# Patient Record
Sex: Female | Born: 1950 | Race: White | Hispanic: No | Marital: Married | Smoking: Never smoker
Health system: Southern US, Community
[De-identification: ages and names within clinical notes are randomized; demographics above are authoritative.]

## PROBLEM LIST (undated history)

## (undated) DIAGNOSIS — E785 Hyperlipidemia, unspecified: Secondary | ICD-10-CM

## (undated) DIAGNOSIS — K5790 Diverticulosis of intestine, part unspecified, without perforation or abscess without bleeding: Secondary | ICD-10-CM

## (undated) DIAGNOSIS — F419 Anxiety disorder, unspecified: Secondary | ICD-10-CM

## (undated) DIAGNOSIS — F329 Major depressive disorder, single episode, unspecified: Secondary | ICD-10-CM

## (undated) DIAGNOSIS — T7840XA Allergy, unspecified, initial encounter: Secondary | ICD-10-CM

## (undated) DIAGNOSIS — K219 Gastro-esophageal reflux disease without esophagitis: Secondary | ICD-10-CM

## (undated) DIAGNOSIS — I10 Essential (primary) hypertension: Secondary | ICD-10-CM

## (undated) DIAGNOSIS — Z01419 Encounter for gynecological examination (general) (routine) without abnormal findings: Secondary | ICD-10-CM

## (undated) DIAGNOSIS — K589 Irritable bowel syndrome without diarrhea: Secondary | ICD-10-CM

## (undated) DIAGNOSIS — K649 Unspecified hemorrhoids: Secondary | ICD-10-CM

## (undated) DIAGNOSIS — K635 Polyp of colon: Secondary | ICD-10-CM

## (undated) DIAGNOSIS — F32A Depression, unspecified: Secondary | ICD-10-CM

## (undated) DIAGNOSIS — H9190 Unspecified hearing loss, unspecified ear: Secondary | ICD-10-CM

## (undated) HISTORY — DX: Gastro-esophageal reflux disease without esophagitis: K21.9

## (undated) HISTORY — DX: Allergy, unspecified, initial encounter: T78.40XA

## (undated) HISTORY — DX: Major depressive disorder, single episode, unspecified: F32.9

## (undated) HISTORY — DX: Hyperlipidemia, unspecified: E78.5

## (undated) HISTORY — DX: Encounter for gynecological examination (general) (routine) without abnormal findings: Z01.419

## (undated) HISTORY — DX: Irritable bowel syndrome, unspecified: K58.9

## (undated) HISTORY — DX: Polyp of colon: K63.5

## (undated) HISTORY — DX: Essential (primary) hypertension: I10

## (undated) HISTORY — DX: Unspecified hemorrhoids: K64.9

## (undated) HISTORY — DX: Diverticulosis of intestine, part unspecified, without perforation or abscess without bleeding: K57.90

## (undated) HISTORY — DX: Anxiety disorder, unspecified: F41.9

## (undated) HISTORY — DX: Unspecified hearing loss, unspecified ear: H91.90

## (undated) HISTORY — DX: Depression, unspecified: F32.A

## (undated) HISTORY — PX: KNEE SURGERY: SHX244

---

## 1978-11-09 HISTORY — PX: CARPAL TUNNEL RELEASE: SHX101

## 1998-09-30 ENCOUNTER — Other Ambulatory Visit: Admission: RE | Admit: 1998-09-30 | Discharge: 1998-09-30 | Payer: Self-pay | Admitting: Obstetrics & Gynecology

## 1999-06-11 ENCOUNTER — Other Ambulatory Visit: Admission: RE | Admit: 1999-06-11 | Discharge: 1999-06-11 | Payer: Self-pay | Admitting: Obstetrics and Gynecology

## 1999-06-12 ENCOUNTER — Other Ambulatory Visit: Admission: RE | Admit: 1999-06-12 | Discharge: 1999-06-12 | Payer: Self-pay | Admitting: Obstetrics and Gynecology

## 1999-12-02 ENCOUNTER — Ambulatory Visit (HOSPITAL_COMMUNITY): Admission: RE | Admit: 1999-12-02 | Discharge: 1999-12-02 | Payer: Self-pay | Admitting: Obstetrics and Gynecology

## 1999-12-02 ENCOUNTER — Encounter: Payer: Self-pay | Admitting: Obstetrics and Gynecology

## 1999-12-11 ENCOUNTER — Ambulatory Visit (HOSPITAL_COMMUNITY): Admission: RE | Admit: 1999-12-11 | Discharge: 1999-12-11 | Payer: Self-pay | Admitting: Obstetrics and Gynecology

## 2001-01-14 ENCOUNTER — Other Ambulatory Visit: Admission: RE | Admit: 2001-01-14 | Discharge: 2001-01-14 | Payer: Self-pay | Admitting: Obstetrics and Gynecology

## 2002-02-08 ENCOUNTER — Other Ambulatory Visit: Admission: RE | Admit: 2002-02-08 | Discharge: 2002-02-08 | Payer: Self-pay | Admitting: Obstetrics and Gynecology

## 2003-04-04 ENCOUNTER — Other Ambulatory Visit: Admission: RE | Admit: 2003-04-04 | Discharge: 2003-04-04 | Payer: Self-pay | Admitting: Obstetrics and Gynecology

## 2003-11-10 HISTORY — PX: DILATION AND CURETTAGE OF UTERUS: SHX78

## 2004-01-22 ENCOUNTER — Encounter: Admission: RE | Admit: 2004-01-22 | Discharge: 2004-01-22 | Payer: Self-pay | Admitting: Specialist

## 2004-04-04 ENCOUNTER — Other Ambulatory Visit: Admission: RE | Admit: 2004-04-04 | Discharge: 2004-04-04 | Payer: Self-pay | Admitting: Obstetrics and Gynecology

## 2004-11-12 ENCOUNTER — Ambulatory Visit: Payer: Self-pay | Admitting: Internal Medicine

## 2004-11-18 ENCOUNTER — Ambulatory Visit: Payer: Self-pay | Admitting: Internal Medicine

## 2004-11-24 ENCOUNTER — Ambulatory Visit: Payer: Self-pay | Admitting: Internal Medicine

## 2004-12-02 ENCOUNTER — Ambulatory Visit: Payer: Self-pay | Admitting: Internal Medicine

## 2004-12-09 ENCOUNTER — Ambulatory Visit: Payer: Self-pay | Admitting: Internal Medicine

## 2004-12-15 ENCOUNTER — Ambulatory Visit: Payer: Self-pay | Admitting: Internal Medicine

## 2004-12-22 ENCOUNTER — Ambulatory Visit: Payer: Self-pay | Admitting: Internal Medicine

## 2004-12-30 ENCOUNTER — Ambulatory Visit: Payer: Self-pay | Admitting: Internal Medicine

## 2005-01-06 ENCOUNTER — Ambulatory Visit: Payer: Self-pay | Admitting: Internal Medicine

## 2005-02-10 ENCOUNTER — Ambulatory Visit: Payer: Self-pay | Admitting: Internal Medicine

## 2005-02-16 ENCOUNTER — Ambulatory Visit: Payer: Self-pay | Admitting: Internal Medicine

## 2005-02-24 ENCOUNTER — Ambulatory Visit: Payer: Self-pay | Admitting: Internal Medicine

## 2005-03-02 ENCOUNTER — Ambulatory Visit: Payer: Self-pay | Admitting: Internal Medicine

## 2005-03-09 ENCOUNTER — Ambulatory Visit: Payer: Self-pay | Admitting: Internal Medicine

## 2005-03-16 ENCOUNTER — Ambulatory Visit: Payer: Self-pay | Admitting: Internal Medicine

## 2005-03-24 ENCOUNTER — Ambulatory Visit: Payer: Self-pay | Admitting: Internal Medicine

## 2005-03-30 ENCOUNTER — Ambulatory Visit: Payer: Self-pay | Admitting: Internal Medicine

## 2005-04-07 ENCOUNTER — Ambulatory Visit: Payer: Self-pay | Admitting: Internal Medicine

## 2005-04-13 ENCOUNTER — Ambulatory Visit: Payer: Self-pay | Admitting: Internal Medicine

## 2005-04-20 ENCOUNTER — Ambulatory Visit: Payer: Self-pay | Admitting: Internal Medicine

## 2005-04-27 ENCOUNTER — Ambulatory Visit: Payer: Self-pay | Admitting: Internal Medicine

## 2005-05-05 ENCOUNTER — Ambulatory Visit: Payer: Self-pay | Admitting: Internal Medicine

## 2005-05-13 ENCOUNTER — Ambulatory Visit: Payer: Self-pay | Admitting: Internal Medicine

## 2005-05-18 ENCOUNTER — Ambulatory Visit: Payer: Self-pay | Admitting: Internal Medicine

## 2005-05-25 ENCOUNTER — Ambulatory Visit: Payer: Self-pay | Admitting: Internal Medicine

## 2005-05-26 ENCOUNTER — Ambulatory Visit: Payer: Self-pay | Admitting: Internal Medicine

## 2005-06-01 ENCOUNTER — Ambulatory Visit: Payer: Self-pay | Admitting: Internal Medicine

## 2005-06-02 ENCOUNTER — Emergency Department (HOSPITAL_COMMUNITY): Admission: EM | Admit: 2005-06-02 | Discharge: 2005-06-02 | Payer: Self-pay | Admitting: Emergency Medicine

## 2005-06-08 ENCOUNTER — Ambulatory Visit: Payer: Self-pay | Admitting: Internal Medicine

## 2005-06-15 ENCOUNTER — Ambulatory Visit: Payer: Self-pay | Admitting: Pulmonary Disease

## 2005-06-15 ENCOUNTER — Ambulatory Visit: Payer: Self-pay | Admitting: Internal Medicine

## 2005-06-16 ENCOUNTER — Other Ambulatory Visit: Admission: RE | Admit: 2005-06-16 | Discharge: 2005-06-16 | Payer: Self-pay | Admitting: Obstetrics and Gynecology

## 2005-06-23 ENCOUNTER — Ambulatory Visit: Payer: Self-pay | Admitting: Internal Medicine

## 2005-07-02 ENCOUNTER — Ambulatory Visit: Payer: Self-pay | Admitting: Internal Medicine

## 2005-07-06 ENCOUNTER — Ambulatory Visit: Payer: Self-pay | Admitting: Internal Medicine

## 2005-07-14 ENCOUNTER — Ambulatory Visit: Payer: Self-pay | Admitting: Internal Medicine

## 2005-07-20 ENCOUNTER — Ambulatory Visit: Payer: Self-pay | Admitting: Internal Medicine

## 2005-07-27 ENCOUNTER — Ambulatory Visit: Payer: Self-pay | Admitting: Internal Medicine

## 2005-08-03 ENCOUNTER — Ambulatory Visit: Payer: Self-pay | Admitting: Internal Medicine

## 2005-08-17 ENCOUNTER — Ambulatory Visit: Payer: Self-pay | Admitting: Internal Medicine

## 2005-08-24 ENCOUNTER — Ambulatory Visit: Payer: Self-pay | Admitting: Internal Medicine

## 2005-08-31 ENCOUNTER — Ambulatory Visit: Payer: Self-pay | Admitting: Internal Medicine

## 2005-09-07 ENCOUNTER — Ambulatory Visit: Payer: Self-pay | Admitting: Internal Medicine

## 2005-09-14 ENCOUNTER — Ambulatory Visit: Payer: Self-pay | Admitting: Internal Medicine

## 2005-09-21 ENCOUNTER — Ambulatory Visit: Payer: Self-pay | Admitting: Internal Medicine

## 2005-09-28 ENCOUNTER — Ambulatory Visit: Payer: Self-pay | Admitting: Internal Medicine

## 2005-10-05 ENCOUNTER — Ambulatory Visit: Payer: Self-pay | Admitting: Internal Medicine

## 2005-10-12 ENCOUNTER — Ambulatory Visit: Payer: Self-pay | Admitting: Internal Medicine

## 2005-11-05 ENCOUNTER — Ambulatory Visit: Payer: Self-pay | Admitting: Internal Medicine

## 2005-11-09 DIAGNOSIS — K635 Polyp of colon: Secondary | ICD-10-CM

## 2005-11-09 HISTORY — DX: Polyp of colon: K63.5

## 2005-11-10 ENCOUNTER — Ambulatory Visit: Payer: Self-pay | Admitting: Internal Medicine

## 2005-11-16 ENCOUNTER — Ambulatory Visit: Payer: Self-pay | Admitting: Internal Medicine

## 2005-11-23 ENCOUNTER — Ambulatory Visit: Payer: Self-pay | Admitting: Internal Medicine

## 2005-11-30 ENCOUNTER — Ambulatory Visit: Payer: Self-pay | Admitting: Internal Medicine

## 2005-12-07 ENCOUNTER — Ambulatory Visit: Payer: Self-pay | Admitting: Internal Medicine

## 2005-12-14 ENCOUNTER — Ambulatory Visit: Payer: Self-pay | Admitting: Internal Medicine

## 2005-12-21 ENCOUNTER — Ambulatory Visit: Payer: Self-pay | Admitting: Internal Medicine

## 2005-12-28 ENCOUNTER — Ambulatory Visit: Payer: Self-pay | Admitting: Internal Medicine

## 2006-01-04 ENCOUNTER — Ambulatory Visit: Payer: Self-pay | Admitting: Internal Medicine

## 2006-01-11 ENCOUNTER — Ambulatory Visit: Payer: Self-pay | Admitting: Internal Medicine

## 2006-01-18 ENCOUNTER — Ambulatory Visit: Payer: Self-pay | Admitting: Internal Medicine

## 2006-01-25 ENCOUNTER — Ambulatory Visit: Payer: Self-pay | Admitting: Internal Medicine

## 2006-01-26 ENCOUNTER — Ambulatory Visit: Payer: Self-pay | Admitting: Internal Medicine

## 2006-02-01 ENCOUNTER — Ambulatory Visit: Payer: Self-pay | Admitting: Internal Medicine

## 2006-02-03 ENCOUNTER — Ambulatory Visit: Payer: Self-pay | Admitting: Internal Medicine

## 2006-02-08 ENCOUNTER — Ambulatory Visit: Payer: Self-pay | Admitting: Internal Medicine

## 2006-02-15 ENCOUNTER — Ambulatory Visit: Payer: Self-pay | Admitting: Internal Medicine

## 2006-02-24 ENCOUNTER — Ambulatory Visit: Payer: Self-pay | Admitting: Family Medicine

## 2006-02-24 ENCOUNTER — Ambulatory Visit: Payer: Self-pay | Admitting: Internal Medicine

## 2006-03-01 ENCOUNTER — Ambulatory Visit: Payer: Self-pay | Admitting: Internal Medicine

## 2006-03-15 ENCOUNTER — Ambulatory Visit: Payer: Self-pay | Admitting: Internal Medicine

## 2006-03-22 ENCOUNTER — Ambulatory Visit: Payer: Self-pay | Admitting: Internal Medicine

## 2006-03-29 ENCOUNTER — Ambulatory Visit: Payer: Self-pay | Admitting: Internal Medicine

## 2006-04-06 ENCOUNTER — Ambulatory Visit: Payer: Self-pay | Admitting: Internal Medicine

## 2006-04-12 ENCOUNTER — Ambulatory Visit: Payer: Self-pay | Admitting: Internal Medicine

## 2006-04-14 ENCOUNTER — Ambulatory Visit: Payer: Self-pay | Admitting: Family Medicine

## 2006-04-19 ENCOUNTER — Ambulatory Visit: Payer: Self-pay | Admitting: Internal Medicine

## 2006-04-26 ENCOUNTER — Ambulatory Visit: Payer: Self-pay | Admitting: Internal Medicine

## 2006-05-03 ENCOUNTER — Ambulatory Visit: Payer: Self-pay | Admitting: Internal Medicine

## 2006-05-10 ENCOUNTER — Ambulatory Visit: Payer: Self-pay | Admitting: Internal Medicine

## 2006-05-17 ENCOUNTER — Ambulatory Visit: Payer: Self-pay | Admitting: Internal Medicine

## 2006-05-24 ENCOUNTER — Ambulatory Visit: Payer: Self-pay | Admitting: Internal Medicine

## 2006-05-25 ENCOUNTER — Ambulatory Visit: Payer: Self-pay | Admitting: Internal Medicine

## 2006-05-31 ENCOUNTER — Ambulatory Visit: Payer: Self-pay | Admitting: Internal Medicine

## 2006-06-07 ENCOUNTER — Ambulatory Visit: Payer: Self-pay | Admitting: Internal Medicine

## 2006-06-14 ENCOUNTER — Ambulatory Visit: Payer: Self-pay | Admitting: Internal Medicine

## 2006-06-21 ENCOUNTER — Ambulatory Visit: Payer: Self-pay | Admitting: Internal Medicine

## 2006-06-28 ENCOUNTER — Ambulatory Visit: Payer: Self-pay | Admitting: Internal Medicine

## 2006-07-05 ENCOUNTER — Ambulatory Visit: Payer: Self-pay | Admitting: Internal Medicine

## 2006-07-13 ENCOUNTER — Ambulatory Visit: Payer: Self-pay | Admitting: Internal Medicine

## 2006-07-19 ENCOUNTER — Ambulatory Visit: Payer: Self-pay | Admitting: Internal Medicine

## 2006-07-20 ENCOUNTER — Emergency Department (HOSPITAL_COMMUNITY): Admission: EM | Admit: 2006-07-20 | Discharge: 2006-07-20 | Payer: Self-pay | Admitting: Emergency Medicine

## 2006-07-26 ENCOUNTER — Ambulatory Visit: Payer: Self-pay | Admitting: Internal Medicine

## 2006-08-02 ENCOUNTER — Ambulatory Visit: Payer: Self-pay | Admitting: Internal Medicine

## 2006-08-09 ENCOUNTER — Ambulatory Visit: Payer: Self-pay | Admitting: Internal Medicine

## 2006-08-16 ENCOUNTER — Ambulatory Visit: Payer: Self-pay | Admitting: Internal Medicine

## 2006-08-17 ENCOUNTER — Ambulatory Visit: Payer: Self-pay | Admitting: Family Medicine

## 2006-08-19 ENCOUNTER — Ambulatory Visit: Payer: Self-pay | Admitting: Family Medicine

## 2006-08-19 LAB — CONVERTED CEMR LAB
BUN: 15 mg/dL (ref 6–23)
Basophils Absolute: 0.1 10*3/uL (ref 0.0–0.1)
CO2: 29 meq/L (ref 19–32)
Calcium: 9.2 mg/dL (ref 8.4–10.5)
Chloride: 108 meq/L (ref 96–112)
Chol/HDL Ratio, serum: 5.1
Cholesterol: 193 mg/dL (ref 0–200)
Creatinine, Ser: 0.9 mg/dL (ref 0.4–1.2)
Hemoglobin: 12.7 g/dL (ref 12.0–15.0)
LDL Cholesterol: 120 mg/dL — ABNORMAL HIGH (ref 0–99)
Lymphocytes Relative: 25.3 % (ref 12.0–46.0)
MCHC: 33.9 g/dL (ref 30.0–36.0)
MCV: 85.7 fL (ref 78.0–100.0)
Monocytes Absolute: 0.4 10*3/uL (ref 0.2–0.7)
Monocytes Relative: 6.9 % (ref 3.0–11.0)
Neutrophils Relative %: 63.4 % (ref 43.0–77.0)
Potassium: 3.4 meq/L — ABNORMAL LOW (ref 3.5–5.1)
TSH: 2.03 microintl units/mL (ref 0.35–5.50)
Triglyceride fasting, serum: 173 mg/dL — ABNORMAL HIGH (ref 0–149)

## 2006-08-23 ENCOUNTER — Ambulatory Visit: Payer: Self-pay | Admitting: Internal Medicine

## 2006-08-26 ENCOUNTER — Ambulatory Visit: Payer: Self-pay | Admitting: Family Medicine

## 2006-08-30 ENCOUNTER — Ambulatory Visit: Payer: Self-pay | Admitting: Internal Medicine

## 2006-09-06 ENCOUNTER — Ambulatory Visit: Payer: Self-pay | Admitting: Internal Medicine

## 2006-09-13 ENCOUNTER — Ambulatory Visit: Payer: Self-pay | Admitting: Internal Medicine

## 2006-09-14 ENCOUNTER — Ambulatory Visit: Payer: Self-pay | Admitting: Internal Medicine

## 2006-09-20 ENCOUNTER — Ambulatory Visit: Payer: Self-pay | Admitting: Internal Medicine

## 2006-09-27 ENCOUNTER — Ambulatory Visit: Payer: Self-pay | Admitting: Internal Medicine

## 2006-10-07 ENCOUNTER — Ambulatory Visit: Payer: Self-pay | Admitting: Internal Medicine

## 2006-10-11 ENCOUNTER — Ambulatory Visit: Payer: Self-pay | Admitting: Internal Medicine

## 2006-10-18 ENCOUNTER — Ambulatory Visit: Payer: Self-pay | Admitting: Internal Medicine

## 2006-10-25 ENCOUNTER — Ambulatory Visit: Payer: Self-pay | Admitting: Internal Medicine

## 2006-11-03 ENCOUNTER — Ambulatory Visit: Payer: Self-pay | Admitting: Internal Medicine

## 2006-11-08 ENCOUNTER — Ambulatory Visit: Payer: Self-pay | Admitting: Internal Medicine

## 2006-11-15 ENCOUNTER — Ambulatory Visit: Payer: Self-pay | Admitting: Internal Medicine

## 2006-11-22 ENCOUNTER — Ambulatory Visit: Payer: Self-pay | Admitting: Internal Medicine

## 2006-11-29 ENCOUNTER — Ambulatory Visit: Payer: Self-pay | Admitting: Internal Medicine

## 2006-12-06 ENCOUNTER — Ambulatory Visit: Payer: Self-pay | Admitting: Internal Medicine

## 2006-12-07 ENCOUNTER — Ambulatory Visit: Payer: Self-pay | Admitting: Family Medicine

## 2006-12-10 ENCOUNTER — Ambulatory Visit: Payer: Self-pay | Admitting: Gastroenterology

## 2006-12-13 ENCOUNTER — Ambulatory Visit: Payer: Self-pay | Admitting: Internal Medicine

## 2006-12-20 ENCOUNTER — Ambulatory Visit: Payer: Self-pay | Admitting: Internal Medicine

## 2006-12-24 ENCOUNTER — Ambulatory Visit: Payer: Self-pay | Admitting: Family Medicine

## 2006-12-27 ENCOUNTER — Ambulatory Visit: Payer: Self-pay | Admitting: Internal Medicine

## 2006-12-28 ENCOUNTER — Ambulatory Visit: Payer: Self-pay | Admitting: Internal Medicine

## 2007-01-03 ENCOUNTER — Ambulatory Visit: Payer: Self-pay | Admitting: Internal Medicine

## 2007-01-10 ENCOUNTER — Ambulatory Visit: Payer: Self-pay | Admitting: Internal Medicine

## 2007-01-17 ENCOUNTER — Ambulatory Visit: Payer: Self-pay | Admitting: Internal Medicine

## 2007-01-24 ENCOUNTER — Ambulatory Visit: Payer: Self-pay | Admitting: Internal Medicine

## 2007-02-14 ENCOUNTER — Ambulatory Visit: Payer: Self-pay | Admitting: Internal Medicine

## 2007-02-21 ENCOUNTER — Ambulatory Visit: Payer: Self-pay | Admitting: Internal Medicine

## 2007-02-28 ENCOUNTER — Ambulatory Visit: Payer: Self-pay | Admitting: Internal Medicine

## 2007-03-07 ENCOUNTER — Ambulatory Visit: Payer: Self-pay | Admitting: Internal Medicine

## 2007-03-14 ENCOUNTER — Ambulatory Visit: Payer: Self-pay | Admitting: Internal Medicine

## 2007-03-21 ENCOUNTER — Ambulatory Visit: Payer: Self-pay | Admitting: Internal Medicine

## 2007-03-30 ENCOUNTER — Ambulatory Visit: Payer: Self-pay | Admitting: Internal Medicine

## 2007-04-05 ENCOUNTER — Ambulatory Visit: Payer: Self-pay | Admitting: Internal Medicine

## 2007-04-11 ENCOUNTER — Ambulatory Visit: Payer: Self-pay | Admitting: Internal Medicine

## 2007-04-25 ENCOUNTER — Ambulatory Visit: Payer: Self-pay | Admitting: Internal Medicine

## 2007-05-02 ENCOUNTER — Ambulatory Visit: Payer: Self-pay | Admitting: Internal Medicine

## 2007-05-03 ENCOUNTER — Ambulatory Visit: Payer: Self-pay | Admitting: Internal Medicine

## 2007-05-06 ENCOUNTER — Ambulatory Visit: Payer: Self-pay | Admitting: Family Medicine

## 2007-05-09 ENCOUNTER — Ambulatory Visit: Payer: Self-pay | Admitting: Internal Medicine

## 2007-05-16 ENCOUNTER — Ambulatory Visit: Payer: Self-pay | Admitting: Internal Medicine

## 2007-05-23 ENCOUNTER — Ambulatory Visit: Payer: Self-pay | Admitting: Internal Medicine

## 2007-05-23 DIAGNOSIS — K219 Gastro-esophageal reflux disease without esophagitis: Secondary | ICD-10-CM | POA: Insufficient documentation

## 2007-05-23 DIAGNOSIS — J309 Allergic rhinitis, unspecified: Secondary | ICD-10-CM | POA: Insufficient documentation

## 2007-05-23 DIAGNOSIS — J4520 Mild intermittent asthma, uncomplicated: Secondary | ICD-10-CM | POA: Insufficient documentation

## 2007-05-23 DIAGNOSIS — I10 Essential (primary) hypertension: Secondary | ICD-10-CM | POA: Insufficient documentation

## 2007-06-06 ENCOUNTER — Ambulatory Visit: Payer: Self-pay | Admitting: Internal Medicine

## 2007-06-14 ENCOUNTER — Telehealth: Payer: Self-pay | Admitting: Family Medicine

## 2007-06-15 ENCOUNTER — Ambulatory Visit: Payer: Self-pay | Admitting: Family Medicine

## 2007-06-20 ENCOUNTER — Ambulatory Visit: Payer: Self-pay | Admitting: Internal Medicine

## 2007-07-04 ENCOUNTER — Ambulatory Visit: Payer: Self-pay | Admitting: Internal Medicine

## 2007-07-04 ENCOUNTER — Ambulatory Visit: Payer: Self-pay | Admitting: Family Medicine

## 2007-07-12 ENCOUNTER — Ambulatory Visit: Payer: Self-pay | Admitting: Internal Medicine

## 2007-07-18 ENCOUNTER — Ambulatory Visit: Payer: Self-pay | Admitting: Internal Medicine

## 2007-07-25 ENCOUNTER — Ambulatory Visit: Payer: Self-pay | Admitting: Internal Medicine

## 2007-08-01 ENCOUNTER — Ambulatory Visit: Payer: Self-pay | Admitting: Internal Medicine

## 2007-08-08 ENCOUNTER — Ambulatory Visit: Payer: Self-pay | Admitting: Cardiovascular Disease

## 2007-08-08 ENCOUNTER — Ambulatory Visit: Payer: Self-pay | Admitting: Family Medicine

## 2007-08-11 ENCOUNTER — Telehealth: Payer: Self-pay | Admitting: Family Medicine

## 2007-08-15 ENCOUNTER — Ambulatory Visit: Payer: Self-pay | Admitting: Internal Medicine

## 2007-08-22 ENCOUNTER — Ambulatory Visit: Payer: Self-pay | Admitting: Internal Medicine

## 2007-09-05 ENCOUNTER — Ambulatory Visit: Payer: Self-pay | Admitting: Internal Medicine

## 2007-09-12 ENCOUNTER — Ambulatory Visit: Payer: Self-pay | Admitting: Internal Medicine

## 2007-09-19 ENCOUNTER — Ambulatory Visit: Payer: Self-pay | Admitting: Internal Medicine

## 2007-09-20 ENCOUNTER — Ambulatory Visit: Payer: Self-pay | Admitting: Internal Medicine

## 2007-09-26 ENCOUNTER — Telehealth: Payer: Self-pay | Admitting: Family Medicine

## 2007-10-03 ENCOUNTER — Ambulatory Visit: Payer: Self-pay | Admitting: Internal Medicine

## 2007-10-12 ENCOUNTER — Ambulatory Visit: Payer: Self-pay | Admitting: Internal Medicine

## 2007-10-12 ENCOUNTER — Encounter: Payer: Self-pay | Admitting: Internal Medicine

## 2007-10-25 ENCOUNTER — Ambulatory Visit: Payer: Self-pay | Admitting: Family Medicine

## 2007-11-14 ENCOUNTER — Ambulatory Visit: Payer: Self-pay | Admitting: Internal Medicine

## 2007-11-28 ENCOUNTER — Ambulatory Visit: Payer: Self-pay | Admitting: Internal Medicine

## 2007-12-13 ENCOUNTER — Ambulatory Visit: Payer: Self-pay | Admitting: Internal Medicine

## 2007-12-17 ENCOUNTER — Emergency Department (HOSPITAL_COMMUNITY): Admission: EM | Admit: 2007-12-17 | Discharge: 2007-12-17 | Payer: Self-pay | Admitting: Family Medicine

## 2007-12-19 ENCOUNTER — Telehealth: Payer: Self-pay | Admitting: Family Medicine

## 2007-12-22 ENCOUNTER — Telehealth: Payer: Self-pay | Admitting: Family Medicine

## 2007-12-27 ENCOUNTER — Ambulatory Visit: Payer: Self-pay | Admitting: Internal Medicine

## 2007-12-30 ENCOUNTER — Ambulatory Visit: Payer: Self-pay | Admitting: Family Medicine

## 2007-12-30 ENCOUNTER — Telehealth (INDEPENDENT_AMBULATORY_CARE_PROVIDER_SITE_OTHER): Payer: Self-pay | Admitting: *Deleted

## 2008-01-19 ENCOUNTER — Ambulatory Visit: Payer: Self-pay | Admitting: Internal Medicine

## 2008-01-26 ENCOUNTER — Telehealth (INDEPENDENT_AMBULATORY_CARE_PROVIDER_SITE_OTHER): Payer: Self-pay | Admitting: *Deleted

## 2008-01-27 ENCOUNTER — Ambulatory Visit: Payer: Self-pay | Admitting: Internal Medicine

## 2008-02-08 ENCOUNTER — Ambulatory Visit: Payer: Self-pay | Admitting: Family Medicine

## 2008-02-08 DIAGNOSIS — R51 Headache: Secondary | ICD-10-CM | POA: Insufficient documentation

## 2008-02-08 DIAGNOSIS — R209 Unspecified disturbances of skin sensation: Secondary | ICD-10-CM | POA: Insufficient documentation

## 2008-02-08 DIAGNOSIS — R42 Dizziness and giddiness: Secondary | ICD-10-CM | POA: Insufficient documentation

## 2008-02-08 DIAGNOSIS — R519 Headache, unspecified: Secondary | ICD-10-CM | POA: Insufficient documentation

## 2008-02-10 ENCOUNTER — Ambulatory Visit: Payer: Self-pay | Admitting: Family Medicine

## 2008-02-14 ENCOUNTER — Encounter: Admission: RE | Admit: 2008-02-14 | Discharge: 2008-02-14 | Payer: Self-pay | Admitting: Family Medicine

## 2008-02-14 LAB — CONVERTED CEMR LAB
BUN: 18 mg/dL (ref 6–23)
GFR calc Af Amer: 84 mL/min
Glucose, Bld: 100 mg/dL — ABNORMAL HIGH (ref 70–99)
Potassium: 3.9 meq/L (ref 3.5–5.1)

## 2008-02-15 ENCOUNTER — Ambulatory Visit: Payer: Self-pay | Admitting: Internal Medicine

## 2008-02-24 ENCOUNTER — Ambulatory Visit: Payer: Self-pay | Admitting: Internal Medicine

## 2008-03-08 ENCOUNTER — Ambulatory Visit: Payer: Self-pay | Admitting: Internal Medicine

## 2008-03-13 ENCOUNTER — Emergency Department (HOSPITAL_COMMUNITY): Admission: EM | Admit: 2008-03-13 | Discharge: 2008-03-13 | Payer: Self-pay | Admitting: Emergency Medicine

## 2008-03-15 ENCOUNTER — Ambulatory Visit: Payer: Self-pay | Admitting: Family Medicine

## 2008-03-16 ENCOUNTER — Ambulatory Visit: Payer: Self-pay | Admitting: Internal Medicine

## 2008-03-23 ENCOUNTER — Ambulatory Visit: Payer: Self-pay | Admitting: Internal Medicine

## 2008-03-30 ENCOUNTER — Ambulatory Visit: Payer: Self-pay | Admitting: Internal Medicine

## 2008-04-06 ENCOUNTER — Ambulatory Visit: Payer: Self-pay | Admitting: Internal Medicine

## 2008-04-11 ENCOUNTER — Ambulatory Visit: Payer: Self-pay | Admitting: Internal Medicine

## 2008-04-24 ENCOUNTER — Ambulatory Visit: Payer: Self-pay | Admitting: Internal Medicine

## 2008-04-25 ENCOUNTER — Ambulatory Visit: Payer: Self-pay | Admitting: Internal Medicine

## 2008-05-04 ENCOUNTER — Ambulatory Visit: Payer: Self-pay | Admitting: Internal Medicine

## 2008-05-10 ENCOUNTER — Ambulatory Visit: Payer: Self-pay | Admitting: Internal Medicine

## 2008-05-28 ENCOUNTER — Ambulatory Visit: Payer: Self-pay | Admitting: Internal Medicine

## 2008-06-08 ENCOUNTER — Ambulatory Visit: Payer: Self-pay | Admitting: Internal Medicine

## 2008-06-15 ENCOUNTER — Ambulatory Visit: Payer: Self-pay | Admitting: Internal Medicine

## 2008-06-22 ENCOUNTER — Ambulatory Visit: Payer: Self-pay | Admitting: Internal Medicine

## 2008-07-13 ENCOUNTER — Ambulatory Visit: Payer: Self-pay | Admitting: Internal Medicine

## 2008-07-20 ENCOUNTER — Ambulatory Visit: Payer: Self-pay | Admitting: Internal Medicine

## 2008-07-27 ENCOUNTER — Ambulatory Visit: Payer: Self-pay | Admitting: Internal Medicine

## 2008-08-03 ENCOUNTER — Ambulatory Visit: Payer: Self-pay | Admitting: Internal Medicine

## 2008-08-10 ENCOUNTER — Ambulatory Visit: Payer: Self-pay | Admitting: Internal Medicine

## 2008-08-17 ENCOUNTER — Ambulatory Visit: Payer: Self-pay | Admitting: Internal Medicine

## 2008-08-24 ENCOUNTER — Ambulatory Visit: Payer: Self-pay | Admitting: Internal Medicine

## 2008-09-07 ENCOUNTER — Ambulatory Visit: Payer: Self-pay | Admitting: Internal Medicine

## 2008-09-10 ENCOUNTER — Ambulatory Visit: Payer: Self-pay | Admitting: Internal Medicine

## 2008-09-11 ENCOUNTER — Ambulatory Visit: Payer: Self-pay | Admitting: Internal Medicine

## 2008-09-18 ENCOUNTER — Ambulatory Visit: Payer: Self-pay | Admitting: Internal Medicine

## 2008-09-28 ENCOUNTER — Ambulatory Visit: Payer: Self-pay | Admitting: Internal Medicine

## 2008-10-18 ENCOUNTER — Ambulatory Visit: Payer: Self-pay | Admitting: Internal Medicine

## 2008-11-05 ENCOUNTER — Ambulatory Visit: Payer: Self-pay | Admitting: Internal Medicine

## 2008-11-05 DIAGNOSIS — J040 Acute laryngitis: Secondary | ICD-10-CM | POA: Insufficient documentation

## 2008-11-16 ENCOUNTER — Telehealth: Payer: Self-pay | Admitting: *Deleted

## 2008-11-20 ENCOUNTER — Ambulatory Visit: Payer: Self-pay | Admitting: Internal Medicine

## 2008-11-20 ENCOUNTER — Telehealth: Payer: Self-pay | Admitting: Family Medicine

## 2008-11-20 ENCOUNTER — Ambulatory Visit: Payer: Self-pay | Admitting: Family Medicine

## 2008-11-20 DIAGNOSIS — F3289 Other specified depressive episodes: Secondary | ICD-10-CM | POA: Insufficient documentation

## 2008-11-20 DIAGNOSIS — F329 Major depressive disorder, single episode, unspecified: Secondary | ICD-10-CM

## 2008-11-21 LAB — CONVERTED CEMR LAB
AST: 15 units/L (ref 0–37)
Albumin: 3.7 g/dL (ref 3.5–5.2)
Alkaline Phosphatase: 63 units/L (ref 39–117)
BUN: 15 mg/dL (ref 6–23)
CO2: 27 meq/L (ref 19–32)
Chloride: 108 meq/L (ref 96–112)
Eosinophils Relative: 2.3 % (ref 0.0–5.0)
Glucose, Bld: 96 mg/dL (ref 70–99)
HCT: 37 % (ref 36.0–46.0)
HDL: 43.2 mg/dL (ref >39.0)
Monocytes Relative: 6.1 % (ref 3.0–12.0)
Neutrophils Relative %: 65.8 % (ref 43.0–77.0)
Platelets: 201 10*3/uL (ref 150–400)
Potassium: 3.9 meq/L (ref 3.5–5.1)
Total CHOL/HDL Ratio: 4.5
Total Protein: 6.6 g/dL (ref 6.0–8.3)
VLDL: 29 mg/dL (ref 0–40)
WBC: 6.8 10*3/uL (ref 4.5–10.5)

## 2008-11-30 ENCOUNTER — Telehealth: Payer: Self-pay | Admitting: Internal Medicine

## 2008-11-30 ENCOUNTER — Encounter: Payer: Self-pay | Admitting: Adult Health

## 2008-11-30 ENCOUNTER — Ambulatory Visit: Payer: Self-pay | Admitting: Internal Medicine

## 2008-12-03 ENCOUNTER — Telehealth (INDEPENDENT_AMBULATORY_CARE_PROVIDER_SITE_OTHER): Payer: Self-pay | Admitting: *Deleted

## 2008-12-04 DIAGNOSIS — J069 Acute upper respiratory infection, unspecified: Secondary | ICD-10-CM | POA: Insufficient documentation

## 2008-12-05 ENCOUNTER — Ambulatory Visit: Payer: Self-pay | Admitting: Family Medicine

## 2008-12-07 ENCOUNTER — Ambulatory Visit: Payer: Self-pay | Admitting: Internal Medicine

## 2008-12-13 ENCOUNTER — Ambulatory Visit: Payer: Self-pay | Admitting: Internal Medicine

## 2008-12-21 ENCOUNTER — Ambulatory Visit: Payer: Self-pay | Admitting: Internal Medicine

## 2008-12-28 ENCOUNTER — Ambulatory Visit: Payer: Self-pay | Admitting: Internal Medicine

## 2009-01-11 ENCOUNTER — Ambulatory Visit: Payer: Self-pay | Admitting: Internal Medicine

## 2009-01-18 ENCOUNTER — Ambulatory Visit: Payer: Self-pay | Admitting: Internal Medicine

## 2009-01-25 ENCOUNTER — Ambulatory Visit: Payer: Self-pay | Admitting: Internal Medicine

## 2009-02-01 ENCOUNTER — Ambulatory Visit: Payer: Self-pay | Admitting: Internal Medicine

## 2009-02-15 ENCOUNTER — Ambulatory Visit: Payer: Self-pay | Admitting: Internal Medicine

## 2009-02-22 ENCOUNTER — Ambulatory Visit: Payer: Self-pay | Admitting: Internal Medicine

## 2009-02-25 ENCOUNTER — Ambulatory Visit: Payer: Self-pay | Admitting: Internal Medicine

## 2009-03-08 ENCOUNTER — Ambulatory Visit: Payer: Self-pay | Admitting: Internal Medicine

## 2009-03-11 ENCOUNTER — Ambulatory Visit: Payer: Self-pay | Admitting: Internal Medicine

## 2009-03-29 ENCOUNTER — Ambulatory Visit: Payer: Self-pay | Admitting: Internal Medicine

## 2009-04-05 ENCOUNTER — Ambulatory Visit: Payer: Self-pay | Admitting: Internal Medicine

## 2009-04-12 ENCOUNTER — Ambulatory Visit: Payer: Self-pay | Admitting: Internal Medicine

## 2009-04-26 ENCOUNTER — Ambulatory Visit: Payer: Self-pay | Admitting: Internal Medicine

## 2009-05-03 ENCOUNTER — Ambulatory Visit: Payer: Self-pay | Admitting: Internal Medicine

## 2009-05-10 ENCOUNTER — Ambulatory Visit: Payer: Self-pay | Admitting: Internal Medicine

## 2009-05-15 ENCOUNTER — Telehealth: Payer: Self-pay | Admitting: Family Medicine

## 2009-05-17 ENCOUNTER — Ambulatory Visit: Payer: Self-pay | Admitting: Internal Medicine

## 2009-05-23 ENCOUNTER — Ambulatory Visit: Payer: Self-pay | Admitting: Internal Medicine

## 2009-05-27 ENCOUNTER — Telehealth (INDEPENDENT_AMBULATORY_CARE_PROVIDER_SITE_OTHER): Payer: Self-pay | Admitting: *Deleted

## 2009-05-29 ENCOUNTER — Telehealth (INDEPENDENT_AMBULATORY_CARE_PROVIDER_SITE_OTHER): Payer: Self-pay | Admitting: *Deleted

## 2009-05-31 ENCOUNTER — Ambulatory Visit: Payer: Self-pay | Admitting: Internal Medicine

## 2009-06-07 ENCOUNTER — Ambulatory Visit: Payer: Self-pay | Admitting: Internal Medicine

## 2009-06-12 ENCOUNTER — Ambulatory Visit: Payer: Self-pay | Admitting: Internal Medicine

## 2009-06-27 ENCOUNTER — Ambulatory Visit: Payer: Self-pay | Admitting: Internal Medicine

## 2009-06-27 LAB — HM COLONOSCOPY

## 2009-07-05 ENCOUNTER — Ambulatory Visit: Payer: Self-pay | Admitting: Internal Medicine

## 2009-07-12 ENCOUNTER — Ambulatory Visit: Payer: Self-pay | Admitting: Internal Medicine

## 2009-07-19 ENCOUNTER — Ambulatory Visit: Payer: Self-pay | Admitting: Internal Medicine

## 2009-07-26 ENCOUNTER — Ambulatory Visit: Payer: Self-pay | Admitting: Internal Medicine

## 2009-07-30 ENCOUNTER — Ambulatory Visit: Payer: Self-pay | Admitting: Internal Medicine

## 2009-07-30 ENCOUNTER — Telehealth: Payer: Self-pay | Admitting: Internal Medicine

## 2009-08-02 ENCOUNTER — Ambulatory Visit: Payer: Self-pay | Admitting: Internal Medicine

## 2009-08-09 ENCOUNTER — Ambulatory Visit: Payer: Self-pay | Admitting: Internal Medicine

## 2009-08-16 ENCOUNTER — Ambulatory Visit: Payer: Self-pay | Admitting: Internal Medicine

## 2009-08-23 ENCOUNTER — Ambulatory Visit: Payer: Self-pay | Admitting: Internal Medicine

## 2009-09-06 ENCOUNTER — Ambulatory Visit: Payer: Self-pay | Admitting: Internal Medicine

## 2009-09-09 ENCOUNTER — Ambulatory Visit: Payer: Self-pay | Admitting: Internal Medicine

## 2009-09-20 ENCOUNTER — Ambulatory Visit: Payer: Self-pay | Admitting: Internal Medicine

## 2009-10-11 ENCOUNTER — Ambulatory Visit: Payer: Self-pay | Admitting: Internal Medicine

## 2009-10-25 ENCOUNTER — Ambulatory Visit: Payer: Self-pay | Admitting: Internal Medicine

## 2009-11-05 ENCOUNTER — Ambulatory Visit: Payer: Self-pay | Admitting: Internal Medicine

## 2009-11-15 ENCOUNTER — Ambulatory Visit: Payer: Self-pay | Admitting: Internal Medicine

## 2009-11-26 ENCOUNTER — Ambulatory Visit: Payer: Self-pay | Admitting: Family Medicine

## 2009-11-26 LAB — CONVERTED CEMR LAB
Nitrite: NEGATIVE
Urobilinogen, UA: 0.2

## 2009-11-28 LAB — CONVERTED CEMR LAB
ALT: 18 units/L (ref 0–35)
AST: 19 units/L (ref 0–37)
Alkaline Phosphatase: 58 units/L (ref 39–117)
Basophils Relative: 0.6 % (ref 0.0–3.0)
Bilirubin, Direct: 0.1 mg/dL (ref 0.0–0.3)
Chloride: 111 meq/L (ref 96–112)
Creatinine, Ser: 0.9 mg/dL (ref 0.4–1.2)
Eosinophils Relative: 2.7 % (ref 0.0–5.0)
GFR calc non Af Amer: 68.65 mL/min (ref >60)
LDL Cholesterol: 118 mg/dL — ABNORMAL HIGH (ref 0–99)
Lymphocytes Relative: 29.4 % (ref 12.0–46.0)
MCV: 90.3 fL (ref 78.0–100.0)
Monocytes Absolute: 0.4 10*3/uL (ref 0.1–1.0)
Monocytes Relative: 5.8 % (ref 3.0–12.0)
Neutrophils Relative %: 61.5 % (ref 43.0–77.0)
RBC: 4.08 M/uL (ref 3.87–5.11)
Total Bilirubin: 0.7 mg/dL (ref 0.3–1.2)
Total CHOL/HDL Ratio: 5
Total Protein: 6.4 g/dL (ref 6.0–8.3)
Triglycerides: 199 mg/dL — ABNORMAL HIGH (ref 0.0–149.0)
VLDL: 39.8 mg/dL (ref 0.0–40.0)
WBC: 6.1 10*3/uL (ref 4.5–10.5)

## 2009-12-03 ENCOUNTER — Ambulatory Visit: Payer: Self-pay | Admitting: Family Medicine

## 2009-12-11 ENCOUNTER — Ambulatory Visit: Payer: Self-pay | Admitting: Family Medicine

## 2009-12-16 ENCOUNTER — Ambulatory Visit: Payer: Self-pay | Admitting: Internal Medicine

## 2009-12-27 ENCOUNTER — Ambulatory Visit: Payer: Self-pay | Admitting: Internal Medicine

## 2010-01-03 ENCOUNTER — Ambulatory Visit: Payer: Self-pay | Admitting: Internal Medicine

## 2010-01-10 ENCOUNTER — Ambulatory Visit: Payer: Self-pay | Admitting: Internal Medicine

## 2010-01-10 ENCOUNTER — Telehealth (INDEPENDENT_AMBULATORY_CARE_PROVIDER_SITE_OTHER): Payer: Self-pay | Admitting: *Deleted

## 2010-01-17 ENCOUNTER — Ambulatory Visit: Payer: Self-pay | Admitting: Internal Medicine

## 2010-01-24 ENCOUNTER — Ambulatory Visit: Payer: Self-pay | Admitting: Internal Medicine

## 2010-01-27 ENCOUNTER — Ambulatory Visit: Payer: Self-pay | Admitting: Internal Medicine

## 2010-01-31 ENCOUNTER — Ambulatory Visit: Payer: Self-pay | Admitting: Internal Medicine

## 2010-02-07 ENCOUNTER — Ambulatory Visit: Payer: Self-pay | Admitting: Internal Medicine

## 2010-02-21 ENCOUNTER — Ambulatory Visit: Payer: Self-pay | Admitting: Internal Medicine

## 2010-03-07 ENCOUNTER — Ambulatory Visit: Payer: Self-pay | Admitting: Internal Medicine

## 2010-03-10 ENCOUNTER — Ambulatory Visit: Payer: Self-pay | Admitting: Internal Medicine

## 2010-03-11 ENCOUNTER — Encounter: Payer: Self-pay | Admitting: Family Medicine

## 2010-03-21 ENCOUNTER — Ambulatory Visit: Payer: Self-pay | Admitting: Internal Medicine

## 2010-03-28 ENCOUNTER — Ambulatory Visit: Payer: Self-pay | Admitting: Internal Medicine

## 2010-04-03 ENCOUNTER — Ambulatory Visit: Payer: Self-pay | Admitting: Family Medicine

## 2010-04-07 ENCOUNTER — Encounter: Payer: Self-pay | Admitting: Family Medicine

## 2010-04-11 ENCOUNTER — Ambulatory Visit: Payer: Self-pay | Admitting: Internal Medicine

## 2010-05-02 ENCOUNTER — Ambulatory Visit: Payer: Self-pay | Admitting: Internal Medicine

## 2010-05-23 ENCOUNTER — Ambulatory Visit: Payer: Self-pay | Admitting: Internal Medicine

## 2010-05-30 ENCOUNTER — Ambulatory Visit: Payer: Self-pay | Admitting: Internal Medicine

## 2010-06-06 ENCOUNTER — Ambulatory Visit: Payer: Self-pay | Admitting: Internal Medicine

## 2010-06-20 ENCOUNTER — Ambulatory Visit: Payer: Self-pay | Admitting: Internal Medicine

## 2010-07-11 ENCOUNTER — Ambulatory Visit: Payer: Self-pay | Admitting: Internal Medicine

## 2010-07-18 ENCOUNTER — Ambulatory Visit: Payer: Self-pay | Admitting: Internal Medicine

## 2010-07-25 ENCOUNTER — Ambulatory Visit: Payer: Self-pay | Admitting: Internal Medicine

## 2010-08-07 ENCOUNTER — Telehealth: Payer: Self-pay

## 2010-08-22 ENCOUNTER — Ambulatory Visit: Payer: Self-pay | Admitting: Internal Medicine

## 2010-08-25 ENCOUNTER — Ambulatory Visit: Payer: Self-pay | Admitting: Family Medicine

## 2010-08-25 ENCOUNTER — Ambulatory Visit: Payer: Self-pay | Admitting: Internal Medicine

## 2010-09-05 ENCOUNTER — Ambulatory Visit: Payer: Self-pay | Admitting: Internal Medicine

## 2010-09-08 ENCOUNTER — Ambulatory Visit: Payer: Self-pay | Admitting: Internal Medicine

## 2010-09-19 ENCOUNTER — Ambulatory Visit: Payer: Self-pay | Admitting: Internal Medicine

## 2010-09-26 ENCOUNTER — Ambulatory Visit: Payer: Self-pay | Admitting: Internal Medicine

## 2010-11-04 ENCOUNTER — Ambulatory Visit: Payer: Self-pay | Admitting: Internal Medicine

## 2010-11-22 ENCOUNTER — Ambulatory Visit: Payer: Self-pay | Admitting: Internal Medicine

## 2010-12-11 NOTE — Assessment & Plan Note (Signed)
Summary: COUGH, CONGESTION, WHEEZING // RS   Vital Signs:  Patient profile:   60 year old female Temp:     98.0 degrees F oral Pulse rate:   81 / minute BP sitting:   106 / 62  (left arm) Cuff size:   large  Vitals Entered By: Alfred Levins, CMA (December 11, 2009 11:30 AM) CC: cough, runny nose x2 days   History of Present Illness: Here for 3 days of stuffy head, PND, ST, chest congestion, and a dry cough. No fever.   Current Medications (verified): 1)  Omeprazole 20 Mg Cpdr (Omeprazole) .... Once Daily 2)  Klor-Con M20 20 Meq Tbcr (Potassium Chloride Crys Cr) .Marland Kitchen.. 1 By Mouth Once Daily 3)  Lisinopril-Hydrochlorothiazide 20-12.5 Mg Tabs (Lisinopril-Hydrochlorothiazide) .Marland Kitchen.. 1 By Mouth Once Daily 4)  Zoloft 50 Mg  Tabs (Sertraline Hcl) .... Once Daily 5)  Bayer Aspirin Ec Low Dose 81 Mg  Tbec (Aspirin) .... Once Daily 6)  Symbicort 160-4.5 Mcg/act  Aero (Budesonide-Formoterol Fumarate) .... Inhale 2 Puffs Two Times A Day 7)  Proair Hfa 108 (90 Base) Mcg/act  Aers (Albuterol Sulfate) .... 2 Puffs Four Times A Day As Needed 8)  Allergy Vaccine Gh 1:10 .... Once A Week 9)  Macrobid 100 Mg Caps (Nitrofurantoin Monohyd Macro) .Marland Kitchen.. 1 By Mouth Two Times A Day  Allergies (verified): 1)  Doxycycline  Past History:  Past Medical History: Reviewed history from 12/03/2009 and no changes required. Allergic rhinitis Asthma, sees Dr. Jetty Duhamel GERD Hypertension Depression sees Dr. Huel Cote for GYN exams  Past Surgical History: Reviewed history from 12/03/2009 and no changes required. Carpal tunnel release-Right D&C x 2 colonoscopy 06-27-09 per Dr. [name], repeat in 5 yrs  Review of Systems  The patient denies anorexia, fever, weight loss, weight gain, vision loss, decreased hearing, hoarseness, chest pain, syncope, dyspnea on exertion, peripheral edema, hemoptysis, abdominal pain, melena, hematochezia, severe indigestion/heartburn, hematuria, incontinence, genital  sores, muscle weakness, suspicious skin lesions, transient blindness, difficulty walking, depression, unusual weight change, abnormal bleeding, enlarged lymph nodes, angioedema, breast masses, and testicular masses.    Physical Exam  General:  Well-developed,well-nourished,in no acute distress; alert,appropriate and cooperative throughout examination Head:  Normocephalic and atraumatic without obvious abnormalities. No apparent alopecia or balding. Eyes:  No corneal or conjunctival inflammation noted. EOMI. Perrla. Funduscopic exam benign, without hemorrhages, exudates or papilledema. Vision grossly normal. Ears:  External ear exam shows no significant lesions or deformities.  Otoscopic examination reveals clear canals, tympanic membranes are intact bilaterally without bulging, retraction, inflammation or discharge. Hearing is grossly normal bilaterally. Nose:  External nasal examination shows no deformity or inflammation. Nasal mucosa are pink and moist without lesions or exudates. Mouth:  Oral mucosa and oropharynx without lesions or exudates.  Teeth in good repair. Neck:  No deformities, masses, or tenderness noted. Lungs:  scattered rhonchi Heart:  Normal rate and regular rhythm. S1 and S2 normal without gallop, murmur, click, rub or other extra sounds.   Impression & Recommendations:  Problem # 1:  ACUTE BRONCHITIS (ICD-466.0)  Her updated medication list for this problem includes:    Symbicort 160-4.5 Mcg/act Aero (Budesonide-formoterol fumarate) ..... Inhale 2 puffs two times a day    Proair Hfa 108 (90 Base) Mcg/act Aers (Albuterol sulfate) .Marland Kitchen... 2 puffs four times a day as needed    Augmentin 875-125 Mg Tabs (Amoxicillin-pot clavulanate) .Marland Kitchen..Marland Kitchen Two times a day    Hydromet 5-1.5 Mg/35ml Syrp (Hydrocodone-homatropine) .Marland Kitchen... 1 tsp q 4 hours as needed cough  Complete Medication List: 1)  Omeprazole 20 Mg Cpdr (Omeprazole) .... Once daily 2)  Klor-con M20 20 Meq Tbcr (Potassium chloride  crys cr) .Marland Kitchen.. 1 by mouth once daily 3)  Lisinopril-hydrochlorothiazide 20-12.5 Mg Tabs (Lisinopril-hydrochlorothiazide) .Marland Kitchen.. 1 by mouth once daily 4)  Zoloft 50 Mg Tabs (Sertraline hcl) .... Once daily 5)  Bayer Aspirin Ec Low Dose 81 Mg Tbec (Aspirin) .... Once daily 6)  Symbicort 160-4.5 Mcg/act Aero (Budesonide-formoterol fumarate) .... Inhale 2 puffs two times a day 7)  Proair Hfa 108 (90 Base) Mcg/act Aers (Albuterol sulfate) .... 2 puffs four times a day as needed 8)  Allergy Vaccine Gh 1:10  .... Once a week 9)  Macrobid 100 Mg Caps (Nitrofurantoin monohyd macro) .Marland Kitchen.. 1 by mouth two times a day 10)  Augmentin 875-125 Mg Tabs (Amoxicillin-pot clavulanate) .... Two times a day 11)  Hydromet 5-1.5 Mg/38ml Syrp (Hydrocodone-homatropine) .Marland Kitchen.. 1 tsp q 4 hours as needed cough  Patient Instructions: 1)  Out of work today.  2)  Please schedule a follow-up appointment as needed .  Prescriptions: HYDROMET 5-1.5 MG/5ML SYRP (HYDROCODONE-HOMATROPINE) 1 tsp q 4 hours as needed cough  #240 x 0   Entered and Authorized by:   Nelwyn Salisbury MD   Signed by:   Nelwyn Salisbury MD on 12/11/2009   Method used:   Print then Give to Patient   RxID:   [id number] AUGMENTIN 875-125 MG TABS (AMOXICILLIN-POT CLAVULANATE) two times a day  #20 x 0   Entered and Authorized by:   Nelwyn Salisbury MD   Signed by:   Nelwyn Salisbury MD on 12/11/2009   Method used:   Print then Give to Patient   RxID:   [id number]

## 2010-12-11 NOTE — Assessment & Plan Note (Signed)
Summary: 6 months/apc   Primary Provider/Referring Provider:  S. Clent Ridges  CC:  6 month follow up visit-allergies..  History of Present Illness: September 09, 2009- allergic rhintis, athma Breathing well, but still notes dyspnea with brisker walk. We reviewed her PFT from last year and discussed asthma vs deconditioning. On 10/18, felt "sick" and went to urgent care- got septra for sinus infection,  just ending today. She thinks it caused dry mouth. Has minor cough she accepts, rather than changing her lisinopril.  Mar 10, 2010- Allergic rhinitis, Asthma Got Augmentin in February for URI. So far this hasn't been a bad pollen Spring. doing well here with her allergy shots. Had dental extractions- wearing upper plate- eating less. Coughing less since teeth pulled, although she continues Lisinopril- discussed. Uses Symbicort once daily. She just got 3 month refills from [facility], but we speculated about reducing Symbicort ot 80/4.5 in the future.   September 08, 2010 Allergic rhinitis, Asthma Nurse-CC: 6 month follow up visit-allergies. Had flu shot. Had a bronchitis successfully cleared by Dr Clent Ridges in [place]. Had her single pneumovax in 2006. Continues symbicort and allergy vaccine. Uses rescue inhaler every few months.       Asthma History    Initial Asthma Severity Rating:    Age range: 12+ years    Symptoms: 0-2 days/week    Nighttime Awakenings: 0-2/month    Interferes w/ normal activity: no limitations    SABA use (not for EIB): 0-2 days/week    Asthma Severity Assessment: Intermittent   Preventive Screening-Counseling & Management  Alcohol-Tobacco     Smoking Status: never  Current Medications (verified): 1)  Omeprazole 20 Mg Cpdr (Omeprazole) .... Once Daily 2)  Klor-Con M20 20 Meq Tbcr (Potassium Chloride Crys Cr) .Marland Kitchen.. 1 By Mouth Once Daily 3)  Lisinopril-Hydrochlorothiazide 20-12.5 Mg Tabs (Lisinopril-Hydrochlorothiazide) .Marland Kitchen.. 1 By Mouth Once Daily 4)  Zoloft 50 Mg  Tabs  (Sertraline Hcl) .... Once Daily 5)  Bayer Aspirin Ec Low Dose 81 Mg  Tbec (Aspirin) .... Once Daily 6)  Symbicort 160-4.5 Mcg/act  Aero (Budesonide-Formoterol Fumarate) .... Inhale 2 Puffs Two Times A Day 7)  Proair Hfa 108 (90 Base) Mcg/act  Aers (Albuterol Sulfate) .... 2 Puffs Four Times A Day As Needed 8)  Allergy Vaccine Gh 1:10 .... Once A Week 9)  Meclizine Hcl 25 Mg Tabs (Meclizine Hcl) .Marland Kitchen.. 1 Q 4 Hours As Needed Dizziness  Allergies (verified): 1)  Doxycycline  Past History:  Past Medical History: Last updated: 12/03/2009 Allergic rhinitis Asthma, sees Dr. Jetty Duhamel GERD Hypertension Depression sees Dr. Huel Cote for GYN exams  Past Surgical History: Last updated: 03/10/2010 Carpal tunnel release-Right D&C x 2 colonoscopy 06-27-09 per Dr. [name], repeat in 5 yrs Dental extraction  Family History: Last updated: 04/11/2008 Family History of Alcoholism/Addiction Family History Hypertension Family History Other cancer-Colon Fam hx Stroke mother died from cancer---not sure of age father died from cancer---not sure of age 70 siblings 1 brother died of cancer 1 sister alive age 43--hx of diabetes  Social History: Last updated: 09/10/2008 Married- Husband ETOH Never Smoked Alcohol use-no Drug use-no Regular exercise-no  Risk Factors: Exercise: no (05/23/2007)  Risk Factors: Smoking Status: never (09/08/2010)  Review of Systems      See HPI  The patient denies shortness of breath with activity, shortness of breath at rest, productive cough, non-productive cough, coughing up blood, chest pain, irregular heartbeats, acid heartburn, indigestion, loss of appetite, weight change, abdominal pain, difficulty swallowing, sore throat, tooth/dental  problems, headaches, nasal congestion/difficulty breathing through nose, sneezing, itching, ear ache, rash, and change in color of mucus.    Vital Signs:  Patient profile:   60 year old female Height:       60.75 inches Weight:      184.25 pounds BMI:     35.23 O2 Sat:      98 % on Room air Pulse rate:   63 / minute BP sitting:   108 / 80  (left arm) Cuff size:   regular  Vitals Entered By: Reynaldo Minium CMA (September 08, 2010 9:28 AM)  O2 Flow:  Room air CC: 6 month follow up visit-allergies.   Physical Exam  Additional Exam:  General: A/Ox3; pleasant and cooperative, NAD, obese, jovial, laughing SKIN: no rash, lesions NODES: no lymphadenopathy HEENT: [place]/AT, EOM- WNL, Conjuctivae- clear, PERRLA, TM-WNL, Nose- clear, Throat- palate red, Mallampati  II  NECK: Supple w/ fair ROM, JVD- none, normal carotid impulses w/o bruits Thyroid- , CHEST:Clear today, unlabored without cough. HEART: RRR, no m/g/r heard ABDOMEN:overweight WGN:FAOZ, nl pulses, no edema ,  NEURO: Grossly intact to observation      Impression & Recommendations:  Problem # 1:  ASTHMA (ICD-493.90) Mild intermittent asthma with good control. social stress related to husband is a trigger.  She is stable enough that we can reduce Symbicort to 80/4.5 for cost savings.   Problem # 2:  ALLERGIC RHINITIS (ICD-477.9)  Good control now. Allergy vaccine continues- she says it has helped her.  The following medications were removed from the medication list:    Promethazine Hcl 25 Mg Tabs (Promethazine hcl) .Marland Kitchen... 1 q 4 hours as needed nausea  Medications Added to Medication List This Visit: 1)  Symbicort 160-4.5 Mcg/act Aero (Budesonide-formoterol fumarate) .... Inhale 2 puffs and rinse mouth  two times a day 2)  Symbicort 80-4.5 Mcg/act Aero (Budesonide-formoterol fumarate) .... 2 puffs two times a day and rinse mouth well after use  Other Orders: Est. Patient Level IV (30865) Pneumococcal Vaccine (78469) Admin 1st Vaccine (62952)  Patient Instructions: 1)  Please schedule a follow-up appointment in 6 months. 2)  Call if we can help 3)  We are changing Symbicort to the 80/4.5 strength, used the same way  . Prescriptions: SYMBICORT 80-4.5 MCG/ACT AERO (BUDESONIDE-FORMOTEROL FUMARATE) 2 puffs two times a day and RINSE MOUTH WELL AFTER USE  #3 x 3   Entered by:   Reynaldo Minium CMA   Authorized by:   Waymon Budge MD   Signed by:   Reynaldo Minium CMA on 09/08/2010   Method used:   Electronically to        [facility]* (retail)             ,          Ph: [phone]       Fax: [phone]   RxID:   [id number] SYMBICORT 160-4.5 MCG/ACT  AERO (BUDESONIDE-FORMOTEROL FUMARATE) Inhale 2 puffs and rinse mouth  two times a day  #3 x 3   Entered and Authorized by:   Waymon Budge MD   Signed by:   Waymon Budge MD on 09/08/2010   Method used:   Print then Give to Patient   RxID:   [id number]    Immunizations Administered:  Pneumonia Vaccine:    Vaccine Type: Pneumovax    Site: left deltoid    Mfr: Merck    Dose: 0.5 ml    Route: IM    Given by: Lynnae Sandhoff  Ford,CMA (AAMA)    Exp. Date: 01/26/2012    Lot #: [id number]   Immunizations Administered:  Pneumonia Vaccine:    Vaccine Type: Pneumovax    Site: left deltoid    Mfr: Merck    Dose: 0.5 ml    Route: IM    Given by: Lynnae Sandhoff Ford,CMA (AAMA)    Exp. Date: 01/26/2012    Lot #: [id number]

## 2010-12-11 NOTE — Miscellaneous (Signed)
Summary: Injection Record/[facility]  Injection Record/[facility]   Imported By: Sherian Rein 04/01/2010 11:16:33  _____________________________________________________________________  External Attachment:    Type:   Image     Comment:   External Document

## 2010-12-11 NOTE — Assessment & Plan Note (Signed)
Summary: ?bronchitus/cjr   Vital Signs:  Patient profile:   60 year old female Weight:      184.5 pounds O2 Sat:      98 % Temp:     98.4 degrees F Pulse rate:   96 / minute BP sitting:   112 / 78  (left arm) Cuff size:   regular  Vitals Entered By: Pura Spice, RN (August 25, 2010 2:16 PM) CC: cough congestion onset 2 days    History of Present Illness: Here for 2 days of chest congesiton and coughing up green sputum. No fever. On Mucinex.   Allergies: 1)  Doxycycline  Past History:  Past Medical History: Reviewed history from 12/03/2009 and no changes required. Allergic rhinitis Asthma, sees Dr. Jetty Duhamel GERD Hypertension Depression sees Dr. Huel Cote for GYN exams  Review of Systems  The patient denies anorexia, fever, weight loss, weight gain, vision loss, decreased hearing, hoarseness, chest pain, syncope, dyspnea on exertion, peripheral edema, headaches, hemoptysis, abdominal pain, melena, hematochezia, severe indigestion/heartburn, hematuria, incontinence, genital sores, muscle weakness, suspicious skin lesions, transient blindness, difficulty walking, depression, unusual weight change, abnormal bleeding, enlarged lymph nodes, angioedema, breast masses, and testicular masses.    Physical Exam  General:  Well-developed,well-nourished,in no acute distress; alert,appropriate and cooperative throughout examination Head:  Normocephalic and atraumatic without obvious abnormalities. No apparent alopecia or balding. Eyes:  No corneal or conjunctival inflammation noted. EOMI. Perrla. Funduscopic exam benign, without hemorrhages, exudates or papilledema. Vision grossly normal. Ears:  External ear exam shows no significant lesions or deformities.  Otoscopic examination reveals clear canals, tympanic membranes are intact bilaterally without bulging, retraction, inflammation or discharge. Hearing is grossly normal bilaterally. Nose:  External nasal examination  shows no deformity or inflammation. Nasal mucosa are pink and moist without lesions or exudates. Mouth:  Oral mucosa and oropharynx without lesions or exudates.  Teeth in good repair. Neck:  No deformities, masses, or tenderness noted. Lungs:  scattered rhonchi    Impression & Recommendations:  Problem # 1:  ACUTE BRONCHITIS (ICD-466.0)  Her updated medication list for this problem includes:    Symbicort 160-4.5 Mcg/act Aero (Budesonide-formoterol fumarate) ..... Inhale 2 puffs two times a day    Proair Hfa 108 (90 Base) Mcg/act Aers (Albuterol sulfate) .Marland Kitchen... 2 puffs four times a day as needed    Zithromax Z-pak 250 Mg Tabs (Azithromycin) .Marland Kitchen... As directed    Hydromet 5-1.5 Mg/81ml Syrp (Hydrocodone-homatropine) .Marland Kitchen... 1 tsp q 4 hours as needed cough  Complete Medication List: 1)  Omeprazole 20 Mg Cpdr (Omeprazole) .... Once daily 2)  Klor-con M20 20 Meq Tbcr (Potassium chloride crys cr) .Marland Kitchen.. 1 by mouth once daily 3)  Lisinopril-hydrochlorothiazide 20-12.5 Mg Tabs (Lisinopril-hydrochlorothiazide) .Marland Kitchen.. 1 by mouth once daily 4)  Zoloft 50 Mg Tabs (Sertraline hcl) .... Once daily 5)  Bayer Aspirin Ec Low Dose 81 Mg Tbec (Aspirin) .... Once daily 6)  Symbicort 160-4.5 Mcg/act Aero (Budesonide-formoterol fumarate) .... Inhale 2 puffs two times a day 7)  Proair Hfa 108 (90 Base) Mcg/act Aers (Albuterol sulfate) .... 2 puffs four times a day as needed 8)  Allergy Vaccine Gh 1:10  .... Once a week 9)  Promethazine Hcl 25 Mg Tabs (Promethazine hcl) .Marland Kitchen.. 1 q 4 hours as needed nausea 10)  Meclizine Hcl 25 Mg Tabs (Meclizine hcl) .Marland Kitchen.. 1 q 4 hours as needed dizziness 11)  Zithromax Z-pak 250 Mg Tabs (Azithromycin) .... As directed 12)  Hydromet 5-1.5 Mg/52ml Syrp (Hydrocodone-homatropine) .Marland Kitchen.. 1 tsp q 4  hours as needed cough  Patient Instructions: 1)  Please schedule a follow-up appointment as needed .  Prescriptions: HYDROMET 5-1.5 MG/5ML SYRP (HYDROCODONE-HOMATROPINE) 1 tsp q 4 hours as needed  cough  #240 x 0   Entered and Authorized by:   Nelwyn Salisbury MD   Signed by:   Nelwyn Salisbury MD on 08/25/2010   Method used:   Print then Give to Patient   RxID:   [id number] Z-PAK 250 MG TABS (AZITHROMYCIN) as directed  #1 x 0   Entered and Authorized by:   Nelwyn Salisbury MD   Signed by:   Nelwyn Salisbury MD on 08/25/2010   Method used:   Print then Give to Patient   RxID:   [id number]    Orders Added: 1)  Est. Patient Level IV [87564]
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
## 2010-12-11 NOTE — Progress Notes (Signed)
Summary: refill  Phone Note Refill Request Call back at Work Phone [phone] Message from:  Patient---live call  Refills Requested: Medication #1:  ZOLOFT 50 MG  TABS once daily   Brand Name Necessary? No send to [facility]  Initial call taken by: Warnell Forester,  August 07, 2010 8:53 AM    Prescriptions: ZOLOFT 50 MG  TABS (SERTRALINE HCL) once daily  #90 x 0   Entered by:   Josph Macho RMA   Authorized by:   Nelwyn Salisbury MD   Signed by:   Josph Macho RMA on 08/07/2010   Method used:   Electronically to        [facility]* (retail)             ,          Ph: [phone]       Fax: [phone]   RxID:   [id number]

## 2010-12-11 NOTE — Assessment & Plan Note (Signed)
Summary: ?inner ear problem/njr   Vital Signs:  Patient profile:   60 year old female Weight:      189 pounds BMI:     36.14 Temp:     98.7 degrees F oral BP sitting:   102 / 80  (left arm) Cuff size:   regular  Vitals Entered By: Raechel Ache, RN (Apr 03, 2010 9:54 AM) CC: C/o dizziness, room spinning, nausea, diarrhea- started last night.   History of Present Illness: Here for 2 days of vertigo with a sensation of the room spinning when she moves quickly. She gets nauseated with this but does not vomit. No HA or othe neurologic deficits. Vision is normal.  Allergies: 1)  Doxycycline  Past History:  Past Medical History: Reviewed history from 12/03/2009 and no changes required. Allergic rhinitis Asthma, sees Dr. Jetty Duhamel GERD Hypertension Depression sees Dr. Huel Cote for GYN exams  Review of Systems  The patient denies anorexia, fever, weight loss, weight gain, vision loss, decreased hearing, hoarseness, chest pain, syncope, dyspnea on exertion, peripheral edema, prolonged cough, headaches, hemoptysis, abdominal pain, melena, hematochezia, severe indigestion/heartburn, hematuria, incontinence, genital sores, muscle weakness, suspicious skin lesions, transient blindness, difficulty walking, depression, unusual weight change, abnormal bleeding, enlarged lymph nodes, angioedema, breast masses, and testicular masses.    Physical Exam  General:  Well-developed,well-nourished,in no acute distress; alert,appropriate and cooperative throughout examination Head:  Normocephalic and atraumatic without obvious abnormalities. No apparent alopecia or balding. Eyes:  No corneal or conjunctival inflammation noted. EOMI. Perrla. Funduscopic exam benign, without hemorrhages, exudates or papilledema. Vision grossly normal. Ears:  External ear exam shows no significant lesions or deformities.  Otoscopic examination reveals clear canals, tympanic membranes are intact bilaterally  without bulging, retraction, inflammation or discharge. Hearing is grossly normal bilaterally. Nose:  External nasal examination shows no deformity or inflammation. Nasal mucosa are pink and moist without lesions or exudates. Mouth:  Oral mucosa and oropharynx without lesions or exudates.  Teeth in good repair. Neck:  No deformities, masses, or tenderness noted. Lungs:  Normal respiratory effort, chest expands symmetrically. Lungs are clear to auscultation, no crackles or wheezes. Heart:  Normal rate and regular rhythm. S1 and S2 normal without gallop, murmur, click, rub or other extra sounds. Neurologic:  No cranial nerve deficits noted. Station and gait are normal. Plantar reflexes are down-going bilaterally. DTRs are symmetrical throughout. Sensory, motor and coordinative functions appear intact.   Impression & Recommendations:  Problem # 1:  DIZZINESS (ICD-780.4)  Her updated medication list for this problem includes:    Claritin 10 Mg Tabs (Loratadine) ..... Once daily    Promethazine Hcl 25 Mg Tabs (Promethazine hcl) .Marland Kitchen... 1 q 4 hours as needed nausea    Meclizine Hcl 25 Mg Tabs (Meclizine hcl) .Marland Kitchen... 1 q 4 hours as needed dizziness  Complete Medication List: 1)  Omeprazole 20 Mg Cpdr (Omeprazole) .... Once daily 2)  Klor-con M20 20 Meq Tbcr (Potassium chloride crys cr) .Marland Kitchen.. 1 by mouth once daily 3)  Lisinopril-hydrochlorothiazide 20-12.5 Mg Tabs (Lisinopril-hydrochlorothiazide) .Marland Kitchen.. 1 by mouth once daily 4)  Zoloft 50 Mg Tabs (Sertraline hcl) .... Once daily 5)  Bayer Aspirin Ec Low Dose 81 Mg Tbec (Aspirin) .... Once daily 6)  Symbicort 160-4.5 Mcg/act Aero (Budesonide-formoterol fumarate) .... Inhale 2 puffs two times a day 7)  Proair Hfa 108 (90 Base) Mcg/act Aers (Albuterol sulfate) .... 2 puffs four times a day as needed 8)  Allergy Vaccine Gh 1:10  .... Once a week 9)  Claritin 10 Mg Tabs (Loratadine) .... Once daily 10)  Promethazine Hcl 25 Mg Tabs (Promethazine hcl) .Marland Kitchen.. 1 q  4 hours as needed nausea 11)  Meclizine Hcl 25 Mg Tabs (Meclizine hcl) .Marland Kitchen.. 1 q 4 hours as needed dizziness  Patient Instructions: 1)  Rest, drink fluids,  2)  Please schedule a follow-up appointment as needed .  Prescriptions: MECLIZINE HCL 25 MG TABS (MECLIZINE HCL) 1 q 4 hours as needed dizziness  #60 x 5   Entered and Authorized by:   Nelwyn Salisbury MD   Signed by:   Nelwyn Salisbury MD on 04/03/2010   Method used:   Electronically to        [facility] [phone]* (retail)       [address]       [place], [place]  [address]       Ph: [phone]       Fax: [phone]   RxID:   [id number] PROMETHAZINE HCL 25 MG TABS (PROMETHAZINE HCL) 1 q 4 hours as needed nausea  #30 x 5   Entered and Authorized by:   Nelwyn Salisbury MD   Signed by:   Nelwyn Salisbury MD on 04/03/2010   Method used:   Electronically to        [facility] [phone]* (retail)       [address]       [place], [place]  [address]       Ph: [phone]       Fax: [phone]   RxID:   [id number]

## 2010-12-11 NOTE — Progress Notes (Signed)
Summary: prescript  Phone Note Call from Patient Call back at [phone]   Caller: Patient Call For: young Summary of Call: need symbicort prescript sent to [facility] for refill Initial call taken by: Rickard Patience,  January 10, 2010 8:37 AM  Follow-up for Phone Call        rx sent to [facility] Follow-up by: Philipp Deputy CMA,  January 10, 2010 9:53 AM    Prescriptions: SYMBICORT 160-4.5 MCG/ACT  AERO (BUDESONIDE-FORMOTEROL FUMARATE) Inhale 2 puffs two times a day  #3 x 3   Entered by:   Philipp Deputy CMA   Authorized by:   Waymon Budge MD   Signed by:   Philipp Deputy CMA on 01/10/2010   Method used:   Electronically to        [facility]* (mail-order)             ,          Ph: [phone]       Fax: [phone]   RxID:   [id number]

## 2010-12-11 NOTE — Letter (Signed)
Summary: [facility]   Imported By: Maryln Gottron 04/16/2010 12:24:34  _____________________________________________________________________  External Attachment:    Type:   Image     Comment:   External Document

## 2010-12-11 NOTE — Assessment & Plan Note (Signed)
Summary: 6 months/apc   Primary Provider/Referring Provider:  S. Clent Ridges  CC:  6 month follow up visit.  History of Present Illness:  03/11/09- Allergic rhinitis,asthma Ran out of money for Symbicort, has missed having it over past 2 weeks. No acute illness, but seasonal pollens are bad with rinitis.   05/23/09- allergic rhinitis, asthma had done well since last here, until last night when she felt stuffy. about 3AM woke aware of smothery dyspnea. Can't get comfortable deep breath. Aware of some wheeze. Her rescue inhaler did help. Denies chest pain, palpitation, fever, chills, leg tightness or cramping. Busy at work- sitting more. Sick exposure this week to family with bronchitis.  September 09, 2009- allergic rhintis, athma Breathing well, but still notes dyspnea with brisker walk. We reviewed her PFT from last year and discussed asthma vs deconditioning. On 10/18, felt "sick" and went to urgent care- got septra for sinus infection,  just ending today. She thinks it caused dry mouth. Has minor cough she accepts, rather than changing her lisinopril.  Mar 10, 2010- Allergic rhinitis, Asthma Got Augmentin in February for URI. So far this hasn't been a bad pollen Spring. doing well here with her allergy shots. Had dental extractions- wearing upper plate- eating less. Coughing less since teeth pulled, although she continues Lisinopril- discussed. Uses Symbicort once daily. She just got 3 month refills from [facility], but we speculated about reducing Symbicort ot 80/4.5 in the future.      Current Medications (verified): 1)  Omeprazole 20 Mg Cpdr (Omeprazole) .... Once Daily 2)  Klor-Con M20 20 Meq Tbcr (Potassium Chloride Crys Cr) .Marland Kitchen.. 1 By Mouth Once Daily 3)  Lisinopril-Hydrochlorothiazide 20-12.5 Mg Tabs (Lisinopril-Hydrochlorothiazide) .Marland Kitchen.. 1 By Mouth Once Daily 4)  Zoloft 50 Mg  Tabs (Sertraline Hcl) .... Once Daily 5)  Bayer Aspirin Ec Low Dose 81 Mg  Tbec (Aspirin) .... Once Daily 6)   Symbicort 160-4.5 Mcg/act  Aero (Budesonide-Formoterol Fumarate) .... Inhale 2 Puffs Two Times A Day 7)  Proair Hfa 108 (90 Base) Mcg/act  Aers (Albuterol Sulfate) .... 2 Puffs Four Times A Day As Needed 8)  Allergy Vaccine Gh 1:10 .... Once A Week  Allergies (verified): 1)  Doxycycline  Past History:  Past Medical History: Last updated: 12/03/2009 Allergic rhinitis Asthma, sees Dr. Jetty Duhamel GERD Hypertension Depression sees Dr. Huel Cote for GYN exams  Family History: Last updated: 04/11/2008 Family History of Alcoholism/Addiction Family History Hypertension Family History Other cancer-Colon Fam hx Stroke mother died from cancer---not sure of age father died from cancer---not sure of age 32 siblings 1 brother died of cancer 1 sister alive age 51--hx of diabetes  Social History: Last updated: 09/10/2008 Married- Husband ETOH Never Smoked Alcohol use-no Drug use-no Regular exercise-no  Risk Factors: Exercise: no (05/23/2007)  Risk Factors: Smoking Status: never (05/23/2007)  Past Surgical History: Carpal tunnel release-Right D&C x 2 colonoscopy 06-27-09 per Dr. [name], repeat in 5 yrs Dental extraction  Review of Systems      See HPI  The patient denies anorexia, fever, weight loss, weight gain, vision loss, decreased hearing, hoarseness, chest pain, syncope, dyspnea on exertion, peripheral edema, prolonged cough, headaches, hemoptysis, and severe indigestion/heartburn.    Vital Signs:  Patient profile:   60 year old female Height:      60.75 inches Weight:      194 pounds BMI:     37.09 O2 Sat:      97 % on Room air Pulse rate:   83 /  minute BP sitting:   110 / 60  (left arm) Cuff size:   regular  Vitals Entered By: Reynaldo Minium CMA (Mar 10, 2010 9:28 AM)  O2 Flow:  Room air  Physical Exam  Additional Exam:  General: A/Ox3; pleasant and cooperative, NAD, obese, jovial, laughing SKIN: no rash, lesions NODES: no  lymphadenopathy HEENT: [place]/AT, EOM- WNL, Conjuctivae- clear, PERRLA, TM-WNL, Nose- clear, Throat- palate red, Mallampati  II  NECK: Supple w/ fair ROM, JVD- none, normal carotid impulses w/o bruits Thyroid- , CHEST:Clear today, unlabored without cough. HEART: RRR, no m/g/r heard ABDOMEN: ZHY:QMVH, nl pulses, no edema ,  NEURO: Grossly intact to observation      Impression & Recommendations:  Problem # 1:  ALLERGIC RHINITIS (ICD-477.9)  Good control. She is satisfied with her treatment.  Problem # 2:  ASTHMA (ICD-493.90) Good control. She is using Symbicort 160/4.5 once daily. If she hadn't just gotten her new shipment in, we would change her to the 80/4.5 ans discussed this as a future option.  Other Orders: Est. Patient Level III (84696)  Patient Instructions: 1)  Please schedule a follow-up appointment in 6 months. 2)  Continue present treatment. Call as needed.  3)  We might want to consider reducing the strength/ cost of your Symbicort next time you need a prescription for it.

## 2010-12-11 NOTE — Miscellaneous (Signed)
Summary: Injection Record / [facility]    Injection Record / [facility]    Imported By: Lennie Odor 07/30/2010 11:42:49  _____________________________________________________________________  External Attachment:    Type:   Image     Comment:   External Document

## 2010-12-12 DIAGNOSIS — J301 Allergic rhinitis due to pollen: Secondary | ICD-10-CM

## 2010-12-15 ENCOUNTER — Other Ambulatory Visit: Payer: Self-pay | Admitting: Family Medicine

## 2010-12-15 DIAGNOSIS — K219 Gastro-esophageal reflux disease without esophagitis: Secondary | ICD-10-CM

## 2010-12-17 ENCOUNTER — Encounter: Payer: Self-pay | Admitting: Family Medicine

## 2010-12-25 ENCOUNTER — Encounter: Payer: Self-pay | Admitting: Internal Medicine

## 2010-12-25 ENCOUNTER — Ambulatory Visit (INDEPENDENT_AMBULATORY_CARE_PROVIDER_SITE_OTHER): Payer: 59

## 2010-12-25 DIAGNOSIS — J301 Allergic rhinitis due to pollen: Secondary | ICD-10-CM

## 2011-01-02 ENCOUNTER — Other Ambulatory Visit (INDEPENDENT_AMBULATORY_CARE_PROVIDER_SITE_OTHER): Payer: 59 | Admitting: Family Medicine

## 2011-01-02 DIAGNOSIS — E785 Hyperlipidemia, unspecified: Secondary | ICD-10-CM

## 2011-01-02 DIAGNOSIS — Z0000 Encounter for general adult medical examination without abnormal findings: Secondary | ICD-10-CM

## 2011-01-02 LAB — POCT URINALYSIS DIPSTICK
Bilirubin, UA: NEGATIVE
Glucose, UA: NEGATIVE
Nitrite, UA: NEGATIVE
Spec Grav, UA: 1.025

## 2011-01-02 LAB — BASIC METABOLIC PANEL
CO2: 26 mEq/L (ref 19–32)
Calcium: 9.3 mg/dL (ref 8.4–10.5)
Chloride: 108 mEq/L (ref 96–112)
Sodium: 143 mEq/L (ref 135–145)

## 2011-01-02 LAB — CBC WITH DIFFERENTIAL/PLATELET
Basophils Relative: 0.8 % (ref 0.0–3.0)
Eosinophils Absolute: 0.1 10*3/uL (ref 0.0–0.7)
Eosinophils Relative: 1.7 % (ref 0.0–5.0)
Hemoglobin: 13.3 g/dL (ref 12.0–15.0)
Lymphocytes Relative: 29.5 % (ref 12.0–46.0)
MCHC: 34.6 g/dL (ref 30.0–36.0)
MCV: 89.7 fl (ref 78.0–100.0)
Neutro Abs: 3.6 10*3/uL (ref 1.4–7.7)
RBC: 4.28 Mil/uL (ref 3.87–5.11)
WBC: 5.8 10*3/uL (ref 4.5–10.5)

## 2011-01-02 LAB — HEPATIC FUNCTION PANEL
ALT: 15 U/L (ref 0–35)
AST: 15 U/L (ref 0–37)
Albumin: 4.1 g/dL (ref 3.5–5.2)
Alkaline Phosphatase: 59 U/L (ref 39–117)
Bilirubin, Direct: 0.1 mg/dL (ref 0.0–0.3)
Total Protein: 6.6 g/dL (ref 6.0–8.3)

## 2011-01-02 LAB — TSH: TSH: 1.36 u[IU]/mL (ref 0.35–5.50)

## 2011-01-02 LAB — LDL CHOLESTEROL, DIRECT: Direct LDL: 146.7 mg/dL

## 2011-01-02 LAB — LIPID PANEL: HDL: 51.1 mg/dL (ref >39.00)

## 2011-01-08 ENCOUNTER — Telehealth: Payer: Self-pay | Admitting: *Deleted

## 2011-01-08 NOTE — Telephone Encounter (Signed)
Message copied by Tor Netters on Thu Jan 08, 2011  3:50 PM ------      Message from: Dwaine Deter      Created: Mon Jan 05, 2011  8:48 AM       Normal except high chol. Watch the diet

## 2011-01-08 NOTE — Telephone Encounter (Signed)
Message copied by Tor Netters on Thu Jan 08, 2011  3:51 PM ------      Message from: Dwaine Deter      Created: Mon Jan 05, 2011  8:48 AM       Normal except high chol. Watch the diet

## 2011-01-08 NOTE — Telephone Encounter (Signed)
Left message to call back  

## 2011-01-09 ENCOUNTER — Encounter: Payer: Self-pay | Admitting: Family Medicine

## 2011-01-09 ENCOUNTER — Ambulatory Visit (INDEPENDENT_AMBULATORY_CARE_PROVIDER_SITE_OTHER): Payer: 59 | Admitting: Family Medicine

## 2011-01-09 ENCOUNTER — Ambulatory Visit (INDEPENDENT_AMBULATORY_CARE_PROVIDER_SITE_OTHER): Payer: 59

## 2011-01-09 VITALS — BP 146/66 | HR 97 | Temp 98.0°F | Resp 12 | Wt 184.0 lb

## 2011-01-09 DIAGNOSIS — Z0000 Encounter for general adult medical examination without abnormal findings: Secondary | ICD-10-CM

## 2011-01-09 DIAGNOSIS — K219 Gastro-esophageal reflux disease without esophagitis: Secondary | ICD-10-CM

## 2011-01-09 DIAGNOSIS — I10 Essential (primary) hypertension: Secondary | ICD-10-CM

## 2011-01-09 MED ORDER — SERTRALINE HCL 100 MG PO TABS: 100.0000 mg | ORAL_TABLET | Freq: Every day | ORAL | Status: DC

## 2011-01-09 MED ORDER — LISINOPRIL-HYDROCHLOROTHIAZIDE 20-12.5 MG PO TABS: 1.0000 | ORAL_TABLET | Freq: Every day | ORAL | Status: DC

## 2011-01-09 MED ORDER — POTASSIUM CHLORIDE CRYS ER 20 MEQ PO TBCR: 20.0000 meq | EXTENDED_RELEASE_TABLET | Freq: Every day | ORAL | Status: DC

## 2011-01-09 MED ORDER — OMEPRAZOLE 20 MG PO CPDR: 20.0000 mg | DELAYED_RELEASE_CAPSULE | Freq: Every day | ORAL | Status: DC
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
## 2011-01-12 ENCOUNTER — Encounter: Payer: Self-pay | Admitting: Internal Medicine

## 2011-01-12 DIAGNOSIS — J301 Allergic rhinitis due to pollen: Secondary | ICD-10-CM | POA: Insufficient documentation

## 2011-01-12 NOTE — Telephone Encounter (Signed)
patient  Is aware 

## 2011-01-16 ENCOUNTER — Telehealth (INDEPENDENT_AMBULATORY_CARE_PROVIDER_SITE_OTHER): Payer: Self-pay | Admitting: *Deleted

## 2011-01-20 NOTE — Assessment & Plan Note (Signed)
Summary: ALLERGY/CB  Nurse Visit   Allergies: 1)  ! Doxycycline  Orders Added: 1)  Allergy Injection (1) [95115] 

## 2011-01-23 ENCOUNTER — Encounter: Payer: Self-pay | Admitting: Internal Medicine

## 2011-01-23 ENCOUNTER — Ambulatory Visit (INDEPENDENT_AMBULATORY_CARE_PROVIDER_SITE_OTHER): Payer: 59

## 2011-01-23 DIAGNOSIS — J301 Allergic rhinitis due to pollen: Secondary | ICD-10-CM

## 2011-01-27 NOTE — Assessment & Plan Note (Signed)
Summary: allergy/cb  Nurse Visit   Allergies: 1)  ! Doxycycline  Orders Added: 1)  Allergy Injection (1) [95115] 

## 2011-01-27 NOTE — Progress Notes (Signed)
Summary: needs new prescription for symbicort sent to [facility]-  Phone Note Call from Patient   Caller: Patient Call For: YOUNG Summary of Call: patient phoned stated that when she was seen last Dr Maple Hudson sent a prescription to [facility] for a new inhailer lower dose, but [facility] wouldnt fill it stated that she wasnt due for a new inhailer yet and now that prescription has expired. She needs a new prescription for her Symbicort to [facility]. She can be reached at [phone] Initial call taken by: Vedia Coffer,  January 16, 2011 11:24 AM  Follow-up for Phone Call        [facility] Vernie Murders  January 16, 2011 4:22 PM  Spoke with pt. She is requesting rx for symbcort 80 be sent to [facility] for 90 day supply.  This was done and pt aware. Follow-up by: Vernie Murders,  January 19, 2011 11:43 AM    Prescriptions: SYMBICORT 80-4.5 MCG/ACT AERO (BUDESONIDE-FORMOTEROL FUMARATE) 2 puffs two times a day and RINSE MOUTH WELL AFTER USE  #3 x 3   Entered by:   Vernie Murders   Authorized by:   Waymon Budge MD   Signed by:   Vernie Murders on 01/19/2011   Method used:   Electronically to        [facility]* (retail)             ,          Ph: [phone]       Fax: [phone]   RxID:   [id number]

## 2011-01-27 NOTE — Miscellaneous (Signed)
Summary: Injection Financial risk analyst   Imported By: Sherian Rein 01/19/2011 14:32:24  _____________________________________________________________________  External Attachment:    Type:   Image     Comment:   External Document

## 2011-01-30 ENCOUNTER — Ambulatory Visit (INDEPENDENT_AMBULATORY_CARE_PROVIDER_SITE_OTHER): Payer: 59

## 2011-01-30 DIAGNOSIS — J301 Allergic rhinitis due to pollen: Secondary | ICD-10-CM

## 2011-02-04 ENCOUNTER — Ambulatory Visit (INDEPENDENT_AMBULATORY_CARE_PROVIDER_SITE_OTHER): Payer: 59

## 2011-02-04 DIAGNOSIS — J301 Allergic rhinitis due to pollen: Secondary | ICD-10-CM

## 2011-02-20 ENCOUNTER — Ambulatory Visit (INDEPENDENT_AMBULATORY_CARE_PROVIDER_SITE_OTHER): Payer: 59

## 2011-02-20 DIAGNOSIS — J309 Allergic rhinitis, unspecified: Secondary | ICD-10-CM

## 2011-02-27 ENCOUNTER — Ambulatory Visit (INDEPENDENT_AMBULATORY_CARE_PROVIDER_SITE_OTHER): Payer: 59

## 2011-02-27 DIAGNOSIS — J309 Allergic rhinitis, unspecified: Secondary | ICD-10-CM

## 2011-03-03 ENCOUNTER — Encounter: Payer: Self-pay | Admitting: Internal Medicine

## 2011-03-05 ENCOUNTER — Encounter: Payer: Self-pay | Admitting: Internal Medicine

## 2011-03-05 ENCOUNTER — Ambulatory Visit (INDEPENDENT_AMBULATORY_CARE_PROVIDER_SITE_OTHER): Payer: 59 | Admitting: Internal Medicine

## 2011-03-05 ENCOUNTER — Ambulatory Visit (INDEPENDENT_AMBULATORY_CARE_PROVIDER_SITE_OTHER): Payer: 59

## 2011-03-05 VITALS — BP 108/78 | HR 83 | Ht 60.0 in | Wt 187.0 lb

## 2011-03-05 DIAGNOSIS — J301 Allergic rhinitis due to pollen: Secondary | ICD-10-CM

## 2011-03-05 DIAGNOSIS — J309 Allergic rhinitis, unspecified: Secondary | ICD-10-CM

## 2011-03-05 DIAGNOSIS — J45909 Unspecified asthma, uncomplicated: Secondary | ICD-10-CM

## 2011-03-05 NOTE — Assessment & Plan Note (Signed)
She feels definitely better off with allergy vaccine and has no problems with it so we will continue. She feels her saline spray and claritin will be enough to get her through the rest of the Spring pollen season and she doesn't want to try a nasal steroid or other med again.

## 2011-03-05 NOTE — Patient Instructions (Addendum)
Consider trying your claritin at bedtime to see if that helps your morning stuffiness.   Please call as needed

## 2011-03-05 NOTE — Progress Notes (Signed)
  Subjective:    Patient ID: [name], female    DOB: May 22, 1951, 60 y.o.   MRN: [id number]  HPI 55 yoF never smoker, followed for allergy and asthma. Last here September 08, 2010 having had recent bronchitis. Mild winter with no major problems. In the last 3 weeks has blamed pollen for waking with head congestion. Blowing nose, ears stopped up.Chest ok and denies infection. Once upright in the morning and after claritin she is ok the rest of the day. Saline nasal spray helps. Decongestant sprays cause nose bleed.  Chestr has done very well with Symbicort. At least 3 weeks since last used Proair rescue inhaler. Stress at work sometimes makes her tight.   Review of Systems Constitutional:   No weight loss, night sweats,  Fevers, chills, fatigue, lassitude. HEENT:   No headaches,  Difficulty swallowing,  Tooth/dental problems,  Sore throat,                CV:  No chest pain,  Orthopnea, PND, swelling in lower extremities, anasarca, dizziness, palpitations  GI  No heartburn, indigestion, abdominal pain, nausea, vomiting, diarrhea, change in bowel habits, loss of appetite  Resp: No shortness of breath with exertion or at rest.  No excess mucus, no productive cough,  No non-productive cough,  No coughing up of blood.  No change in color of mucus.  No wheezing.  No chest wall deformity  Skin: no rash or lesions.  GU: no dysuria, change in color of urine, no urgency or frequency.  No flank pain.  MS:  No joint pain or swelling.  No decreased range of motion.  No back pain.  Psych:  No change in mood or affect. No depression or anxiety.  No memory loss.     Objective:   Physical Exam General- Alert, Oriented, Affect-appropriate, Distress- none acute  Overweight, cheerful  Skin- rash-none, lesions- none, excoriation- none  Lymphadenopathy- none  Head- atraumatic  Eyes- Gross vision intact, PERRLA, conjunctivae clear,secretions  Ears- Normal-  Hearing, canals, Tm L ,   R ,  Nose-  Clear,  No-Septal dev, mucus, polyps, erosion, perforation   Throat- Mallampati II , mucosa clear , drainage- none, tonsils- atrophic.   Dentures.  Neck- flexible , trachea midline, no stridor , thyroid nl, carotid no bruit  Chest - symmetrical excursion , unlabored     Heart/CV- RRR , no murmur , no gallop  , no rub, nl s1 s2                     - JVD- none , edema- none, stasis changes- none, varices- none     Lung- clear to P&A, wheeze- none, cough- none , dullness-none, rub- none     Chest wall-   Abd- tender-no, distended-no, bowel sounds-present, HSM- no  Br/ Gen/ Rectal- Not done, not indicated  Extrem- cyanosis- none, clubbing, none, atrophy- none, strength- nl  Neuro- grossly intact to observation         Assessment & Plan:

## 2011-03-05 NOTE — Assessment & Plan Note (Signed)
Good control with Symbicort even during pollen season. Meds discussed.

## 2011-03-09 ENCOUNTER — Telehealth: Payer: Self-pay | Admitting: Family Medicine

## 2011-03-09 NOTE — Telephone Encounter (Signed)
Pt is going thru a divorce she would like to increase sertraline 100mg  to higher dose. [facility] [phone]

## 2011-03-10 NOTE — Telephone Encounter (Signed)
Make an OV to discuss  

## 2011-03-11 NOTE — Telephone Encounter (Signed)
LMOM for Pt to make OV appt to discuss matters that she called our office concerning.

## 2011-03-13 ENCOUNTER — Ambulatory Visit (INDEPENDENT_AMBULATORY_CARE_PROVIDER_SITE_OTHER): Payer: 59

## 2011-03-13 DIAGNOSIS — J309 Allergic rhinitis, unspecified: Secondary | ICD-10-CM

## 2011-03-17 ENCOUNTER — Ambulatory Visit (INDEPENDENT_AMBULATORY_CARE_PROVIDER_SITE_OTHER): Payer: 59

## 2011-03-17 DIAGNOSIS — J309 Allergic rhinitis, unspecified: Secondary | ICD-10-CM

## 2011-03-20 ENCOUNTER — Ambulatory Visit (INDEPENDENT_AMBULATORY_CARE_PROVIDER_SITE_OTHER): Payer: 59

## 2011-03-20 DIAGNOSIS — J309 Allergic rhinitis, unspecified: Secondary | ICD-10-CM

## 2011-03-24 NOTE — Assessment & Plan Note (Signed)
[facility]                             PULMONARY OFFICE NOTE   NAVIAH, BELFIELD                      MRN:          [id number]  DATE:03/30/2007                            DOB:          Mar 28, 1951    PROBLEMS:  1. Allergic rhinitis.  2. Asthma.  3. Esophageal reflux.   HISTORY:  She feels some pressure in her right ear, like water.  Has  been taking Sudafed-PE.  She continues allergy vaccine at 1:10 without  problems.  She likes Symbicort but has run out.   MEDICATIONS:  1. Allergy vaccine.  2. Diovan/HCT 80/12.5.  3. Nexium 40 mg.  4. Potassium.  5. Symbicort 160/4.5.  6. Albuterol p.r.n.  7. Claritin.   Drug intolerant to DOXYCYCLINE.   OBJECTIVE:  VITAL SIGNS:  Weight 211 pounds.  BP 130/78, pulse 74, room  air saturation 97%.  HEENT:  External canals and tympanic membranes looked good.  Nasopharynx  and chest are clear.  HEART:  Heart sounds regular.   IMPRESSION:  Eustachian dysfunction with background of seasonal allergic  rhinitis.  Asthma has been well controlled with Symbicort.   PLAN:  1. A-B Otic drops 1 or 2 in affected ear with cotton b.i.d. p.r.n.  2. Refill Symbicort 160/4.5 two puffs b.i.d.  3. Continue Sudafed-PE as needed.  4. Schedule return in six months, earlier p.r.n.    Clinton D. Maple Hudson, MD, Tonny Bollman, FACP  Electronically Signed   CDY/MedQ  DD: 03/30/2007  DT: 03/31/2007  Job #: [id number]   cc:   Tera Mater. Clent Ridges, MD

## 2011-03-27 ENCOUNTER — Ambulatory Visit (INDEPENDENT_AMBULATORY_CARE_PROVIDER_SITE_OTHER): Payer: 59

## 2011-03-27 DIAGNOSIS — J309 Allergic rhinitis, unspecified: Secondary | ICD-10-CM

## 2011-03-27 NOTE — Consult Note (Signed)
[name], [name]               ACCOUNT NO.:  [id number]   MEDICAL RECORD NO.:  [id number]          PATIENT TYPE:  EMS   LOCATION:  ED                           FACILITY:  [facility]   PHYSICIAN:  Lorre Munroe., M.D.DATE OF BIRTH:  November 01, 1951   DATE OF CONSULTATION:  06/02/2005  DATE OF DISCHARGE:                                   CONSULTATION   CHIEF COMPLAINT:  Abdominal pain.   HISTORY OF PRESENT ILLNESS:  This is a 60 year old white female generally in  good health with no chronic GI problems, who had about a 12 hour history of  rather severe, crampy upper abdominal pain, and it moved a little bit  towards the right side.  Dr. Julian Reil saw her in the office and found a  normal white count but left-shifted and some right lower quadrant  tenderness, and he sent her to [facility] for a CT scan and consultation.  The patient has not had fever or chills.  She has vomited once, and she has  had a little bit of diarrhea.  White count was 99,600.  The urinalysis was  unremarkable.   CT scan has been performed already and shows no acute abnormalities with  nothing to suggest diverticulitis or Crohn's disease or appendicitis.  The  patient is feeling somewhat better after being here for a while and  receiving some IV fluids.   PAST MEDICAL HISTORY:  She has high blood pressure.  She has some asthmatic  bronchitis.  She is on lisinopril 20 mg daily, Advair and albuterol MDI as  needed.   No known allergies.   No operations.   Denies heart disease.   FAMILY HISTORY:  Generally unremarkable.   CHILDHOOD ILLNESSES:  Generally unremarkable.   REVIEW OF SYSTEMS:  Unremarkable.   PHYSICAL EXAMINATION:  VITAL SIGNS:  Temperature and vital signs are normal.  GENERAL:  Patient is in no acute distress.  MENTAL STATUS:  Normal.  HEENT/NECK:  Unremarkable with no thyromegaly.  No thyroid mass.  No neck  mass.  No supraclavicular adenopathy.  CHEST:  Clear to auscultation.  HEART:   Rate and rhythm normal.  No murmur or gallop.  ABDOMEN:  Diffuse, slight tenderness without rebound tenderness.  Bowel  sounds are normal.  No hernia or masses detected.  EXTREMITIES:  Normal.   IMPRESSION:  Self-limited gastrointestinal illness, probable viral  gastroenteritis.   PLAN:  Patient is advised to take it very easy tonight.  Limit oral intake  to clear liquids and to call me tomorrow if she is not feeling significantly  better.  I will be happy to re-evaluate her as needed.       WB/MEDQ  D:  06/02/2005  T:  06/02/2005  Job:  [id number]   cc:   Schuyler Amor, M.D.  [address]  [address]  [place], [place] [address]  Fax: [phone]

## 2011-03-27 NOTE — Assessment & Plan Note (Signed)
[facility]                             PULMONARY OFFICE NOTE   DOSIA, YODICE                      MRN:          [id number]  DATE:10/07/2006                            DOB:          [birth year]    PROBLEM:  1. Allergic rhinitis.  2. Asthma.  3. Esophageal reflux.   HISTORY:  She continues allergy vaccine here at 1:10 with no problems  and did get her flu shot.  She has noticed increased tickling cough when  she talks on the telephone all day at work, but no wheeze, no heartburn  on Nexium.  She tried switching from Advair to QVAR, then switched back  and has finally settled on QVAR 40, 2 puffs b.i.d. with only occasional  use of her albuterol inhaler.  With this, she has stopped Advair.   MEDICATIONS:  1. Allergy vaccine.  2. Diovan/HCT 80/12.5.  3. Nexium 40 mg.  4. QVAR 40, 2 puffs b.i.d.  5. Potassium.  6. Rescue albuterol inhaler.  7. Occasional Claritin.   DRUG INTOLERANCE:  DOXYCYCLINE she thought caused palpitations and  diarrhea last winter.   OBJECTIVE:  Weight 211 pounds, blood pressure 144/80, pulse regular 80,  room air saturation 97%.  Quiet, clear chest.  Her throat is somewhat  reddened, suspicious for reflux-type irritation but with no thrush.  Voice quality normal, no stridor, no visible post-nasal drainage.  Heart  sounds regular without murmur.   Pulmonary function testing on October 15 was normal, with minimal  response to bronchodilator.  Diffusion was mildly reduced, probably  reflecting her abdominal obesity (78%).  Her FEV1 was 2.46 (113%).  FVC  was 2.97 (103%).  Ratio 0.3.  Borderline response to bronchodilator only  in the small airway flows.   Her chest x-ray September 24 showed minor scarring in the left base,  otherwise normal, with no active disease.   IMPRESSION:  1. Asthma, adequately controlled.  2. Allergic rhinitis.  3. Esophageal reflux, probably with some room for better control.  We  discussed reflux precautions and management options and pointed out      her obesity and deconditioning, with encouragement to walk.   PLAN:  1. Add p.r.n. TUMS.  2. Otherwise continue present treatments.  Walk for weight loss and      endurance.  3. Schedule return 6 months, earlier p.r.n.     Clinton D. Maple Hudson, MD, Tonny Bollman, FACP  Electronically Signed    CDY/MedQ  DD: 10/07/2006  DT: 10/08/2006  Job #: [id number]   cc:   Tera Mater. Clent Ridges, MD

## 2011-03-27 NOTE — Assessment & Plan Note (Signed)
[facility]                               PULMONARY OFFICE NOTE   [name], [name]                      MRN:          [id number]  DATE:08/02/2006                            DOB:          Jan 12, 1951    PROBLEMS:  1. Asthma.  2. Allergic rhinitis.   HISTORY:  She was last here in May when she saw the nurse practitioner for  acute rhinitis and asthma and was treated with a nebulizer treatment and  Nasacort. She had a question about the safety of Advair, based on television  commercials and we had a long talk about this. She notices some increased  difficulty blowing out birthday candles but with no dramatic change or  sudden event. She still coughs, continuing Nexium. She has not been walking,  because she gets a burning in the pre-tibial areas when she tries to walk  much. I asked that she discuss with her primary physician.   MEDICATIONS:  1. Advair 250/50.  2. Allergy vaccine, continues at 1:10 which she gets here with no      problems.  3. Diovan/HCT 80/12.5.  4. Nexium.  5. Rescue albuterol is used rarely.  6. Zantac.   ALLERGIES:  DOXYCYCLINE which she thought caused palpitations and diarrhea.   OBJECTIVE:  VITAL SIGNS:  Weight 210 pounds, blood pressure 122/94, pulse  regular 68, room air saturation 98%.  GENERAL:  Obese, cheerful.  HEENT:  She does have a dry cough with no wheeze. Her pharynx is somewhat  reddened with no strider.  NECK:  There is no neck vein distension.  HEART: Sounds are regular without murmur or gallop. I find no adenopathy.   IMPRESSION:  1. Red throat and cough favor a reflux pattern, despite her Nexium.  2. Asthma.  3. Allergic rhinitis.   PLAN:  1. To test alternatives we are switching from Advair to use her rescue      albuterol as instructed and to use QVAR 40/2 puffs b.i.d., as her      maintenance steroid.  2. Reflux precautions were emphasized.  3. Chest x-ray.  4. PFT  5. Schedule return 3  months, earlier p.r.n.                                   Clinton D. Maple Hudson, MD, FCCP, FACP   CDY/MedQ  DD:  08/03/2006  DT:  08/04/2006  Job #:  [id number]   cc:   Jeannett Senior A. Clent Ridges, MD

## 2011-03-27 NOTE — Assessment & Plan Note (Signed)
[facility]                             PULMONARY OFFICE NOTE   ROREY, HODGES                      MRN:          [id number]  DATE:02/14/2007                            DOB:          [birth year]    PROBLEM LIST:  1. Allergic rhinitis.  2. Asthma.  3. Esophageal reflux.   HISTORY:  She was worked in today when she came for her allergy shot and  complained of shallow breathing, increasing head and chest congestion  and asked to be seen. She had come off of Advair and was trying Qvar.  She had tried Zyrtec D which she said made her hands numb. The vaccine  continues at 1:10 with no problems. She had not done well with Foradil  or Advair.   MEDICATIONS:  1. Allergy vaccine.  2. Diovan/HCTZ 80/12.5.  3. Nexium 40 mg.  4. Qvar 40.  5. Potassium.  6. Albuterol inhaler p.r.n.  7. Occasional Claritin.   PFT last October had been normal except for mildly reduced diffusion  which may reflect her obesity. Chest x-ray last September showed no  evidence for active chest disease.   OBJECTIVE:  VITAL SIGNS:  Weight 209 pounds, blood pressure 98/60, pulse  regular 76, room air saturation 97%.  LUNGS:  Breath sounds somewhat diminished with dry cough, nasal  congestion.  HEART:  Regular pulse, no murmur.  EXTREMITIES:  No edema.   IMPRESSION:  Mild exacerbation of rhinitis and asthma.   PLAN:  I suggested that she could use Claritin b.i.d. for short periods  if still bothered by rhinorrhea and sneezing. She is going to try  Symbicort 160/4.5, two puffs b.i.d. Keep scheduled appointment, earlier  p.r.n.     Clinton D. Maple Hudson, MD, Tonny Bollman, FACP  Electronically Signed   CDY/MedQ  DD: 02/14/2007  DT: 02/15/2007  Job #: [id number]   cc:   Jeannett Senior A. Clent Ridges, MD

## 2011-03-27 NOTE — Assessment & Plan Note (Signed)
[facility] OFFICE NOTE   [name], [name]                      MRN:          [id number]  DATE:08/26/2006                            DOB:          1951-09-11    This is a 60 year old woman here for a nongynecological physical  examination.  In general, she is doing well and has no complaints.  She  continues to see Dr. Maple Hudson for treatment of asthma and allergies.  She saw  him about 3 weeks ago.  She had a set of pulmonary function tests drawn but  has not heard results from them yet.  He recently switched her from Advair  to Qvar inhalers and she feels like this has been helping her.  She  continues to have regular GYN examinations with Dr. Senaida Ores.  Otherwise,  for details of her past medical history, family history, social history,  habits, etc., I refer you to our introductory note with her dated February 24, 2006.   ALLERGIES:  None.   CURRENT MEDICATIONS:  1. Diovan HCT 80/12.5 once a day.  2. Nexium 40 mg once a day.  3. Qvar once a day.   OBJECTIVE:  VITAL SIGNS:  Height 5 feet 2 inches, weight 211, BP 132/88,  pulse 72 and regular.  GENERAL:  She remains obese.  SKIN:  Clear.  HEENT:  Eyes are clear, ears are clear, pharynx clear.  NECK:  Supple without lymphadenopathy or masses.  LUNGS:  Clear with good air flow.  CARDIAC:  Rate and rhythm regular without gallops, murmurs, rubs.  Distal  pulses full.  ABDOMEN:  Soft, normal bowel sounds, nontender, no masses.  EXTREMITIES:  No clubbing, cyanosis, or edema.  NEUROLOGIC:  Grossly intact.   EKG today is within normal limits.  She had fasting laboratories obtained on  October 11.  This was remarkable for a mildly abnormal lipid panel including  triglycerides high at 173, HDL low at 38, and LDL high at 120.  Her  potassium was also slightly low at 3.4, no doubt due to her diuretic  medication.   ASSESSMENT AND PLAN:  1. Complete  physical.  We discussed increasing exercise and losing weight.  2. Hypertension, stable.  3. Hypokalemia.  Will add K-Dur 10 mEq once a day to her current regimen.  4. Gastroesophageal reflux disease, stable.  5. Asthma, per Dr. Maple Hudson.            ______________________________  Tera Mater. Clent Ridges, MD     SAF/MedQ  DD:  08/27/2006  DT:  08/29/2006  Job #:  [id number]

## 2011-03-31 ENCOUNTER — Encounter: Payer: Self-pay | Admitting: Family Medicine

## 2011-04-03 ENCOUNTER — Ambulatory Visit (INDEPENDENT_AMBULATORY_CARE_PROVIDER_SITE_OTHER): Payer: 59

## 2011-04-03 DIAGNOSIS — J309 Allergic rhinitis, unspecified: Secondary | ICD-10-CM

## 2011-04-10 ENCOUNTER — Ambulatory Visit (INDEPENDENT_AMBULATORY_CARE_PROVIDER_SITE_OTHER): Payer: 59

## 2011-04-10 DIAGNOSIS — J309 Allergic rhinitis, unspecified: Secondary | ICD-10-CM

## 2011-04-17 ENCOUNTER — Ambulatory Visit (INDEPENDENT_AMBULATORY_CARE_PROVIDER_SITE_OTHER): Payer: 59

## 2011-04-17 DIAGNOSIS — J309 Allergic rhinitis, unspecified: Secondary | ICD-10-CM

## 2011-04-28 ENCOUNTER — Ambulatory Visit (INDEPENDENT_AMBULATORY_CARE_PROVIDER_SITE_OTHER): Payer: 59

## 2011-04-28 DIAGNOSIS — J309 Allergic rhinitis, unspecified: Secondary | ICD-10-CM

## 2011-05-08 ENCOUNTER — Ambulatory Visit (INDEPENDENT_AMBULATORY_CARE_PROVIDER_SITE_OTHER): Payer: 59

## 2011-05-08 DIAGNOSIS — J309 Allergic rhinitis, unspecified: Secondary | ICD-10-CM

## 2011-05-15 ENCOUNTER — Ambulatory Visit (INDEPENDENT_AMBULATORY_CARE_PROVIDER_SITE_OTHER): Payer: 59

## 2011-05-15 DIAGNOSIS — J309 Allergic rhinitis, unspecified: Secondary | ICD-10-CM

## 2011-05-22 ENCOUNTER — Ambulatory Visit (INDEPENDENT_AMBULATORY_CARE_PROVIDER_SITE_OTHER): Payer: 59

## 2011-05-22 DIAGNOSIS — J309 Allergic rhinitis, unspecified: Secondary | ICD-10-CM

## 2011-06-12 ENCOUNTER — Ambulatory Visit (INDEPENDENT_AMBULATORY_CARE_PROVIDER_SITE_OTHER): Payer: 59

## 2011-06-12 DIAGNOSIS — J309 Allergic rhinitis, unspecified: Secondary | ICD-10-CM

## 2011-06-24 ENCOUNTER — Encounter: Payer: Self-pay | Admitting: Internal Medicine

## 2011-07-10 ENCOUNTER — Ambulatory Visit (INDEPENDENT_AMBULATORY_CARE_PROVIDER_SITE_OTHER): Payer: 59

## 2011-07-10 DIAGNOSIS — J309 Allergic rhinitis, unspecified: Secondary | ICD-10-CM

## 2011-07-14 ENCOUNTER — Telehealth: Payer: Self-pay | Admitting: Internal Medicine

## 2011-07-14 NOTE — Telephone Encounter (Signed)
LMTCB

## 2011-07-15 NOTE — Telephone Encounter (Signed)
This was not needed. Pt wasn't needing anything.

## 2011-07-15 NOTE — Telephone Encounter (Signed)
[id number] 

## 2011-07-24 ENCOUNTER — Ambulatory Visit (INDEPENDENT_AMBULATORY_CARE_PROVIDER_SITE_OTHER): Payer: 59

## 2011-07-24 DIAGNOSIS — J309 Allergic rhinitis, unspecified: Secondary | ICD-10-CM

## 2011-08-07 ENCOUNTER — Ambulatory Visit (INDEPENDENT_AMBULATORY_CARE_PROVIDER_SITE_OTHER): Payer: 59

## 2011-08-07 DIAGNOSIS — J309 Allergic rhinitis, unspecified: Secondary | ICD-10-CM

## 2011-08-10 ENCOUNTER — Ambulatory Visit: Payer: 59

## 2011-08-14 ENCOUNTER — Ambulatory Visit (INDEPENDENT_AMBULATORY_CARE_PROVIDER_SITE_OTHER): Payer: 59

## 2011-08-14 DIAGNOSIS — J309 Allergic rhinitis, unspecified: Secondary | ICD-10-CM

## 2011-08-21 ENCOUNTER — Ambulatory Visit (INDEPENDENT_AMBULATORY_CARE_PROVIDER_SITE_OTHER): Payer: 59

## 2011-08-21 DIAGNOSIS — J309 Allergic rhinitis, unspecified: Secondary | ICD-10-CM

## 2011-08-28 ENCOUNTER — Ambulatory Visit (INDEPENDENT_AMBULATORY_CARE_PROVIDER_SITE_OTHER): Payer: 59

## 2011-08-28 DIAGNOSIS — J309 Allergic rhinitis, unspecified: Secondary | ICD-10-CM

## 2011-08-31 ENCOUNTER — Ambulatory Visit (INDEPENDENT_AMBULATORY_CARE_PROVIDER_SITE_OTHER): Payer: 59

## 2011-08-31 DIAGNOSIS — J309 Allergic rhinitis, unspecified: Secondary | ICD-10-CM

## 2011-09-03 ENCOUNTER — Ambulatory Visit (INDEPENDENT_AMBULATORY_CARE_PROVIDER_SITE_OTHER): Payer: 59

## 2011-09-03 ENCOUNTER — Encounter: Payer: Self-pay | Admitting: Internal Medicine

## 2011-09-03 ENCOUNTER — Ambulatory Visit (INDEPENDENT_AMBULATORY_CARE_PROVIDER_SITE_OTHER): Payer: 59 | Admitting: Internal Medicine

## 2011-09-03 VITALS — BP 112/68 | HR 76 | Ht 60.0 in | Wt 190.8 lb

## 2011-09-03 DIAGNOSIS — J301 Allergic rhinitis due to pollen: Secondary | ICD-10-CM

## 2011-09-03 DIAGNOSIS — J309 Allergic rhinitis, unspecified: Secondary | ICD-10-CM

## 2011-09-03 DIAGNOSIS — J45909 Unspecified asthma, uncomplicated: Secondary | ICD-10-CM

## 2011-09-03 MED ORDER — METHYLPREDNISOLONE ACETATE 80 MG/ML IJ SUSP
80.0000 mg | Freq: Once | INTRAMUSCULAR | Status: AC
  Administered 2011-09-03: 80 mg via INTRAMUSCULAR

## 2011-09-03 MED ORDER — PSEUDOEPHEDRINE HCL 30 MG PO TABS: 30.0000 mg | ORAL_TABLET | Freq: Four times a day (QID) | ORAL | Status: AC | PRN

## 2011-09-03 MED ORDER — PHENYLEPHRINE HCL 1 % NA SOLN
3.0000 [drp] | Freq: Once | NASAL | Status: AC
  Administered 2011-09-03: 3 [drp] via NASAL

## 2011-09-03 NOTE — Progress Notes (Signed)
Subjective:    Patient ID: [name], female    DOB: 1950/12/11, 60 y.o.   MRN: [id number]  HPI 55 yoF never smoker, followed for allergy and asthma. Last here September 08, 2010 having had recent bronchitis. Mild winter with no major problems. In the last 3 weeks has blamed pollen for waking with head congestion. Blowing nose, ears stopped up.Chest ok and denies infection. Once upright in the morning and after claritin she is ok the rest of the day. Saline nasal spray helps. Decongestant sprays cause nose bleed.  Chest has done very well with Symbicort. At least 3 weeks since last used Proair rescue inhaler. Stress at work sometimes makes her tight.  09/03/11-  60 yoF never smoker, followed for allergy and asthma. For the last 2 days has had maxillary pressure and upper chest congestion. People at work have been sneezing and coughing. She has not checked her temperature but feels a little flushed. Nothing purulent or painful. Continues Symbicort and uses rescue inhaler about twice a week. No problems with allergy vaccine. Has had flu vaccine. Continues as stressed as she works on her divorce.  Review of Systems- see HPI Constitutional:   No-   weight loss, night sweats, fevers, chills, fatigue, lassitude. HEENT:   No-  headaches, difficulty swallowing, tooth/dental problems, sore throat,       No-  sneezing, itching, ear ache, +nasal congestion, post nasal drip,  CV:  No-   chest pain, orthopnea, PND, swelling in lower extremities, anasarca, diizziness, palpitations Resp: No-   shortness of breath with exertion or at rest.              No-   productive cough,  No non-productive cough,  No- coughing up of blood.              No-   change in color of mucus.  Occcasional wheezing.   Skin: No-   rash or lesions. GI:  No-   heartburn, indigestion, abdominal pain, nausea, vomiting, diarrhea,                 change in bowel habits, loss of appetite GU: No-   dysuria, change in color of urine, no  urgency or frequency.  No- flank pain. MS:  No-   joint pain or swelling.  No- decreased range of motion.  No- back pain. Neuro-     nothing unusual Psych:  No- change in mood or affect. No depression or anxiety.  No memory loss.       Objective:   Physical Exam General- Alert, Oriented, Affect-appropriate, Distress- none acute. Obese Skin- rash-none, lesions- none, excoriation- none Lymphadenopathy- none Head- atraumatic            Eyes- Gross vision intact, PERRLA, conjunctivae clear secretions            Ears- Hearing, canals-normal            Nose- Clear, no-Septal dev, mucus, polyps, erosion, perforation             Throat- Mallampati II , mucosa clear , drainage- none, tonsils- atrophic Neck- flexible , trachea midline, no stridor , thyroid nl, carotid no bruit Chest - symmetrical excursion , unlabored           Heart/CV- RRR , no murmur , no gallop  , no rub, nl s1 s2                           -  JVD- none , edema- none, stasis changes- none, varices- none           Lung- clear to P&A, wheeze- none, cough- none , dullness-none, rub- none           Chest wall-  Abd- tender-no, distended-no, bowel sounds-present, HSM- no Br/ Gen/ Rectal- Not done, not indicated Extrem- cyanosis- none, clubbing, none, atrophy- none, strength- nl Neuro- grossly intact to observation

## 2011-09-03 NOTE — Patient Instructions (Addendum)
Neb neo nasal  Depo 80  When your nose is stuffy, consider trying some sudafed  - script sent

## 2011-09-04 ENCOUNTER — Telehealth: Payer: Self-pay | Admitting: Internal Medicine

## 2011-09-04 MED ORDER — AZITHROMYCIN 250 MG PO TABS: ORAL_TABLET | ORAL | Status: AC

## 2011-09-04 NOTE — Telephone Encounter (Signed)
Per CY:  Offer z pak and also suggest Mucinex.

## 2011-09-04 NOTE — Telephone Encounter (Signed)
Pt states she woke up this morning and feels like her chest is congested. She c/o a dry, hacky cough and says she cannot bring up any mucus. Nasal congestion is better since getting nasal neb yesterday at OV. She denies any fever, sore throat, sob or wheezing. She believes she needs an abx. Pls advise. Allergies  Allergen Reactions  . Doxycycline     REACTION: jittery

## 2011-09-04 NOTE — Telephone Encounter (Signed)
RX for Zpak sent to [facility] on [address].  Pt's notified of CDY recs.

## 2011-09-06 NOTE — Assessment & Plan Note (Signed)
Generally good control. 

## 2011-09-06 NOTE — Assessment & Plan Note (Signed)
The current complaint comes late in the season for allergy and is more likely a viral URI as discussed.

## 2011-09-18 ENCOUNTER — Ambulatory Visit (INDEPENDENT_AMBULATORY_CARE_PROVIDER_SITE_OTHER): Payer: 59

## 2011-09-18 DIAGNOSIS — J309 Allergic rhinitis, unspecified: Secondary | ICD-10-CM

## 2011-09-25 ENCOUNTER — Ambulatory Visit (INDEPENDENT_AMBULATORY_CARE_PROVIDER_SITE_OTHER): Payer: 59

## 2011-09-25 DIAGNOSIS — J309 Allergic rhinitis, unspecified: Secondary | ICD-10-CM

## 2011-10-05 ENCOUNTER — Ambulatory Visit (INDEPENDENT_AMBULATORY_CARE_PROVIDER_SITE_OTHER): Payer: 59 | Admitting: Family Medicine

## 2011-10-05 ENCOUNTER — Encounter: Payer: Self-pay | Admitting: Family Medicine

## 2011-10-05 VITALS — BP 118/70 | HR 92 | Temp 98.9°F | Wt 186.0 lb

## 2011-10-05 DIAGNOSIS — B356 Tinea cruris: Secondary | ICD-10-CM

## 2011-10-05 MED ORDER — KETOCONAZOLE 2 % EX CREA: TOPICAL_CREAM | Freq: Every day | CUTANEOUS | Status: AC

## 2011-10-05 NOTE — Progress Notes (Signed)
  Subjective:    Patient ID: [name], female    DOB: 1951/07/21, 60 y.o.   MRN: [id number]  HPI Here for an itchy rash under the belly and in both groins that has been present for over a year. Trying steroid creams with no improvement.    Review of Systems  Constitutional: Negative.   Respiratory: Negative.   Cardiovascular: Negative.   Skin: Positive for rash.       Objective:   Physical Exam  Constitutional: She appears well-developed and well-nourished.  Skin:       Widespread macular pink rashes in the groins           Assessment & Plan:  Keep areas as dry as possible.

## 2011-10-16 ENCOUNTER — Ambulatory Visit (INDEPENDENT_AMBULATORY_CARE_PROVIDER_SITE_OTHER): Payer: 59

## 2011-10-16 DIAGNOSIS — J309 Allergic rhinitis, unspecified: Secondary | ICD-10-CM

## 2011-11-06 ENCOUNTER — Ambulatory Visit (INDEPENDENT_AMBULATORY_CARE_PROVIDER_SITE_OTHER): Payer: 59

## 2011-11-06 DIAGNOSIS — J309 Allergic rhinitis, unspecified: Secondary | ICD-10-CM

## 2011-11-13 ENCOUNTER — Ambulatory Visit (INDEPENDENT_AMBULATORY_CARE_PROVIDER_SITE_OTHER): Payer: 59

## 2011-11-13 DIAGNOSIS — J309 Allergic rhinitis, unspecified: Secondary | ICD-10-CM

## 2011-11-24 ENCOUNTER — Encounter: Payer: Self-pay | Admitting: Internal Medicine

## 2011-12-07 ENCOUNTER — Encounter: Payer: Self-pay | Admitting: Family Medicine

## 2011-12-07 ENCOUNTER — Ambulatory Visit (INDEPENDENT_AMBULATORY_CARE_PROVIDER_SITE_OTHER): Payer: 59 | Admitting: Family Medicine

## 2011-12-07 VITALS — BP 104/60 | HR 74 | Temp 98.8°F | Wt 190.0 lb

## 2011-12-07 DIAGNOSIS — J329 Chronic sinusitis, unspecified: Secondary | ICD-10-CM

## 2011-12-07 MED ORDER — HYDROCODONE-HOMATROPINE 5-1.5 MG/5ML PO SYRP: 5.0000 mL | ORAL_SOLUTION | ORAL | Status: AC | PRN

## 2011-12-07 MED ORDER — AZITHROMYCIN 250 MG PO TABS: ORAL_TABLET | ORAL | Status: AC

## 2011-12-07 NOTE — Progress Notes (Signed)
  Subjective:    Patient ID: [name], female    DOB: 02-27-1951, 61 y.o.   MRN: [id number]  HPI Here for 3 days of sinus pressure, PND, ST, and a dry cough. No fever.    Review of Systems  Constitutional: Negative.   HENT: Positive for congestion, postnasal drip and sinus pressure.   Eyes: Negative.   Respiratory: Positive for cough.        Objective:   Physical Exam  Constitutional: She appears well-developed and well-nourished.  HENT:  Right Ear: External ear normal.  Left Ear: External ear normal.  Nose: Nose normal.  Mouth/Throat: Oropharynx is clear and moist. No oropharyngeal exudate.  Eyes: Conjunctivae are normal.  Pulmonary/Chest: Effort normal and breath sounds normal.  Lymphadenopathy:    She has no cervical adenopathy.          Assessment & Plan:  Recheck prn

## 2011-12-09 ENCOUNTER — Telehealth: Payer: Self-pay | Admitting: Family Medicine

## 2011-12-09 NOTE — Telephone Encounter (Signed)
Spoke with pt

## 2011-12-09 NOTE — Telephone Encounter (Signed)
There is nothing to call in for this. It is due to congestion. Have her try Mucinex D bid

## 2011-12-09 NOTE — Telephone Encounter (Signed)
Was seen on Monday. Since yesterday she has been experiencing vertigo, little nausea, no ear pain (she stated that she feels "drunk".) She refused ov. Patient would like something call to [facility]. Please call patient when done. Thanks.

## 2011-12-11 ENCOUNTER — Ambulatory Visit (INDEPENDENT_AMBULATORY_CARE_PROVIDER_SITE_OTHER): Payer: 59

## 2011-12-11 DIAGNOSIS — J309 Allergic rhinitis, unspecified: Secondary | ICD-10-CM

## 2011-12-18 ENCOUNTER — Ambulatory Visit (INDEPENDENT_AMBULATORY_CARE_PROVIDER_SITE_OTHER): Payer: 59

## 2011-12-18 DIAGNOSIS — J309 Allergic rhinitis, unspecified: Secondary | ICD-10-CM

## 2011-12-22 ENCOUNTER — Other Ambulatory Visit (INDEPENDENT_AMBULATORY_CARE_PROVIDER_SITE_OTHER): Payer: 59

## 2011-12-22 DIAGNOSIS — Z0000 Encounter for general adult medical examination without abnormal findings: Secondary | ICD-10-CM

## 2011-12-22 LAB — LIPID PANEL
Cholesterol: 227 mg/dL — ABNORMAL HIGH (ref 0–200)
Triglycerides: 191 mg/dL — ABNORMAL HIGH (ref 0.0–149.0)

## 2011-12-22 LAB — HEPATIC FUNCTION PANEL
Albumin: 4.1 g/dL (ref 3.5–5.2)
Alkaline Phosphatase: 63 U/L (ref 39–117)
Total Protein: 7.1 g/dL (ref 6.0–8.3)

## 2011-12-22 LAB — POCT URINALYSIS DIPSTICK
Bilirubin, UA: NEGATIVE
Ketones, UA: NEGATIVE
pH, UA: 6

## 2011-12-22 LAB — CBC WITH DIFFERENTIAL/PLATELET
Basophils Relative: 0.3 % (ref 0.0–3.0)
Eosinophils Absolute: 0.2 10*3/uL (ref 0.0–0.7)
Hemoglobin: 12.7 g/dL (ref 12.0–15.0)
MCHC: 33.5 g/dL (ref 30.0–36.0)
MCV: 88.8 fl (ref 78.0–100.0)
Monocytes Absolute: 0.3 10*3/uL (ref 0.1–1.0)
Neutro Abs: 4.6 10*3/uL (ref 1.4–7.7)
RBC: 4.25 Mil/uL (ref 3.87–5.11)

## 2011-12-22 LAB — BASIC METABOLIC PANEL
CO2: 28 mEq/L (ref 19–32)
Chloride: 107 mEq/L (ref 96–112)
Sodium: 143 mEq/L (ref 135–145)

## 2011-12-22 LAB — LDL CHOLESTEROL, DIRECT: Direct LDL: 143.7 mg/dL

## 2011-12-23 ENCOUNTER — Encounter: Payer: Self-pay | Admitting: Family Medicine

## 2011-12-23 NOTE — Progress Notes (Signed)
Quick Note:  Left voice message and put a copy of results in mail. ______ 

## 2011-12-25 ENCOUNTER — Ambulatory Visit (INDEPENDENT_AMBULATORY_CARE_PROVIDER_SITE_OTHER): Payer: 59

## 2011-12-25 DIAGNOSIS — J309 Allergic rhinitis, unspecified: Secondary | ICD-10-CM

## 2011-12-29 ENCOUNTER — Encounter: Payer: Self-pay | Admitting: Family Medicine

## 2011-12-29 ENCOUNTER — Ambulatory Visit (INDEPENDENT_AMBULATORY_CARE_PROVIDER_SITE_OTHER): Payer: 59 | Admitting: Family Medicine

## 2011-12-29 VITALS — BP 110/70 | HR 80 | Temp 98.7°F | Ht 61.25 in | Wt 190.0 lb

## 2011-12-29 DIAGNOSIS — Z0000 Encounter for general adult medical examination without abnormal findings: Secondary | ICD-10-CM

## 2011-12-29 DIAGNOSIS — K219 Gastro-esophageal reflux disease without esophagitis: Secondary | ICD-10-CM

## 2011-12-29 MED ORDER — LISINOPRIL-HYDROCHLOROTHIAZIDE 20-12.5 MG PO TABS: 1.0000 | ORAL_TABLET | Freq: Every day | ORAL | Status: DC

## 2011-12-29 MED ORDER — OMEPRAZOLE 20 MG PO CPDR: 20.0000 mg | DELAYED_RELEASE_CAPSULE | Freq: Every day | ORAL | Status: DC

## 2011-12-29 MED ORDER — POTASSIUM CHLORIDE CRYS ER 20 MEQ PO TBCR: 20.0000 meq | EXTENDED_RELEASE_TABLET | Freq: Every day | ORAL | Status: DC

## 2011-12-29 MED ORDER — SERTRALINE HCL 100 MG PO TABS: 100.0000 mg | ORAL_TABLET | Freq: Every day | ORAL | Status: DC

## 2011-12-29 NOTE — Progress Notes (Signed)
  Subjective:    Patient ID: [name], female    DOB: October 22, 1951, 61 y.o.   MRN: [id number]  HPI 61 yr old female for a cpx. She feels fine and has no concerns.    Review of Systems  Constitutional: Negative.   HENT: Negative.   Eyes: Negative.   Respiratory: Negative.   Cardiovascular: Negative.   Gastrointestinal: Negative.   Genitourinary: Negative for dysuria, urgency, frequency, hematuria, flank pain, decreased urine volume, enuresis, difficulty urinating, pelvic pain and dyspareunia.  Musculoskeletal: Negative.   Skin: Negative.   Neurological: Negative.   Hematological: Negative.   Psychiatric/Behavioral: Negative.        Objective:   Physical Exam  Constitutional: She is oriented to person, place, and time. She appears well-developed and well-nourished. No distress.  HENT:  Head: Normocephalic and atraumatic.  Right Ear: External ear normal.  Left Ear: External ear normal.  Nose: Nose normal.  Mouth/Throat: Oropharynx is clear and moist. No oropharyngeal exudate.  Eyes: Conjunctivae and EOM are normal. Pupils are equal, round, and reactive to light. No scleral icterus.  Neck: Normal range of motion. Neck supple. No JVD present. No thyromegaly present.  Cardiovascular: Normal rate, regular rhythm, normal heart sounds and intact distal pulses.  Exam reveals no gallop and no friction rub.   No murmur heard.      EKG normal   Pulmonary/Chest: Effort normal and breath sounds normal. No respiratory distress. She has no wheezes. She has no rales. She exhibits no tenderness.  Abdominal: Soft. Bowel sounds are normal. She exhibits no distension and no mass. There is no tenderness. There is no rebound and no guarding.  Musculoskeletal: Normal range of motion. She exhibits no edema and no tenderness.  Lymphadenopathy:    She has no cervical adenopathy.  Neurological: She is alert and oriented to person, place, and time. She has normal reflexes. No cranial nerve deficit.  She exhibits normal muscle tone. Coordination normal.  Skin: Skin is warm and dry. No rash noted. No erythema.  Psychiatric: She has a normal mood and affect. Her behavior is normal. Judgment and thought content normal.          Assessment & Plan:  Well exam.

## 2012-01-01 ENCOUNTER — Ambulatory Visit (INDEPENDENT_AMBULATORY_CARE_PROVIDER_SITE_OTHER): Payer: 59

## 2012-01-01 DIAGNOSIS — J309 Allergic rhinitis, unspecified: Secondary | ICD-10-CM

## 2012-01-08 ENCOUNTER — Ambulatory Visit (INDEPENDENT_AMBULATORY_CARE_PROVIDER_SITE_OTHER): Payer: 59

## 2012-01-08 DIAGNOSIS — J309 Allergic rhinitis, unspecified: Secondary | ICD-10-CM

## 2012-01-18 ENCOUNTER — Other Ambulatory Visit: Payer: Self-pay | Admitting: Internal Medicine

## 2012-01-22 ENCOUNTER — Other Ambulatory Visit: Payer: Self-pay | Admitting: Allergy

## 2012-01-22 ENCOUNTER — Ambulatory Visit (INDEPENDENT_AMBULATORY_CARE_PROVIDER_SITE_OTHER): Payer: 59

## 2012-01-22 DIAGNOSIS — J309 Allergic rhinitis, unspecified: Secondary | ICD-10-CM

## 2012-01-22 MED ORDER — ALBUTEROL SULFATE HFA 108 (90 BASE) MCG/ACT IN AERS: 2.0000 | INHALATION_SPRAY | Freq: Four times a day (QID) | RESPIRATORY_TRACT | Status: DC | PRN

## 2012-01-22 MED ORDER — BUDESONIDE-FORMOTEROL FUMARATE 80-4.5 MCG/ACT IN AERO: 2.0000 | INHALATION_SPRAY | Freq: Two times a day (BID) | RESPIRATORY_TRACT | Status: DC

## 2012-01-22 NOTE — Telephone Encounter (Signed)
PT NEEDS RX SENT TO [facility]

## 2012-01-29 ENCOUNTER — Ambulatory Visit (INDEPENDENT_AMBULATORY_CARE_PROVIDER_SITE_OTHER): Payer: 59

## 2012-01-29 DIAGNOSIS — J309 Allergic rhinitis, unspecified: Secondary | ICD-10-CM

## 2012-02-05 ENCOUNTER — Emergency Department (INDEPENDENT_AMBULATORY_CARE_PROVIDER_SITE_OTHER)
Admission: EM | Admit: 2012-02-05 | Discharge: 2012-02-05 | Disposition: A | Discharge status: Home Health | Payer: 59 | Source: Home / Self Care | Attending: Emergency Medicine | Admitting: Emergency Medicine

## 2012-02-05 ENCOUNTER — Encounter (HOSPITAL_COMMUNITY): Payer: Self-pay

## 2012-02-05 DIAGNOSIS — J019 Acute sinusitis, unspecified: Secondary | ICD-10-CM

## 2012-02-05 MED ORDER — FLUTICASONE PROPIONATE 50 MCG/ACT NA SUSP: 2.0000 | Freq: Every day | NASAL | Status: DC

## 2012-02-05 MED ORDER — AMOXICILLIN 500 MG PO CAPS: 1000.0000 mg | ORAL_CAPSULE | Freq: Three times a day (TID) | ORAL | Status: AC

## 2012-02-05 NOTE — ED Notes (Signed)
Pt c/o sinus pain since Monday.  Pt taking mucinex and OTC sinus/allergy med with no relief.  Pt denies fever.

## 2012-02-05 NOTE — ED Provider Notes (Signed)
Chief Complaint  Patient presents with  . Sinusitis    History of Present Illness:   [name] is a 61 year old female with a one-week history of nasal congestion with greenish, bloody drainage, facial pain, headache, ear pain, postnasal drainage, scratchy throat, and cough. She denies any fever or chills she has a history of high blood pressure, gastroesophageal reflux, and asthma.  Review of Systems:  Other than noted above, the patient denies any of the following symptoms. Systemic:  No fever, chills, sweats, fatigue, myalgias, headache, or anorexia. Eye:  No redness, pain or drainage. ENT:  No earache, nasal congestion, rhinorrhea, sinus pressure, or sore throat. Lungs:  No cough, sputum production, wheezing, shortness of breath. Or chest pain. GI:  No nausea, vomiting, abdominal pain or diarrhea. Skin:  No rash or itching.  PMFSH:  Past medical history, family history, social history, meds, and allergies were reviewed.  Physical Exam:   Vital signs:  BP 142/97  Pulse 86  Temp(Src) 98.8 F (37.1 C) (Oral)  Resp 18  Ht 5\' 1"  (1.549 m)  Wt 192 lb (87.091 kg)  BMI 36.28 kg/m2  SpO2 98% General:  Alert, in no distress. Eye:  No conjunctival injection or drainage. ENT:  TMs and canals were normal, without erythema or inflammation.  Nasal mucosa was clear and uncongested, without drainage.  Mucous membranes were moist.  Pharynx was clear, without exudate or drainage.  There were no oral ulcerations or lesions. Neck:  Supple, no adenopathy, tenderness or mass. Lungs:  No respiratory distress.  Lungs were clear to auscultation, without wheezes, rales or rhonchi.  Breath sounds were clear and equal bilaterally. Heart:  Regular rhythm, without gallops, murmers or rubs. Skin:  Clear, warm, and dry, without rash or lesions.  Assessment:   Diagnoses that have been ruled out:  None  Diagnoses that are still under consideration:  None  Final diagnoses:  Acute sinusitis      Plan:     1.  The following meds were prescribed:   New Prescriptions   AMOXICILLIN (AMOXIL) 500 MG CAPSULE    Take 2 capsules (1,000 mg total) by mouth 3 (three) times daily.   FLUTICASONE (FLONASE) 50 MCG/ACT NASAL SPRAY    Place 2 sprays into the nose daily.   2.  The patient was instructed in symptomatic care and handouts were given. 3.  The patient was told to return if becoming worse in any way, if no better in 3 or 4 days, and given some red flag symptoms that would indicate earlier return.   Reuben Likes, MD 02/05/12 2123

## 2012-02-05 NOTE — Discharge Instructions (Signed)

## 2012-02-12 ENCOUNTER — Ambulatory Visit (INDEPENDENT_AMBULATORY_CARE_PROVIDER_SITE_OTHER): Payer: 59

## 2012-02-12 DIAGNOSIS — J309 Allergic rhinitis, unspecified: Secondary | ICD-10-CM

## 2012-02-19 ENCOUNTER — Ambulatory Visit (INDEPENDENT_AMBULATORY_CARE_PROVIDER_SITE_OTHER): Payer: 59

## 2012-02-19 DIAGNOSIS — J309 Allergic rhinitis, unspecified: Secondary | ICD-10-CM

## 2012-02-26 ENCOUNTER — Ambulatory Visit (INDEPENDENT_AMBULATORY_CARE_PROVIDER_SITE_OTHER): Payer: 59

## 2012-02-26 DIAGNOSIS — J309 Allergic rhinitis, unspecified: Secondary | ICD-10-CM

## 2012-03-02 ENCOUNTER — Ambulatory Visit (INDEPENDENT_AMBULATORY_CARE_PROVIDER_SITE_OTHER): Payer: 59

## 2012-03-02 DIAGNOSIS — J309 Allergic rhinitis, unspecified: Secondary | ICD-10-CM

## 2012-03-03 ENCOUNTER — Encounter: Payer: Self-pay | Admitting: Internal Medicine

## 2012-03-03 ENCOUNTER — Ambulatory Visit (INDEPENDENT_AMBULATORY_CARE_PROVIDER_SITE_OTHER)
Admission: RE | Admit: 2012-03-03 | Discharge: 2012-03-03 | Disposition: A | Discharge status: Home / Self Care | Payer: 59 | Source: Ambulatory Visit | Attending: Internal Medicine | Admitting: Internal Medicine

## 2012-03-03 ENCOUNTER — Ambulatory Visit (INDEPENDENT_AMBULATORY_CARE_PROVIDER_SITE_OTHER): Payer: 59 | Admitting: Internal Medicine

## 2012-03-03 ENCOUNTER — Ambulatory Visit (INDEPENDENT_AMBULATORY_CARE_PROVIDER_SITE_OTHER): Payer: 59

## 2012-03-03 ENCOUNTER — Ambulatory Visit: Payer: 59 | Admitting: Internal Medicine

## 2012-03-03 VITALS — BP 118/80 | HR 65 | Ht 60.0 in | Wt 189.4 lb

## 2012-03-03 DIAGNOSIS — J301 Allergic rhinitis due to pollen: Secondary | ICD-10-CM

## 2012-03-03 DIAGNOSIS — R05 Cough: Secondary | ICD-10-CM

## 2012-03-03 DIAGNOSIS — R059 Cough, unspecified: Secondary | ICD-10-CM

## 2012-03-03 DIAGNOSIS — J45909 Unspecified asthma, uncomplicated: Secondary | ICD-10-CM

## 2012-03-03 DIAGNOSIS — J309 Allergic rhinitis, unspecified: Secondary | ICD-10-CM

## 2012-03-03 DIAGNOSIS — J4520 Mild intermittent asthma, uncomplicated: Secondary | ICD-10-CM

## 2012-03-03 MED ORDER — FLUTICASONE PROPIONATE 50 MCG/ACT NA SUSP: 2.0000 | Freq: Every day | NASAL | Status: DC

## 2012-03-03 NOTE — Progress Notes (Signed)
Subjective:    Patient ID: [name], female    DOB: 06/09/1951, 61 y.o.   MRN: [id number]  HPI 61 yoF never smoker, followed for allergy and asthma. Last here September 08, 2010 having had recent bronchitis. Mild winter with no major problems. In the last 3 weeks has blamed pollen for waking with head congestion. Blowing nose, ears stopped up.Chest ok and denies infection. Once upright in the morning and after claritin she is ok the rest of the day. Saline nasal spray helps. Decongestant sprays cause nose bleed.  Chest has done very well with Symbicort. At least 3 weeks since last used Proair rescue inhaler. Stress at work sometimes makes her tight.  09/03/11-  61 yoF never smoker, followed for allergy and asthma. For the last 2 days has had maxillary pressure and upper chest congestion. People at work have been sneezing and coughing. She has not checked her temperature but feels a little flushed. Nothing purulent or painful. Continues Symbicort and uses rescue inhaler about twice a week. No problems with allergy vaccine. Has had flu vaccine. Continues as stressed as she works on her divorce.  03/03/12- 61 yoF never smoker, followed for allergy and asthma. Went to urgent care for sinusitis 3 or 4 weeks ago. Improved but wants prescription for Flonase. Continues allergy vaccine. Asthma under good control with no wheezing and rare need for rescue inhaler. Persistent mild hacking cough. We discussed this because of her lisinopril and left it for her to decide whether it was a concern to discuss with her primary physician.  Review of Systems- see HPI Constitutional:   No-   weight loss, night sweats, fevers, chills, fatigue, lassitude. HEENT:   No-  headaches, difficulty swallowing, tooth/dental problems, sore throat,       No-  sneezing, itching, ear ache, +nasal congestion, post nasal drip,  CV:  No-   chest pain, orthopnea, PND, swelling in lower extremities, anasarca, dizziness,  palpitations Resp: No-   shortness of breath with exertion or at rest.              No-   productive cough,  No non-productive cough,  No- coughing up of blood.              No-   change in color of mucus.  Occcasional wheezing.   Skin: No-   rash or lesions. GI:  No-   heartburn, indigestion, abdominal pain, nausea, vomiting,  GU:  MS:  No-   joint pain or swelling.   Neuro-     nothing unusual Psych:  No- change in mood or affect. No depression or anxiety.  No memory loss.       Objective:   Physical Exam General- Alert, Oriented, Affect-appropriate, Distress- none acute. Obese Skin- rash-none, lesions- none, excoriation- none Lymphadenopathy- none Head- atraumatic            Eyes- Gross vision intact, PERRLA, conjunctivae clear secretions            Ears- Hearing, canals-normal            Nose- Clear, no-Septal dev, mucus, polyps, erosion, perforation             Throat- Mallampati II , mucosa clear , drainage- none, tonsils- atrophic Neck- flexible , trachea midline, no stridor , thyroid nl, carotid no bruit Chest - symmetrical excursion , unlabored           Heart/CV- RRR , no murmur , no gallop  , no  rub, nl s1 s2                           - JVD- none , edema- none, stasis changes- none, varices- none           Lung- clear to P&A, wheeze- none, cough- none , dullness-none, rub- none           Chest wall-  Abd-  Br/ Gen/ Rectal- Not done, not indicated Extrem- cyanosis- none, clubbing, none, atrophy- none, strength- nl Neuro- grossly intact to observation

## 2012-03-03 NOTE — Patient Instructions (Signed)
Continue allergy vaccine  Script sent for fluticasone nasal spray   Order CXR  Dx cough

## 2012-03-06 NOTE — Assessment & Plan Note (Signed)
Mild hacking cough might be from her asthma, from GERD or from her lisinopril/ACE inhibitor. Plan-chest x-ray

## 2012-03-06 NOTE — Assessment & Plan Note (Signed)
Continues vaccine without problems. Needs Flonase now in spring pollen season.

## 2012-03-07 ENCOUNTER — Telehealth: Payer: Self-pay | Admitting: Internal Medicine

## 2012-03-07 NOTE — Progress Notes (Signed)
Quick Note:  Pt aware of results. ______ 

## 2012-03-07 NOTE — Telephone Encounter (Signed)
Pt called back about her CXR results; patient aware.

## 2012-03-07 NOTE — Progress Notes (Signed)
Quick Note:  LMTCB ______ 

## 2012-03-11 ENCOUNTER — Ambulatory Visit (INDEPENDENT_AMBULATORY_CARE_PROVIDER_SITE_OTHER): Payer: 59

## 2012-03-11 DIAGNOSIS — J309 Allergic rhinitis, unspecified: Secondary | ICD-10-CM

## 2012-03-18 ENCOUNTER — Ambulatory Visit (INDEPENDENT_AMBULATORY_CARE_PROVIDER_SITE_OTHER): Payer: 59

## 2012-03-18 DIAGNOSIS — J309 Allergic rhinitis, unspecified: Secondary | ICD-10-CM

## 2012-03-22 ENCOUNTER — Encounter: Payer: Self-pay | Admitting: Internal Medicine

## 2012-03-23 ENCOUNTER — Ambulatory Visit (INDEPENDENT_AMBULATORY_CARE_PROVIDER_SITE_OTHER): Payer: 59

## 2012-03-23 DIAGNOSIS — J309 Allergic rhinitis, unspecified: Secondary | ICD-10-CM

## 2012-03-31 ENCOUNTER — Other Ambulatory Visit: Payer: Self-pay | Admitting: Obstetrics and Gynecology

## 2012-03-31 DIAGNOSIS — Z780 Asymptomatic menopausal state: Secondary | ICD-10-CM

## 2012-04-01 ENCOUNTER — Ambulatory Visit (INDEPENDENT_AMBULATORY_CARE_PROVIDER_SITE_OTHER): Payer: 59

## 2012-04-01 ENCOUNTER — Encounter: Payer: Self-pay | Admitting: Family Medicine

## 2012-04-01 DIAGNOSIS — J309 Allergic rhinitis, unspecified: Secondary | ICD-10-CM

## 2012-04-08 ENCOUNTER — Ambulatory Visit (INDEPENDENT_AMBULATORY_CARE_PROVIDER_SITE_OTHER): Payer: 59

## 2012-04-08 DIAGNOSIS — J309 Allergic rhinitis, unspecified: Secondary | ICD-10-CM

## 2012-04-15 ENCOUNTER — Ambulatory Visit (INDEPENDENT_AMBULATORY_CARE_PROVIDER_SITE_OTHER): Payer: 59

## 2012-04-15 DIAGNOSIS — J309 Allergic rhinitis, unspecified: Secondary | ICD-10-CM

## 2012-04-21 ENCOUNTER — Other Ambulatory Visit: Payer: 59

## 2012-04-22 ENCOUNTER — Ambulatory Visit (INDEPENDENT_AMBULATORY_CARE_PROVIDER_SITE_OTHER): Payer: 59

## 2012-04-22 DIAGNOSIS — J309 Allergic rhinitis, unspecified: Secondary | ICD-10-CM

## 2012-04-29 ENCOUNTER — Ambulatory Visit (INDEPENDENT_AMBULATORY_CARE_PROVIDER_SITE_OTHER): Payer: 59

## 2012-04-29 ENCOUNTER — Telehealth: Payer: Self-pay | Admitting: Family Medicine

## 2012-04-29 DIAGNOSIS — J309 Allergic rhinitis, unspecified: Secondary | ICD-10-CM

## 2012-04-29 NOTE — Telephone Encounter (Signed)
I left the below message. 

## 2012-04-29 NOTE — Telephone Encounter (Signed)
We haven't gotten the results yet

## 2012-04-29 NOTE — Telephone Encounter (Signed)
Pt would like bone density test results °

## 2012-05-06 ENCOUNTER — Ambulatory Visit (INDEPENDENT_AMBULATORY_CARE_PROVIDER_SITE_OTHER): Payer: 59

## 2012-05-06 DIAGNOSIS — J309 Allergic rhinitis, unspecified: Secondary | ICD-10-CM

## 2012-05-13 ENCOUNTER — Ambulatory Visit (INDEPENDENT_AMBULATORY_CARE_PROVIDER_SITE_OTHER): Payer: 59

## 2012-05-13 DIAGNOSIS — J309 Allergic rhinitis, unspecified: Secondary | ICD-10-CM

## 2012-05-16 ENCOUNTER — Encounter: Payer: Self-pay | Admitting: Family

## 2012-05-16 ENCOUNTER — Ambulatory Visit (INDEPENDENT_AMBULATORY_CARE_PROVIDER_SITE_OTHER): Payer: 59 | Admitting: Family

## 2012-05-16 VITALS — BP 110/78 | HR 87 | Temp 98.7°F | Wt 185.0 lb

## 2012-05-16 DIAGNOSIS — F438 Other reactions to severe stress: Secondary | ICD-10-CM

## 2012-05-16 DIAGNOSIS — S29011A Strain of muscle and tendon of front wall of thorax, initial encounter: Secondary | ICD-10-CM

## 2012-05-16 DIAGNOSIS — IMO0002 Reserved for concepts with insufficient information to code with codable children: Secondary | ICD-10-CM

## 2012-05-16 DIAGNOSIS — F4389 Other reactions to severe stress: Secondary | ICD-10-CM

## 2012-05-16 DIAGNOSIS — F430 Acute stress reaction: Secondary | ICD-10-CM

## 2012-05-16 MED ORDER — MELOXICAM 15 MG PO TABS: 15.0000 mg | ORAL_TABLET | Freq: Every day | ORAL | Status: DC

## 2012-05-16 MED ORDER — CYCLOBENZAPRINE HCL 10 MG PO TABS: 10.0000 mg | ORAL_TABLET | Freq: Two times a day (BID) | ORAL | Status: AC | PRN

## 2012-05-16 NOTE — Progress Notes (Signed)
Subjective:    Patient ID: [name], female    DOB: 03-02-1951, 61 y.o.   MRN: [id number]  HPI 61 year old white female, nonsmoker, patient of Dr. Clent Ridges is in today with complaints of a possible pulled muscle to her right chest and right upper arm after lifting a heavy bowling ball. She describes the pain as a dull ache, Rate pain 4/10, worse with movement. The pain is also especially worse first thing in the morning. She's taken ibuprofen that has helped her symptoms. Patient denies any lightheadedness, dizziness, shortness of breath, nausea, vomiting or diaphoresis   Patient is also going through a separation from her husband.   Review of Systems  Constitutional: Negative.   Respiratory: Positive for chest tightness. Negative for cough, shortness of breath and wheezing.   Cardiovascular: Negative.   Gastrointestinal: Negative.  Negative for nausea and vomiting.  Musculoskeletal: Positive for myalgias. Negative for back pain and arthralgias.       Pain to the right chest with numbness to the right upper arm.   Neurological: Negative.  Negative for dizziness and light-headedness.  Psychiatric/Behavioral: Negative.    Past Medical History  Diagnosis Date  . Allergy   . GERD (gastroesophageal reflux disease)   . Hypertension   . Depression   . Gynecological examination     sees Dr. Huel Cote  . Asthma     sees Dr. Jetty Duhamel    History   Social History  . Marital Status: Legally Separated    Spouse Name: N/A    Number of Children: N/A  . Years of Education: N/A   Occupational History  . Not on file.   Social History Main Topics  . Smoking status: Never Smoker   . Smokeless tobacco: Never Used  . Alcohol Use: No  . Drug Use: No  . Sexually Active: Not on file   Other Topics Concern  . Not on file   Social History Narrative  . No narrative on file    Past Surgical History  Procedure Date  . Carpal tunnel release     right wrist   . Colonoscopy  06-27-09    per Dr. Juanda Chance, repeat in 5 yrs     Family History  Problem Relation Age of Onset  . Cancer Mother   . Cancer Father   . Diabetes Sister   . Cancer Brother   . Alcohol abuse      family hx  . Hypertension      family hx  . Stroke      family hx    Allergies  Allergen Reactions  . Doxycycline     REACTION: jittery    Current Outpatient Prescriptions on File Prior to Visit  Medication Sig Dispense Refill  . albuterol (PROVENTIL HFA;VENTOLIN HFA) 108 (90 BASE) MCG/ACT inhaler Inhale 2 puffs into the lungs every 6 (six) hours as needed. Pro Air  3 Inhaler  4  . aspirin 81 MG tablet Take 81 mg by mouth daily.        . budesonide-formoterol (SYMBICORT) 80-4.5 MCG/ACT inhaler Inhale 2 puffs into the lungs 2 (two) times daily.  3 Inhaler  3  . Flaxseed, Linseed, (FLAX SEED OIL) 1000 MG CAPS Take 1 capsule by mouth daily.        . fluticasone (FLONASE) 50 MCG/ACT nasal spray Place 2 sprays into the nose daily. At bedtime  16 g  prn  . ketoconazole (NIZORAL) 2 % cream Apply topically daily.  60 g  5  . lisinopril-hydrochlorothiazide (PRINZIDE,ZESTORETIC) 20-12.5 MG per tablet Take 1 tablet by mouth daily.  90 tablet  3  . NON FORMULARY Allergy vaccine. Once a week       . omeprazole (PRILOSEC) 20 MG capsule Take 1 capsule (20 mg total) by mouth daily.  90 capsule  3  . potassium chloride SA (K-DUR,KLOR-CON) 20 MEQ tablet Take 1 tablet (20 mEq total) by mouth daily.  90 tablet  3  . pyridOXINE (VITAMIN B-6) 100 MG tablet Take 100 mg by mouth daily.        . sertraline (ZOLOFT) 100 MG tablet Take 1 tablet (100 mg total) by mouth daily.  90 tablet  3  . loratadine (CLARITIN) 10 MG tablet Take 10 mg by mouth daily.          BP 110/78  Pulse 87  Temp 98.7 F (37.1 C) (Oral)  Wt 185 lb (83.915 kg)  SpO2 98%chart    Objective:   Physical Exam  Constitutional: She is oriented to person, place, and time. She appears well-developed and well-nourished.  Cardiovascular: Normal  rate, regular rhythm and normal heart sounds.   Pulmonary/Chest: Effort normal and breath sounds normal.       Pain is reproducible to the right chest with palpation.  Musculoskeletal: Normal range of motion.  Neurological: She is alert and oriented to person, place, and time.  Skin: Skin is warm and dry.  Psychiatric: She has a normal mood and affect.          Assessment & Plan:  Assessment: Muscle strain, right chest discomfort  Plan: Mobic 50 mg once daily. Flexeril 10 mg to be taken immediately after work and at bedtime. Ice to the affected area. Patient call the office symptoms worsen or persist. Recheck a schedule, when necessary. Rest.

## 2012-05-16 NOTE — Patient Instructions (Signed)
Muscle Strain A muscle strain, or pulled muscle, occurs when a muscle is over-stretched. A small number of muscle fibers may also be torn. This is especially common in athletes. This happens when a sudden violent force placed on a muscle pushes it past its capacity. Usually, recovery from a pulled muscle takes 1 to 2 weeks. But complete healing will take 5 to 6 weeks. There are millions of muscle fibers. Following injury, your body will usually return to normal quickly. HOME CARE INSTRUCTIONS   While awake, apply ice to the sore muscle for 15 to 20 minutes each hour for the first 2 days. Put ice in a plastic bag and place a towel between the bag of ice and your skin.   Do not use the pulled muscle for several days. Do not use the muscle if you have pain.   You may wrap the injured area with an elastic bandage for comfort. Be careful not to bind it too tightly. This may interfere with blood circulation.   Only take over-the-counter or prescription medicines for pain, discomfort, or fever as directed by your caregiver. Do not use aspirin as this will increase bleeding (bruising) at injury site.   Warming up before exercise helps prevent muscle strains.  SEEK MEDICAL CARE IF:  There is increased pain or swelling in the affected area. MAKE SURE YOU:   Understand these instructions.   Will watch your condition.   Will get help right away if you are not doing well or get worse.  Document Released: 10/26/2005 Document Revised: 10/15/2011 Document Reviewed: 05/25/2007 [employer] Patient Information 2012 [employer]. 

## 2012-05-20 ENCOUNTER — Ambulatory Visit (INDEPENDENT_AMBULATORY_CARE_PROVIDER_SITE_OTHER): Payer: 59

## 2012-05-20 DIAGNOSIS — J309 Allergic rhinitis, unspecified: Secondary | ICD-10-CM

## 2012-05-27 ENCOUNTER — Ambulatory Visit (INDEPENDENT_AMBULATORY_CARE_PROVIDER_SITE_OTHER): Payer: 59

## 2012-05-27 DIAGNOSIS — J309 Allergic rhinitis, unspecified: Secondary | ICD-10-CM

## 2012-06-03 ENCOUNTER — Ambulatory Visit (INDEPENDENT_AMBULATORY_CARE_PROVIDER_SITE_OTHER): Payer: 59

## 2012-06-03 DIAGNOSIS — J309 Allergic rhinitis, unspecified: Secondary | ICD-10-CM

## 2012-06-09 LAB — HM DEXA SCAN: HM DEXA SCAN: NORMAL

## 2012-06-10 ENCOUNTER — Ambulatory Visit (INDEPENDENT_AMBULATORY_CARE_PROVIDER_SITE_OTHER): Payer: 59

## 2012-06-10 DIAGNOSIS — J309 Allergic rhinitis, unspecified: Secondary | ICD-10-CM

## 2012-06-17 ENCOUNTER — Ambulatory Visit (INDEPENDENT_AMBULATORY_CARE_PROVIDER_SITE_OTHER): Payer: 59

## 2012-06-17 DIAGNOSIS — J309 Allergic rhinitis, unspecified: Secondary | ICD-10-CM

## 2012-06-21 ENCOUNTER — Ambulatory Visit (INDEPENDENT_AMBULATORY_CARE_PROVIDER_SITE_OTHER): Payer: 59

## 2012-06-21 DIAGNOSIS — J309 Allergic rhinitis, unspecified: Secondary | ICD-10-CM

## 2012-06-24 ENCOUNTER — Ambulatory Visit (INDEPENDENT_AMBULATORY_CARE_PROVIDER_SITE_OTHER): Payer: 59

## 2012-06-24 DIAGNOSIS — J309 Allergic rhinitis, unspecified: Secondary | ICD-10-CM

## 2012-06-28 ENCOUNTER — Encounter: Payer: Self-pay | Admitting: Internal Medicine

## 2012-07-08 ENCOUNTER — Ambulatory Visit (INDEPENDENT_AMBULATORY_CARE_PROVIDER_SITE_OTHER): Payer: 59

## 2012-07-08 DIAGNOSIS — J309 Allergic rhinitis, unspecified: Secondary | ICD-10-CM

## 2012-07-15 ENCOUNTER — Ambulatory Visit (INDEPENDENT_AMBULATORY_CARE_PROVIDER_SITE_OTHER): Payer: 59

## 2012-07-15 DIAGNOSIS — J309 Allergic rhinitis, unspecified: Secondary | ICD-10-CM

## 2012-07-22 ENCOUNTER — Ambulatory Visit (INDEPENDENT_AMBULATORY_CARE_PROVIDER_SITE_OTHER): Payer: 59

## 2012-07-22 DIAGNOSIS — J309 Allergic rhinitis, unspecified: Secondary | ICD-10-CM

## 2012-07-29 ENCOUNTER — Ambulatory Visit (INDEPENDENT_AMBULATORY_CARE_PROVIDER_SITE_OTHER): Payer: 59

## 2012-07-29 DIAGNOSIS — J309 Allergic rhinitis, unspecified: Secondary | ICD-10-CM

## 2012-08-05 ENCOUNTER — Ambulatory Visit (INDEPENDENT_AMBULATORY_CARE_PROVIDER_SITE_OTHER): Payer: 59

## 2012-08-05 DIAGNOSIS — J309 Allergic rhinitis, unspecified: Secondary | ICD-10-CM

## 2012-08-12 ENCOUNTER — Ambulatory Visit (INDEPENDENT_AMBULATORY_CARE_PROVIDER_SITE_OTHER): Payer: 59

## 2012-08-12 ENCOUNTER — Encounter: Payer: Self-pay | Admitting: Family Medicine

## 2012-08-12 ENCOUNTER — Ambulatory Visit (INDEPENDENT_AMBULATORY_CARE_PROVIDER_SITE_OTHER): Payer: 59 | Admitting: Family Medicine

## 2012-08-12 VITALS — BP 110/68 | HR 84 | Temp 98.4°F | Wt 187.0 lb

## 2012-08-12 DIAGNOSIS — J309 Allergic rhinitis, unspecified: Secondary | ICD-10-CM

## 2012-08-12 DIAGNOSIS — H6980 Other specified disorders of Eustachian tube, unspecified ear: Secondary | ICD-10-CM

## 2012-08-12 MED ORDER — METHYLPREDNISOLONE ACETATE 80 MG/ML IJ SUSP
120.0000 mg | Freq: Once | INTRAMUSCULAR | Status: AC
  Administered 2012-08-12: 120 mg via INTRAMUSCULAR

## 2012-08-12 NOTE — Progress Notes (Signed)
  Subjective:    Patient ID: [name], female    DOB: 1951/01/03, 61 y.o.   MRN: [id number]  HPI Here for the onset this morning of pressure in both ears, muffles hearing in both ears, and dizziness. No pain or HA. No fever or ST or cough.    Review of Systems  Constitutional: Negative.   HENT: Positive for hearing loss and congestion. Negative for ear pain, sore throat, neck stiffness, postnasal drip, sinus pressure, tinnitus and ear discharge.   Eyes: Negative.   Respiratory: Negative.   Neurological: Positive for dizziness. Negative for tremors, seizures, syncope, facial asymmetry, speech difficulty, weakness, light-headedness, numbness and headaches.       Objective:   Physical Exam  Constitutional: She is oriented to person, place, and time. She appears well-developed and well-nourished.  HENT:  Head: Normocephalic and atraumatic.  Right Ear: External ear normal.  Left Ear: External ear normal.  Nose: Nose normal.  Mouth/Throat: Oropharynx is clear and moist.  Eyes: Conjunctivae normal and EOM are normal. Pupils are equal, round, and reactive to light.  Neck: Neck supple. No thyromegaly present.  Neurological: She is alert and oriented to person, place, and time. She has normal reflexes. No cranial nerve deficit. She exhibits normal muscle tone. Coordination normal.          Assessment & Plan:  Given a steroid shot. Add Mucinex bid

## 2012-08-12 NOTE — Addendum Note (Signed)
Addended by: Aniceto Boss A on: 08/12/2012 02:12 PM   Modules accepted: Orders

## 2012-08-26 ENCOUNTER — Ambulatory Visit (INDEPENDENT_AMBULATORY_CARE_PROVIDER_SITE_OTHER): Payer: 59

## 2012-08-26 DIAGNOSIS — J309 Allergic rhinitis, unspecified: Secondary | ICD-10-CM

## 2012-09-02 ENCOUNTER — Encounter: Payer: Self-pay | Admitting: Internal Medicine

## 2012-09-02 ENCOUNTER — Ambulatory Visit (INDEPENDENT_AMBULATORY_CARE_PROVIDER_SITE_OTHER): Payer: 59

## 2012-09-02 ENCOUNTER — Ambulatory Visit (INDEPENDENT_AMBULATORY_CARE_PROVIDER_SITE_OTHER): Payer: 59 | Admitting: Internal Medicine

## 2012-09-02 VITALS — BP 116/72 | HR 77 | Ht 60.0 in | Wt 187.0 lb

## 2012-09-02 DIAGNOSIS — J45909 Unspecified asthma, uncomplicated: Secondary | ICD-10-CM

## 2012-09-02 DIAGNOSIS — J309 Allergic rhinitis, unspecified: Secondary | ICD-10-CM

## 2012-09-02 DIAGNOSIS — J301 Allergic rhinitis due to pollen: Secondary | ICD-10-CM

## 2012-09-02 DIAGNOSIS — J4520 Mild intermittent asthma, uncomplicated: Secondary | ICD-10-CM

## 2012-09-02 NOTE — Progress Notes (Signed)
Subjective:    Patient ID: [name], female    DOB: 04-Feb-1951, 60 y.o.   MRN: [id number]  HPI 63 yoF never smoker, followed for allergy and asthma. Last here September 08, 2010 having had recent bronchitis. Mild winter with no major problems. In the last 3 weeks has blamed pollen for waking with head congestion. Blowing nose, ears stopped up.Chest ok and denies infection. Once upright in the morning and after claritin she is ok the rest of the day. Saline nasal spray helps. Decongestant sprays cause nose bleed.  Chest has done very well with Symbicort. At least 3 weeks since last used Proair rescue inhaler. Stress at work sometimes makes her tight.  09/03/11-  60 yoF never smoker, followed for allergy and asthma. For the last 2 days has had maxillary pressure and upper chest congestion. People at work have been sneezing and coughing. She has not checked her temperature but feels a little flushed. Nothing purulent or painful. Continues Symbicort and uses rescue inhaler about twice a week. No problems with allergy vaccine. Has had flu vaccine. Continues as stressed as she works on her divorce.  03/03/12- 60 yoF never smoker, followed for allergy and asthma. Went to urgent care for sinusitis 3 or 4 weeks ago. Improved but wants prescription for Flonase. Continues allergy vaccine. Asthma under good control with no wheezing and rare need for rescue inhaler. Persistent mild hacking cough. We discussed this because of her lisinopril and left it for her to decide whether it was a concern to discuss with her primary physician.  09/02/12- 60 yoF never smoker, followed for allergy and asthma. Still on allergy vaccine 1:10 GH and doing well. Denies any flare ups at this time. Had flu vaccine. Minor cough on lisinopril. She considers it is significant. Not much rhinitis this fall. CXR 03/03/12 IMPRESSION:  1. No radiographic evidence of acute cardiopulmonary disease.  Original Report Authenticated By:  Florencia Reasons, M.D.   Review of Systems- see HPI Constitutional:   No-   weight loss, night sweats, fevers, chills, fatigue, lassitude. HEENT:   No-  headaches, difficulty swallowing, tooth/dental problems, sore throat,       + sneezing, itching, ear ache, +nasal congestion, post nasal drip,  CV:  No-   chest pain, orthopnea, PND, swelling in lower extremities, anasarca, dizziness, palpitations Resp: No-   shortness of breath with exertion or at rest.              No-   productive cough,  No non-productive cough,  No- coughing up of blood.              No-   change in color of mucus.  Occcasional wheezing.   Skin: No-   rash or lesions. GI:  No-   heartburn, indigestion, abdominal pain, nausea, vomiting,  GU:  MS:  No-   joint pain or swelling.   Neuro-     nothing unusual Psych:  No- change in mood or affect. No depression or anxiety.  No memory loss.    Objective:   Physical Exam General- Alert, Oriented, Affect-appropriate, Distress- none acute. Obese Skin- rash-none, lesions- none, excoriation- none Lymphadenopathy- none Head- atraumatic            Eyes- Gross vision intact, PERRLA, conjunctivae clear secretions            Ears- Hearing, canals-normal            Nose- Clear, no-Septal dev, mucus, polyps, erosion, perforation. Watery  sniffing            Throat- Mallampati II , mucosa clear , drainage- none, tonsils- atrophic Neck- flexible , trachea midline, no stridor , thyroid nl, carotid no bruit Chest - symmetrical excursion , unlabored           Heart/CV- RRR , no murmur , no gallop  , no rub, nl s1 s2                           - JVD- none , edema- none, stasis changes- none, varices- none           Lung- clear to P&A, wheeze- none, cough- none , dullness-none, rub- none           Chest wall-  Abd-  Br/ Gen/ Rectal- Not done, not indicated Extrem- cyanosis- none, clubbing, none, atrophy- none, strength- nl Neuro- grossly intact to observation

## 2012-09-02 NOTE — Patient Instructions (Addendum)
Please call as needed 

## 2012-09-16 ENCOUNTER — Ambulatory Visit (INDEPENDENT_AMBULATORY_CARE_PROVIDER_SITE_OTHER): Payer: 59

## 2012-09-16 DIAGNOSIS — J309 Allergic rhinitis, unspecified: Secondary | ICD-10-CM

## 2012-09-16 NOTE — Assessment & Plan Note (Signed)
Nothing additional needed now after discussion of options. Medications reviewed.

## 2012-09-16 NOTE — Assessment & Plan Note (Signed)
She is comfortable continuing allergy vaccine. No changes required.

## 2012-09-19 ENCOUNTER — Telehealth: Payer: Self-pay | Admitting: Family Medicine

## 2012-09-19 NOTE — Telephone Encounter (Signed)
Caller: [name]/Patient; Patient Name: [name]; PCP: Gershon Crane [facility]); Best Callback Phone Number: [phone]  Health hx verified per EPIC. Calling about hearing a 'roaring sound' in both ears. Onset 09/19/12 AM. Afebrile. 'Feels stopped up'.  Recent exposure to family w/ cold sxs and feels some nasal congestion. Last month seen for same sxs and give Cortisone and placed on Mucinex.  Questioning dose of Muciniex. All emergent sxs per Ear: Symptoms protocol ruled out with exception to 'Ear fullness/pressure along with sxs of a cold or dxed seasonal allergires. Home care advice given. Informed that dosage recommended for Mucinex not documented but to start with the 600 mg twice per day and avoid Mucinex D due to hx of hypertension. Also use saline spray.

## 2012-09-19 NOTE — Telephone Encounter (Signed)
Per Dr. Clent Ridges, take bid.

## 2012-09-19 NOTE — Telephone Encounter (Signed)
Pt was here 10/4. Cannot remember how many pills of Mucinex she was to take BID, and chart did not say. Please call her to clarify. Thanks.

## 2012-09-23 ENCOUNTER — Ambulatory Visit (INDEPENDENT_AMBULATORY_CARE_PROVIDER_SITE_OTHER): Payer: 59

## 2012-09-23 DIAGNOSIS — J309 Allergic rhinitis, unspecified: Secondary | ICD-10-CM

## 2012-10-14 ENCOUNTER — Ambulatory Visit (INDEPENDENT_AMBULATORY_CARE_PROVIDER_SITE_OTHER): Payer: 59

## 2012-10-14 DIAGNOSIS — J309 Allergic rhinitis, unspecified: Secondary | ICD-10-CM

## 2012-10-21 ENCOUNTER — Ambulatory Visit (INDEPENDENT_AMBULATORY_CARE_PROVIDER_SITE_OTHER): Payer: 59

## 2012-10-21 DIAGNOSIS — J309 Allergic rhinitis, unspecified: Secondary | ICD-10-CM

## 2012-11-08 ENCOUNTER — Encounter: Payer: Self-pay | Admitting: Internal Medicine

## 2012-11-11 ENCOUNTER — Ambulatory Visit (INDEPENDENT_AMBULATORY_CARE_PROVIDER_SITE_OTHER): Payer: 59

## 2012-11-11 DIAGNOSIS — J309 Allergic rhinitis, unspecified: Secondary | ICD-10-CM

## 2012-11-28 ENCOUNTER — Encounter: Payer: Self-pay | Admitting: Internal Medicine

## 2012-12-02 ENCOUNTER — Ambulatory Visit (INDEPENDENT_AMBULATORY_CARE_PROVIDER_SITE_OTHER): Payer: 59

## 2012-12-02 DIAGNOSIS — J309 Allergic rhinitis, unspecified: Secondary | ICD-10-CM

## 2012-12-08 ENCOUNTER — Encounter: Payer: Self-pay | Admitting: *Deleted

## 2012-12-09 ENCOUNTER — Ambulatory Visit (INDEPENDENT_AMBULATORY_CARE_PROVIDER_SITE_OTHER): Payer: 59

## 2012-12-09 DIAGNOSIS — J309 Allergic rhinitis, unspecified: Secondary | ICD-10-CM

## 2012-12-16 ENCOUNTER — Ambulatory Visit (INDEPENDENT_AMBULATORY_CARE_PROVIDER_SITE_OTHER): Payer: 59

## 2012-12-16 DIAGNOSIS — J309 Allergic rhinitis, unspecified: Secondary | ICD-10-CM

## 2012-12-19 ENCOUNTER — Ambulatory Visit (INDEPENDENT_AMBULATORY_CARE_PROVIDER_SITE_OTHER): Payer: 59

## 2012-12-19 DIAGNOSIS — J309 Allergic rhinitis, unspecified: Secondary | ICD-10-CM

## 2012-12-30 ENCOUNTER — Ambulatory Visit (INDEPENDENT_AMBULATORY_CARE_PROVIDER_SITE_OTHER): Payer: 59

## 2012-12-30 DIAGNOSIS — J309 Allergic rhinitis, unspecified: Secondary | ICD-10-CM

## 2013-01-06 ENCOUNTER — Ambulatory Visit (INDEPENDENT_AMBULATORY_CARE_PROVIDER_SITE_OTHER): Payer: 59

## 2013-01-06 DIAGNOSIS — J309 Allergic rhinitis, unspecified: Secondary | ICD-10-CM

## 2013-01-13 ENCOUNTER — Ambulatory Visit: Payer: 59

## 2013-01-13 ENCOUNTER — Other Ambulatory Visit: Payer: Self-pay | Admitting: Family Medicine

## 2013-01-17 ENCOUNTER — Ambulatory Visit (INDEPENDENT_AMBULATORY_CARE_PROVIDER_SITE_OTHER): Payer: 59

## 2013-01-17 ENCOUNTER — Ambulatory Visit (INDEPENDENT_AMBULATORY_CARE_PROVIDER_SITE_OTHER): Payer: 59 | Admitting: Internal Medicine

## 2013-01-17 ENCOUNTER — Encounter: Payer: Self-pay | Admitting: Internal Medicine

## 2013-01-17 VITALS — BP 132/64 | HR 80 | Ht 60.0 in | Wt 184.0 lb

## 2013-01-17 DIAGNOSIS — Z800 Family history of malignant neoplasm of digestive organs: Secondary | ICD-10-CM

## 2013-01-17 DIAGNOSIS — J309 Allergic rhinitis, unspecified: Secondary | ICD-10-CM

## 2013-01-17 DIAGNOSIS — K625 Hemorrhage of anus and rectum: Secondary | ICD-10-CM

## 2013-01-17 DIAGNOSIS — K648 Other hemorrhoids: Secondary | ICD-10-CM

## 2013-01-17 MED ORDER — HYDROCORTISONE ACETATE 25 MG RE SUPP: 25.0000 mg | Freq: Every day | RECTAL | Status: DC

## 2013-01-17 NOTE — Patient Instructions (Addendum)
We have sent the following medications to your pharmacy for you to pick up at your convenience: Anusol  Please follow up with Dr Juanda Chance in 6 weeks.  CC: Dr Gershon Crane

## 2013-01-17 NOTE — Progress Notes (Signed)
[name] [birth year] MRN [id number]   History of Present Illness:  This is a 62 year old white female with episodes of painless rectal bleeding approximately 4 weeks ago which occurred while at work. The blood was bright red and there was no pain associated with it. The blood was dripping into the commode. She has not had any recurrence of the bleeding. The bleeding occurred after an episode of diarrhea. Her bowel movements have been irregular. She alternates between constipation and diarrhea. There is a family history of colorectal cancer in both of her parents. She had a colonoscopy in 2002, 2007 which showed a colon polyp and in August 2010 which showed mild diverticulosis of the left colon. 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    Past Medical History  Diagnosis Date  . Allergy   . GERD (gastroesophageal reflux disease)   . Hypertension   . Depression   . Gynecological examination     sees Dr. Huel Cote  . Asthma     sees Dr. Jetty Duhamel  . Diverticulosis   . Colon polyp 2007  . Hyperlipemia    Past Surgical History  Procedure Laterality Date  . Carpal tunnel release      right wrist   . Colonoscopy  06-27-09    per Dr. Juanda Chance, repeat in 5 yrs     reports that she has never smoked. She has never used smokeless tobacco. She reports that she does not drink alcohol or use illicit drugs. family history includes Colon cancer in her father and mother; Diabetes in her sister; Liver cancer in her father; and Throat cancer in her brother. Allergies  Allergen Reactions  . Doxycycline     REACTION: jittery        Review of Systems: Denies heartburn nausea vomiting  The remainder of the 10 point ROS is negative except as outlined in H&P   Physical Exam: General appearance  Well developed, in no distress. Eyes- non icteric. HEENT nontraumatic, normocephalic. Mouth no lesions, tongue  papillated, no cheilosis. Neck supple without adenopathy, thyroid not enlarged, no carotid bruits, no JVD. Lungs Clear to auscultation bilaterally. Cor normal S1, normal S2, regular rhythm, no murmur,  quiet precordium. Abdomen: Soft nontender abdomen with normoactive bowel sounds. No distention. Liver edge at costal margin. Rectal: And anoscopic exam reveals saw first grade internal hemorrhoids x 3. No prolapse. No external hemorrhoids. Normal rectal sphincter tone. Stool is Hemoccult negative. Extremities no pedal edema. Skin no lesions. Neurological alert and oriented x 3. Psychological normal mood and affect.  Assessment and Plan:  Problem #1 One episode of painless rectal bleeding of bright red blood consistent with an anorectal source. She has first grade internal hemorrhoids on anoscopic exam today. The episode was precipitated by a sudden onset of diarrhea. We will treat her conservatively with Anusol-HC suppositories, one at bedtime and a high fiber diet. I would like to see her in 6 weeks and if the bleeding continues, we will proceed with a colonoscopy and possible hemorrhoidal band ligation. We have discussed the band ligation and decided to wait and treat the hemorrhoids first.   01/17/2013 [name]

## 2013-01-20 ENCOUNTER — Ambulatory Visit: Payer: 59

## 2013-01-24 ENCOUNTER — Other Ambulatory Visit: Payer: Self-pay | Admitting: Family Medicine

## 2013-01-27 ENCOUNTER — Ambulatory Visit (INDEPENDENT_AMBULATORY_CARE_PROVIDER_SITE_OTHER): Payer: 59

## 2013-01-27 DIAGNOSIS — J309 Allergic rhinitis, unspecified: Secondary | ICD-10-CM

## 2013-01-30 ENCOUNTER — Encounter: Payer: Self-pay | Admitting: Internal Medicine

## 2013-01-30 ENCOUNTER — Ambulatory Visit (INDEPENDENT_AMBULATORY_CARE_PROVIDER_SITE_OTHER): Payer: 59

## 2013-01-30 ENCOUNTER — Telehealth: Payer: Self-pay | Admitting: Internal Medicine

## 2013-01-30 ENCOUNTER — Ambulatory Visit (INDEPENDENT_AMBULATORY_CARE_PROVIDER_SITE_OTHER): Payer: 59 | Admitting: Internal Medicine

## 2013-01-30 VITALS — BP 124/78 | HR 61 | Ht 61.0 in | Wt 189.6 lb

## 2013-01-30 DIAGNOSIS — J45901 Unspecified asthma with (acute) exacerbation: Secondary | ICD-10-CM

## 2013-01-30 DIAGNOSIS — J4520 Mild intermittent asthma, uncomplicated: Secondary | ICD-10-CM

## 2013-01-30 DIAGNOSIS — J45909 Unspecified asthma, uncomplicated: Secondary | ICD-10-CM

## 2013-01-30 DIAGNOSIS — J309 Allergic rhinitis, unspecified: Secondary | ICD-10-CM

## 2013-01-30 MED ORDER — METHYLPREDNISOLONE ACETATE 80 MG/ML IJ SUSP
80.0000 mg | Freq: Once | INTRAMUSCULAR | Status: AC
  Administered 2013-01-30: 80 mg via INTRAMUSCULAR

## 2013-01-30 MED ORDER — LEVALBUTEROL HCL 0.63 MG/3ML IN NEBU
0.6300 mg | INHALATION_SOLUTION | Freq: Once | RESPIRATORY_TRACT | Status: AC
  Administered 2013-01-30: 0.63 mg via RESPIRATORY_TRACT

## 2013-01-30 NOTE — Telephone Encounter (Signed)
Per CY-have patient come by for Xopenex breathing treatment and Depo 80 injection. Pt is aware to be here today at 11:30am.

## 2013-01-30 NOTE — Progress Notes (Signed)
  Subjective:    Patient ID: [name], female    DOB: 1951/04/18, 62 y.o.   MRN: [id number]  HPI 51 yoF never smoker, followed for allergy and asthma. Last here September 08, 2010 having had recent bronchitis. Mild winter with no major problems. In the last 3 weeks has blamed pollen for waking with head congestion. Blowing nose, ears stopped up.Chest ok and denies infection. Once upright in the morning and after claritin she is ok the rest of the day. Saline nasal spray helps. Decongestant sprays cause nose bleed.  Chest has done very well with Symbicort. At least 3 weeks since last used Proair rescue inhaler. Stress at work sometimes makes her tight.  09/03/11-  60 yoF never smoker, followed for allergy and asthma. For the last 2 days has had maxillary pressure and upper chest congestion. People at work have been sneezing and coughing. She has not checked her temperature but feels a little flushed. Nothing purulent or painful. Continues Symbicort and uses rescue inhaler about twice a week. No problems with allergy vaccine. Has had flu vaccine. Continues as stressed as she works on her divorce.  03/03/12- 60 yoF never smoker, followed for allergy and asthma. Went to urgent care for sinusitis 3 or 4 weeks ago. Improved but wants prescription for Flonase. Continues allergy vaccine. Asthma under good control with no wheezing and rare need for rescue inhaler. Persistent mild hacking cough. We discussed this because of her lisinopril and left it for her to decide whether it was a concern to discuss with her primary physician.  09/02/12- 60 yoF never smoker, followed for allergy and asthma. Still on allergy vaccine 1:10 GH and doing well. Denies any flare ups at this time. Had flu vaccine. Minor cough on lisinopril. She considers it is significant. Not much rhinitis this fall. CXR 03/03/12 IMPRESSION:  1. No radiographic evidence of acute cardiopulmonary disease.  Original Report Authenticated By:  Florencia Reasons, M.D.   01/30/13- 64 yoF never smoker, followed for allergy and asthma. ACUTE VISIT:chest congestion; wheezing, asthma attack last night.  Still on allergy vaccine 1:10 GH  Review of Systems- see HPI . HEENT:   No-  headaches, sore throat,       No-nasal congestion, post nasal drip,  CV:  No-   chest pain, orthopnea, PND, swelling in lower extremities,  dizziness, palpitations Resp: No-   shortness of breath with exertion or at rest.              No-   productive cough,  + non-productive cough,  No- coughing up of blood.              No-   change in color of mucus.  + wheezing.   .    Objective:   Physical Exam General- Alert, Oriented, Affect-appropriate, Distress- none acute. Obese  Chest - symmetrical excursion , unlabored           Heart/CV- RRR , no murmur , no gallop  , no rub, nl s1 s2                           - JVD- none , edema- none, stasis changes- none, varices- none           Lung- clear to P&A, wheeze- none, cough- none , dullness-none, rub- none

## 2013-01-30 NOTE — Telephone Encounter (Signed)
Called, spoke with pt.  States last night she was changing the filters in her heating system.  Reports she had an asthma attack during this.  States she felt like she was "closing up."  Used proair with no relief, so she tried symbicort with some relief.  This am she still feels like she is only breathing "half way up," feels like there is mucus in her chest, has wheezing, chest tightness, and nonprod cough.  Does not have home nebs.  Requesting to come in today for a breathing tx.  Dr. Maple Hudson, pls advise if pt can be worked in. Thank you.    **Note:  TP not in office today.  Last OV with CDY 09/02/13; asked to f/u in 6 months. Pending OV with CDY 03/03/13  [facility] on [address] Allergies verified with pt: Allergies  Allergen Reactions  . Doxycycline     REACTION: jittery

## 2013-01-30 NOTE — Assessment & Plan Note (Signed)
Acute exacerbation. Now already clearer with neb.Discussed treatment options, steroids.

## 2013-01-30 NOTE — Patient Instructions (Signed)
Neb xop 0.63, depo 80  Please call as needed before next appointment.

## 2013-02-07 ENCOUNTER — Other Ambulatory Visit (INDEPENDENT_AMBULATORY_CARE_PROVIDER_SITE_OTHER): Payer: 59

## 2013-02-07 DIAGNOSIS — Z0000 Encounter for general adult medical examination without abnormal findings: Secondary | ICD-10-CM

## 2013-02-07 LAB — TSH: TSH: 1.72 u[IU]/mL (ref 0.35–5.50)

## 2013-02-07 LAB — POCT URINALYSIS DIPSTICK
Nitrite, UA: NEGATIVE
Urobilinogen, UA: 1
pH, UA: 5.5

## 2013-02-07 LAB — BASIC METABOLIC PANEL
BUN: 18 mg/dL (ref 6–23)
CO2: 27 mEq/L (ref 19–32)
Chloride: 104 mEq/L (ref 96–112)
Potassium: 4 mEq/L (ref 3.5–5.1)

## 2013-02-07 LAB — HEPATIC FUNCTION PANEL
ALT: 13 U/L (ref 0–35)
Total Protein: 7.3 g/dL (ref 6.0–8.3)

## 2013-02-07 LAB — CBC WITH DIFFERENTIAL/PLATELET
Basophils Relative: 0.5 % (ref 0.0–3.0)
Eosinophils Relative: 2.1 % (ref 0.0–5.0)
HCT: 37 % (ref 36.0–46.0)
Lymphs Abs: 1.9 10*3/uL (ref 0.7–4.0)
MCHC: 33.2 g/dL (ref 30.0–36.0)
MCV: 86 fl (ref 78.0–100.0)
Monocytes Absolute: 0.4 10*3/uL (ref 0.1–1.0)
Platelets: 225 10*3/uL (ref 150.0–400.0)
WBC: 7.7 10*3/uL (ref 4.5–10.5)

## 2013-02-10 ENCOUNTER — Ambulatory Visit (INDEPENDENT_AMBULATORY_CARE_PROVIDER_SITE_OTHER): Payer: 59

## 2013-02-10 ENCOUNTER — Ambulatory Visit: Payer: 59

## 2013-02-10 DIAGNOSIS — J309 Allergic rhinitis, unspecified: Secondary | ICD-10-CM

## 2013-02-14 ENCOUNTER — Encounter: Payer: Self-pay | Admitting: Family Medicine

## 2013-02-14 ENCOUNTER — Ambulatory Visit (INDEPENDENT_AMBULATORY_CARE_PROVIDER_SITE_OTHER): Payer: 59 | Admitting: Family Medicine

## 2013-02-14 VITALS — BP 110/60 | HR 86 | Temp 98.3°F | Ht 60.75 in | Wt 184.0 lb

## 2013-02-14 DIAGNOSIS — Z0000 Encounter for general adult medical examination without abnormal findings: Secondary | ICD-10-CM

## 2013-02-14 MED ORDER — OMEPRAZOLE 20 MG PO CPDR: 20.0000 mg | DELAYED_RELEASE_CAPSULE | Freq: Every day | ORAL | Status: DC

## 2013-02-14 MED ORDER — POTASSIUM CHLORIDE CRYS ER 20 MEQ PO TBCR: 20.0000 meq | EXTENDED_RELEASE_TABLET | Freq: Every day | ORAL | Status: DC

## 2013-02-14 MED ORDER — LISINOPRIL-HYDROCHLOROTHIAZIDE 20-12.5 MG PO TABS: ORAL_TABLET | ORAL | Status: DC

## 2013-02-14 MED ORDER — SERTRALINE HCL 100 MG PO TABS: 100.0000 mg | ORAL_TABLET | Freq: Every day | ORAL | Status: DC

## 2013-02-14 NOTE — Progress Notes (Signed)
  Subjective:    Patient ID: [name], female    DOB: 1951/07/31, 62 y.o.   MRN: [id number]  HPI 62 yr old female for a cpx. She feels well and has no complaints.    Review of Systems  Constitutional: Negative.   HENT: Negative.   Eyes: Negative.   Respiratory: Negative.   Cardiovascular: Negative.   Gastrointestinal: Negative.   Genitourinary: Negative for dysuria, urgency, frequency, hematuria, flank pain, decreased urine volume, enuresis, difficulty urinating, pelvic pain and dyspareunia.  Musculoskeletal: Negative.   Skin: Negative.   Neurological: Negative.   Psychiatric/Behavioral: Negative.        Objective:   Physical Exam  Constitutional: She is oriented to person, place, and time. She appears well-developed and well-nourished. No distress.  HENT:  Head: Normocephalic and atraumatic.  Right Ear: External ear normal.  Left Ear: External ear normal.  Nose: Nose normal.  Mouth/Throat: Oropharynx is clear and moist. No oropharyngeal exudate.  Eyes: Conjunctivae and EOM are normal. Pupils are equal, round, and reactive to light. No scleral icterus.  Neck: Normal range of motion. Neck supple. No JVD present. No thyromegaly present.  Cardiovascular: Normal rate, regular rhythm, normal heart sounds and intact distal pulses.  Exam reveals no gallop and no friction rub.   No murmur heard. EKG normal  Pulmonary/Chest: Effort normal and breath sounds normal. No respiratory distress. She has no wheezes. She has no rales. She exhibits no tenderness.  Abdominal: Soft. Bowel sounds are normal. She exhibits no distension and no mass. There is no tenderness. There is no rebound and no guarding.  Musculoskeletal: Normal range of motion. She exhibits no edema and no tenderness.  Lymphadenopathy:    She has no cervical adenopathy.  Neurological: She is alert and oriented to person, place, and time. She has normal reflexes. No cranial nerve deficit. She exhibits normal muscle tone.  Coordination normal.  Skin: Skin is warm and dry. No rash noted. No erythema.  Psychiatric: She has a normal mood and affect. Her behavior is normal. Judgment and thought content normal.          Assessment & Plan:  Well exam. She needs more exercise

## 2013-02-17 ENCOUNTER — Ambulatory Visit (INDEPENDENT_AMBULATORY_CARE_PROVIDER_SITE_OTHER): Payer: 59

## 2013-02-17 DIAGNOSIS — J309 Allergic rhinitis, unspecified: Secondary | ICD-10-CM

## 2013-02-20 ENCOUNTER — Telehealth: Payer: Self-pay | Admitting: Family Medicine

## 2013-02-20 NOTE — Telephone Encounter (Signed)
PT calling to request that her lisinopril-hydrochlorothiazide (PRINZIDE,ZESTORETIC) 20-12.5 MG per tablet, and omeprazole (PRILOSEC) 20 MG capsule, be sent to [facility]. Please assist.

## 2013-02-21 MED ORDER — OMEPRAZOLE 20 MG PO CPDR: 20.0000 mg | DELAYED_RELEASE_CAPSULE | Freq: Every day | ORAL | Status: DC

## 2013-02-21 MED ORDER — LISINOPRIL-HYDROCHLOROTHIAZIDE 20-12.5 MG PO TABS: ORAL_TABLET | ORAL | Status: DC

## 2013-02-21 NOTE — Telephone Encounter (Signed)
I sent scripts e-scribe to [facility] per pt request.

## 2013-02-23 ENCOUNTER — Ambulatory Visit: Payer: 59

## 2013-03-01 ENCOUNTER — Ambulatory Visit (INDEPENDENT_AMBULATORY_CARE_PROVIDER_SITE_OTHER): Payer: 59 | Admitting: Internal Medicine

## 2013-03-01 ENCOUNTER — Encounter: Payer: Self-pay | Admitting: Internal Medicine

## 2013-03-01 ENCOUNTER — Other Ambulatory Visit (INDEPENDENT_AMBULATORY_CARE_PROVIDER_SITE_OTHER): Payer: 59

## 2013-03-01 VITALS — BP 128/60 | HR 74 | Ht 60.0 in | Wt 188.0 lb

## 2013-03-01 DIAGNOSIS — R197 Diarrhea, unspecified: Secondary | ICD-10-CM

## 2013-03-01 DIAGNOSIS — K589 Irritable bowel syndrome without diarrhea: Secondary | ICD-10-CM

## 2013-03-01 LAB — SEDIMENTATION RATE: Sed Rate: 84 mm/hr — ABNORMAL HIGH (ref 0–22)

## 2013-03-01 MED ORDER — HYOSCYAMINE SULFATE 0.125 MG SL SUBL: SUBLINGUAL_TABLET | SUBLINGUAL | Status: DC

## 2013-03-01 MED ORDER — HYDROCORTISONE ACETATE 25 MG RE SUPP: 25.0000 mg | Freq: Every day | RECTAL | Status: DC

## 2013-03-01 NOTE — Patient Instructions (Addendum)
Your physician has requested that you go to the basement for the following lab work before leaving today: Celiac 10 Panel, Sed Rate  We have sent the following medications to your pharmacy for you to pick up at your convenience: Anusol Suppositories Levsin SL  CC: Dr Gershon Crane

## 2013-03-01 NOTE — Progress Notes (Signed)
[name] [birth year] MRN [id number]   History of Present Illness:  This is a 62 year old white female who is seen today in follow up from 01/2013 appointment for rectal bleeding which stopped after she used Anusol-HC suppositories x 12. Her hemorrhoids are under good control. She requests refills on the suppositories. Another problem has been intermittent diarrhea and incontinence. Every 2 or 3 weeks, she experiences episodes of fecal urgency and loose stool which is difficult to control. She denies constipation.  She has been going through a divorce. Her weight has been stable. Both of her parents had colorectal cancer. She has had numerous colonoscopies starting in 1999. Her most recent exam was in August 2010 and she is due for a repeat colonoscopy in August 2015. She is taking flaxseed as a fiber supplement. She is also on Prilosec for gastroesophageal reflux.   Past Medical History  Diagnosis Date  . Allergy   . GERD (gastroesophageal reflux disease)   . Hypertension   . Depression   . Gynecological examination     sees Dr. Huel Cote  . Asthma     sees Dr. Jetty Duhamel  . Diverticulosis   . Colon polyp 2007  . Hyperlipemia    Past Surgical History  Procedure Laterality Date  . Carpal tunnel release      right wrist   . Colonoscopy  06-27-09    per Dr. Juanda Chance, repeat in 5 yrs     reports that she has never smoked. She has never used smokeless tobacco. She reports that she uses illicit drugs. She reports that she does not drink alcohol. family history includes Colon cancer in her father and mother; Diabetes in her sister; Liver cancer in her father; and Throat cancer in her brother. Allergies  Allergen Reactions  . Doxycycline     REACTION: jittery        Review of Systems: Denies heartburn chest pain  The remainder of the 10 point ROS is negative except as outlined in H&P   Physical Exam: General appearance  Well developed, in no distress he is  overweight Eyes- non icteric. HEENT nontraumatic, normocephalic. Mouth no lesions, tongue papillated, no cheilosis. Dentures Neck supple without adenopathy, thyroid not enlarged, no carotid bruits, no JVD. Lungs Clear to auscultation bilaterally. Cor normal S1, normal S2, regular rhythm, no murmur,  quiet precordium. Abdomen: Soft obese nontender with minimal discomfort in the left lower quadrant. Hyperactive bowel sounds tympany. Rectal: Not repeated. Extremities no pedal edema. Skin no lesions. Neurological alert and oriented x 3. Psychological normal mood and affect.  Assessment and Plan:  Problem #1 This is a 62 year old white female with irritable bowel syndrome who is going through divorce. We will add Levsin sublingual 0.125 mg every morning and when necessary for crampy abdominal pain. She will continue fiber supplements. We will also renew her Anusol-HC suppositories. She will be due for a colonoscopy in August 2015. We will check a sprue profile and sedimentation rate today.   03/01/2013 [name]

## 2013-03-02 ENCOUNTER — Other Ambulatory Visit: Payer: Self-pay | Admitting: *Deleted

## 2013-03-02 DIAGNOSIS — R700 Elevated erythrocyte sedimentation rate: Secondary | ICD-10-CM

## 2013-03-02 LAB — CELIAC PANEL 10
Gliadin IgG: 3.4 U/mL (ref <20)
Tissue Transglut Ab: 11.3 U/mL (ref <20)
Tissue Transglutaminase Ab, IgA: 2.9 U/mL (ref <20)

## 2013-03-03 ENCOUNTER — Encounter: Payer: Self-pay | Admitting: Internal Medicine

## 2013-03-03 ENCOUNTER — Ambulatory Visit (INDEPENDENT_AMBULATORY_CARE_PROVIDER_SITE_OTHER): Payer: 59

## 2013-03-03 ENCOUNTER — Other Ambulatory Visit (INDEPENDENT_AMBULATORY_CARE_PROVIDER_SITE_OTHER): Payer: 59

## 2013-03-03 ENCOUNTER — Ambulatory Visit (INDEPENDENT_AMBULATORY_CARE_PROVIDER_SITE_OTHER): Payer: 59 | Admitting: Internal Medicine

## 2013-03-03 VITALS — BP 114/68 | HR 59 | Ht 61.0 in | Wt 191.2 lb

## 2013-03-03 DIAGNOSIS — J209 Acute bronchitis, unspecified: Secondary | ICD-10-CM

## 2013-03-03 DIAGNOSIS — J309 Allergic rhinitis, unspecified: Secondary | ICD-10-CM

## 2013-03-03 DIAGNOSIS — R700 Elevated erythrocyte sedimentation rate: Secondary | ICD-10-CM

## 2013-03-03 DIAGNOSIS — J301 Allergic rhinitis due to pollen: Secondary | ICD-10-CM

## 2013-03-03 LAB — C-REACTIVE PROTEIN: CRP: <0.5 mg/dL (ref 0.5–20.0)

## 2013-03-03 NOTE — Patient Instructions (Addendum)
We can continue allergy vaccine 1:10 GH  Please call as needed 

## 2013-03-03 NOTE — Progress Notes (Signed)
Subjective:    Patient ID: [name], female    DOB: 10-06-1951, 62 y.o.   MRN: [id number]  HPI 85 yoF never smoker, followed for allergy and asthma. Last here September 08, 2010 having had recent bronchitis. Mild winter with no major problems. In the last 3 weeks has blamed pollen for waking with head congestion. Blowing nose, ears stopped up.Chest ok and denies infection. Once upright in the morning and after claritin she is ok the rest of the day. Saline nasal spray helps. Decongestant sprays cause nose bleed.  Chest has done very well with Symbicort. At least 3 weeks since last used Proair rescue inhaler. Stress at work sometimes makes her tight.  09/03/11-  60 yoF never smoker, followed for allergy and asthma. For the last 2 days has had maxillary pressure and upper chest congestion. People at work have been sneezing and coughing. She has not checked her temperature but feels a little flushed. Nothing purulent or painful. Continues Symbicort and uses rescue inhaler about twice a week. No problems with allergy vaccine. Has had flu vaccine. Continues as stressed as she works on her divorce.  03/03/12- 60 yoF never smoker, followed for allergy and asthma. Went to urgent care for sinusitis 3 or 4 weeks ago. Improved but wants prescription for Flonase. Continues allergy vaccine. Asthma under good control with no wheezing and rare need for rescue inhaler. Persistent mild hacking cough. We discussed this because of her lisinopril and left it for her to decide whether it was a concern to discuss with her primary physician.  09/02/12- 60 yoF never smoker, followed for allergy and asthma. Still on allergy vaccine 1:10 GH and doing well. Denies any flare ups at this time. Had flu vaccine. Minor cough on lisinopril. She considers it is significant. Not much rhinitis this fall. CXR 03/03/12 IMPRESSION:  1. No radiographic evidence of acute cardiopulmonary disease.  Original Report Authenticated By:  Florencia Reasons, M.D.   01/30/13- 74 yoF never smoker, followed for allergy and asthma. ACUTE VISIT:chest congestion; wheezing, asthma attack last night.  Still on allergy vaccine 1:10 GH  03/03/13-61 yoF never smoker, followed for allergy and asthma. FOLLOWS FOR: still on allergy vaccine 1:10 GH and doing well; no flare ups at this time-using Loratadine daily and helping. tolerating spring pollens Resolved acute illness in late March. Don't identify problems from lisinopril-little cough  ROS-see HPI Constitutional:   No-   weight loss, night sweats, fevers, chills, fatigue, lassitude. HEENT:   No-  headaches, difficulty swallowing, tooth/dental problems, sore throat,       No-  sneezing, itching, ear ache, nasal congestion, post nasal drip,  CV:  No-   chest pain, orthopnea, PND, swelling in lower extremities, anasarca,                                  dizziness, palpitations Resp: No-   shortness of breath with exertion or at rest.              No-   productive cough,  No non-productive cough,  No- coughing up of blood.              No-   change in color of mucus.  No- wheezing.   Skin: No-   rash or lesions. GI:  No-   heartburn, indigestion, abdominal pain, nausea, vomiting, GU:  MS:  No-   joint pain or swelling.  Neuro-     nothing unusual Psych:  No- change in mood or affect.   Objective:  OBJ- Physical Exam General- Alert, Oriented, Affect-appropriate/ cheerful, Distress- none acute Skin- rash-none, lesions- none, excoriation- none Lymphadenopathy- none Head- atraumatic            Eyes- Gross vision intact, PERRLA, conjunctivae and secretions clear            Ears- Hearing, canals-normal            Nose- +mucus bridging, no-Septal dev,polyps, erosion, perforation             Throat- Mallampati II , mucosa clear , drainage- none, tonsils- atrophic Neck- flexible , trachea midline, no stridor , thyroid nl, carotid no bruit Chest - symmetrical excursion , unlabored            Heart/CV- RRR , no murmur , no gallop  , no rub, nl s1 s2                           - JVD- none , edema- none, stasis changes- none, varices- none           Lung- clear to P&A, wheeze- none, cough- none , dullness-none, rub- none           Chest wall-  Abd-  Br/ Gen/ Rectal- Not done, not indicated Extrem- cyanosis- none, clubbing, none, atrophy- none, strength- nl Neuro- grossly intact to observation

## 2013-03-10 ENCOUNTER — Ambulatory Visit (INDEPENDENT_AMBULATORY_CARE_PROVIDER_SITE_OTHER): Payer: 59

## 2013-03-10 DIAGNOSIS — J309 Allergic rhinitis, unspecified: Secondary | ICD-10-CM

## 2013-03-12 NOTE — Assessment & Plan Note (Signed)
Good control currently. I don't identify cough or other side effects suggestive of lisinopril/ACE inhibitor

## 2013-03-12 NOTE — Assessment & Plan Note (Signed)
Satisfactory control. We agreed to continue allergy vaccine.

## 2013-03-17 ENCOUNTER — Ambulatory Visit: Payer: 59

## 2013-03-27 ENCOUNTER — Encounter: Payer: Self-pay | Admitting: Internal Medicine

## 2013-03-28 ENCOUNTER — Ambulatory Visit (INDEPENDENT_AMBULATORY_CARE_PROVIDER_SITE_OTHER): Payer: 59

## 2013-03-28 DIAGNOSIS — J309 Allergic rhinitis, unspecified: Secondary | ICD-10-CM

## 2013-04-04 ENCOUNTER — Ambulatory Visit: Payer: 59

## 2013-04-12 ENCOUNTER — Ambulatory Visit (INDEPENDENT_AMBULATORY_CARE_PROVIDER_SITE_OTHER): Payer: 59 | Admitting: Internal Medicine

## 2013-04-12 ENCOUNTER — Encounter: Payer: Self-pay | Admitting: Internal Medicine

## 2013-04-12 ENCOUNTER — Ambulatory Visit (INDEPENDENT_AMBULATORY_CARE_PROVIDER_SITE_OTHER): Payer: 59

## 2013-04-12 VITALS — BP 102/60 | HR 76 | Ht 60.63 in | Wt 190.2 lb

## 2013-04-12 DIAGNOSIS — J309 Allergic rhinitis, unspecified: Secondary | ICD-10-CM

## 2013-04-12 DIAGNOSIS — K589 Irritable bowel syndrome without diarrhea: Secondary | ICD-10-CM

## 2013-04-12 MED ORDER — HYOSCYAMINE SULFATE 0.125 MG SL SUBL: SUBLINGUAL_TABLET | SUBLINGUAL | Status: DC

## 2013-04-12 MED ORDER — HYDROCORTISONE ACETATE 25 MG RE SUPP: 25.0000 mg | Freq: Every day | RECTAL | Status: DC

## 2013-04-12 NOTE — Patient Instructions (Addendum)
Your medications have been sent to your pharmacy,Dr S.Fry

## 2013-04-12 NOTE — Progress Notes (Signed)
[name] [birth year] MRN [id number]        History of Present Illness:  This is a 62 year old, white female with irritable bowel syndrome with diarrhea and occasional rectal seepage. She has been much improved after using Anusol HC suppository and Levsin sublingually 0.125 mg when necessary. She also has been following high-fiber diet and using flax seeds daily. Her sprue profile and sedimentation rate were normal. Colonoscopy in August 2010 was normal. Recall colonoscopy August 2015. Both of her parents had colon cancer.   Past Medical History  Diagnosis Date  . Allergy   . GERD (gastroesophageal reflux disease)   . Hypertension   . Depression   . Gynecological examination     sees Dr. Huel Cote  . Asthma     sees Dr. Jetty Duhamel  . Diverticulosis   . Colon polyp 2007  . Hyperlipemia    Past Surgical History  Procedure Laterality Date  . Carpal tunnel release      right wrist   . Colonoscopy  06-27-09    per Dr. Juanda Chance, repeat in 5 yrs     reports that she has never smoked. She has never used smokeless tobacco. She reports that she uses illicit drugs. She reports that she does not drink alcohol. family history includes Colon cancer in her father and mother; Diabetes in her sister; Liver cancer in her father; and Throat cancer in her brother. Allergies  Allergen Reactions  . Doxycycline     REACTION: jittery        Review of Systems: Occasional crampy abdominal pain and diarrhea  The remainder of the 10 point ROS is negative except as outlined in H&P   Physical Exam: General appearance  Well developed, in no distress. Eyes- non icteric. HEENT nontraumatic, normocephalic. Mouth no lesions, tongue papillated, no cheilosis. Neck supple without adenopathy, thyroid not enlarged, no carotid bruits, no JVD. Lungs Clear to auscultation bilaterally. Cor normal S1, normal S2, regular rhythm, no murmur,  quiet precordium. Abdomen: Soft nontender abdomen  with normal active bowel sounds. Mildly obese. No ascites. No distention Rectal: Soft Hemoccult negative stool. Normal rectal sphincter tone Extremities no pedal edema. Skin no lesions. Neurological alert and oriented x 3. Psychological normal mood and affect.  Assessment and Plan:  62 year old white female improved since appointm with Amy Esterwood PA. Rectal seepage and diarrhea has been under control. She will continue the same regimen as needed.  Positive family history of colorectal cancer in both parents. She will be due for recall colonoscopy in August 2015   04/12/2013 [name]

## 2013-04-21 ENCOUNTER — Ambulatory Visit (INDEPENDENT_AMBULATORY_CARE_PROVIDER_SITE_OTHER): Payer: 59

## 2013-04-21 DIAGNOSIS — J309 Allergic rhinitis, unspecified: Secondary | ICD-10-CM

## 2013-04-26 DIAGNOSIS — Z0279 Encounter for issue of other medical certificate: Secondary | ICD-10-CM

## 2013-04-28 ENCOUNTER — Ambulatory Visit (INDEPENDENT_AMBULATORY_CARE_PROVIDER_SITE_OTHER): Payer: 59

## 2013-04-28 DIAGNOSIS — J309 Allergic rhinitis, unspecified: Secondary | ICD-10-CM

## 2013-05-05 ENCOUNTER — Ambulatory Visit (INDEPENDENT_AMBULATORY_CARE_PROVIDER_SITE_OTHER): Payer: 59

## 2013-05-05 DIAGNOSIS — J309 Allergic rhinitis, unspecified: Secondary | ICD-10-CM

## 2013-05-11 ENCOUNTER — Ambulatory Visit (INDEPENDENT_AMBULATORY_CARE_PROVIDER_SITE_OTHER): Payer: 59

## 2013-05-11 DIAGNOSIS — J309 Allergic rhinitis, unspecified: Secondary | ICD-10-CM

## 2013-05-16 ENCOUNTER — Encounter: Payer: Self-pay | Admitting: Family Medicine

## 2013-05-19 ENCOUNTER — Other Ambulatory Visit: Payer: Self-pay | Admitting: Internal Medicine

## 2013-05-19 ENCOUNTER — Ambulatory Visit (INDEPENDENT_AMBULATORY_CARE_PROVIDER_SITE_OTHER): Payer: 59

## 2013-05-19 DIAGNOSIS — J309 Allergic rhinitis, unspecified: Secondary | ICD-10-CM

## 2013-05-21 ENCOUNTER — Emergency Department (HOSPITAL_COMMUNITY)
Admission: EM | Admit: 2013-05-21 | Discharge: 2013-05-21 | Disposition: A | Discharge status: Home / Self Care | Payer: 59 | Source: Home / Self Care | Attending: Family Medicine | Admitting: Family Medicine

## 2013-05-21 ENCOUNTER — Encounter (HOSPITAL_COMMUNITY): Payer: Self-pay | Admitting: *Deleted

## 2013-05-21 DIAGNOSIS — S61219A Laceration without foreign body of unspecified finger without damage to nail, initial encounter: Secondary | ICD-10-CM

## 2013-05-21 DIAGNOSIS — S61209A Unspecified open wound of unspecified finger without damage to nail, initial encounter: Secondary | ICD-10-CM

## 2013-05-21 DIAGNOSIS — Z23 Encounter for immunization: Secondary | ICD-10-CM

## 2013-05-21 MED ORDER — TETANUS-DIPHTH-ACELL PERTUSSIS 5-2.5-18.5 LF-MCG/0.5 IM SUSP
INTRAMUSCULAR | Status: AC
  Filled 2013-05-21: qty 0.5

## 2013-05-21 MED ORDER — TETANUS-DIPHTH-ACELL PERTUSSIS 5-2.5-18.5 LF-MCG/0.5 IM SUSP
0.5000 mL | Freq: Once | INTRAMUSCULAR | Status: AC
  Administered 2013-05-21: 0.5 mL via INTRAMUSCULAR

## 2013-05-21 NOTE — ED Provider Notes (Addendum)
History    CSN: [id number] Arrival date & time 05/21/13  1339  None    Chief Complaint  Patient presents with  . Laceration   (Consider location/radiation/quality/duration/timing/severity/associated sxs/prior Treatment) Patient is a 62 y.o. female presenting with skin laceration. The history is provided by the patient.  Laceration Location:  Finger Finger laceration location:  L index finger Length (cm):  1.3 Depth:  Cutaneous Quality: straight   Bleeding: controlled   Time since incident:  1 hour Laceration mechanism:  Knife Tetanus status:  Out of date  Past Medical History  Diagnosis Date  . Allergy   . GERD (gastroesophageal reflux disease)   . Hypertension   . Depression   . Gynecological examination     sees Dr. Huel Cote  . Asthma     sees Dr. Jetty Duhamel  . Diverticulosis   . Colon polyp 2007  . Hyperlipemia    Past Surgical History  Procedure Laterality Date  . Carpal tunnel release      right wrist   . Colonoscopy  06-27-09    per Dr. Juanda Chance, repeat in 5 yrs    Family History  Problem Relation Age of Onset  . Colon cancer Mother   . Colon cancer Father   . Diabetes Sister   . Throat cancer Brother   . Liver cancer Father    History  Substance Use Topics  . Smoking status: Never Smoker   . Smokeless tobacco: Never Used  . Alcohol Use: No   OB History   Grav Para Term Preterm Abortions TAB SAB Ect Mult Living                 Review of Systems  Skin: Positive for wound.    Allergies  Doxycycline  Home Medications   Current Outpatient Rx  Name  Route  Sig  Dispense  Refill  . albuterol (PROAIR HFA) 108 (90 BASE) MCG/ACT inhaler   Inhalation   Inhale 2 puffs into the lungs every 6 (six) hours as needed for wheezing.         . Flaxseed, Linseed, (FLAX SEED OIL) 1000 MG CAPS   Oral   Take 1 capsule by mouth daily.           . hydrocortisone (ANUSOL-HC) 25 MG suppository   Rectal   Place 1 suppository (25 mg total)  rectally at bedtime.   12 suppository   3   . hyoscyamine (LEVSIN SL) 0.125 MG SL tablet      Take 1 tablet by mouth every morning and every 4 hours as needed for cramping.   30 tablet   3   . lisinopril-hydrochlorothiazide (PRINZIDE,ZESTORETIC) 20-12.5 MG per tablet      TAKE ONE TABLET BY MOUTH EVERY DAY   90 tablet   3   . loratadine (CLARITIN) 10 MG tablet   Oral   Take 10 mg by mouth as needed.          . NON FORMULARY      Allergy vaccine. Once a week          . omeprazole (PRILOSEC) 20 MG capsule   Oral   Take 1 capsule (20 mg total) by mouth daily.   90 capsule   3   . potassium chloride SA (K-DUR,KLOR-CON) 20 MEQ tablet   Oral   Take 1 tablet (20 mEq total) by mouth daily.   90 tablet   3   . pyridOXINE (VITAMIN B-6) 100 MG tablet  Oral   Take 100 mg by mouth daily.           . sertraline (ZOLOFT) 100 MG tablet   Oral   Take 1 tablet (100 mg total) by mouth daily.   90 tablet   3   . SYMBICORT 80-4.5 MCG/ACT inhaler      INHALE 2 PUFFS INTO THE LUNGS TWICE A DAY   1 Inhaler   4   . aspirin 81 MG tablet   Oral   Take 81 mg by mouth daily.           Marland Kitchen EXPIRED: fluticasone (FLONASE) 50 MCG/ACT nasal spray   Nasal   Place 2 sprays into the nose daily. At bedtime   16 g   prn    BP 129/44  Pulse 65  Temp(Src) 98.1 F (36.7 C) (Oral)  Resp 16  SpO2 100% Physical Exam  Nursing note and vitals reviewed. Constitutional: She is oriented to person, place, and time. She appears well-developed and well-nourished.  Musculoskeletal:  1.3cm lac to distal aspect of lif.  Neurological: She is alert and oriented to person, place, and time.  Skin: Skin is warm and dry.    ED Course  LACERATION REPAIR Date/Time: 05/21/2013 2:50 PM Performed by: Linna Hoff Authorized by: Bradd Canary D Consent: Verbal consent obtained. Risks and benefits: risks, benefits and alternatives were discussed Consent given by: patient Body area: upper  extremity Location details: left index finger Laceration length: 1.3 cm Tendon involvement: none Nerve involvement: none Vascular damage: no Patient sedated: no Preparation: Patient was prepped and draped in the usual sterile fashion. Amount of cleaning: standard Debridement: none Degree of undermining: none Skin closure: glue Technique: simple Approximation: close Approximation difficulty: simple Patient tolerance: Patient tolerated the procedure well with no immediate complications.   (including critical care time) Labs Reviewed - No data to display No results found. 1. Finger laceration, initial encounter     MDM    Linna Hoff, MD 05/21/13 1458  Linna Hoff, MD 05/21/13 1500  Linna Hoff, MD 05/22/13 1355

## 2013-05-21 NOTE — ED Notes (Signed)
Left index finger soaking in chlorhexidine/Betadine/NS solution.

## 2013-05-21 NOTE — ED Notes (Signed)
Reports 2 lacerations to left distal aspect index finger while cutting corn.  Bleeding controlled.

## 2013-05-26 ENCOUNTER — Ambulatory Visit (INDEPENDENT_AMBULATORY_CARE_PROVIDER_SITE_OTHER): Payer: 59

## 2013-05-26 DIAGNOSIS — J309 Allergic rhinitis, unspecified: Secondary | ICD-10-CM

## 2013-06-02 ENCOUNTER — Ambulatory Visit (INDEPENDENT_AMBULATORY_CARE_PROVIDER_SITE_OTHER): Payer: 59

## 2013-06-02 DIAGNOSIS — J309 Allergic rhinitis, unspecified: Secondary | ICD-10-CM

## 2013-06-09 ENCOUNTER — Ambulatory Visit (INDEPENDENT_AMBULATORY_CARE_PROVIDER_SITE_OTHER): Payer: 59

## 2013-06-09 DIAGNOSIS — J309 Allergic rhinitis, unspecified: Secondary | ICD-10-CM

## 2013-06-16 ENCOUNTER — Ambulatory Visit (INDEPENDENT_AMBULATORY_CARE_PROVIDER_SITE_OTHER): Payer: 59

## 2013-06-16 DIAGNOSIS — J309 Allergic rhinitis, unspecified: Secondary | ICD-10-CM

## 2013-06-23 ENCOUNTER — Ambulatory Visit (INDEPENDENT_AMBULATORY_CARE_PROVIDER_SITE_OTHER): Payer: 59

## 2013-06-23 DIAGNOSIS — J309 Allergic rhinitis, unspecified: Secondary | ICD-10-CM

## 2013-06-30 ENCOUNTER — Ambulatory Visit: Payer: 59

## 2013-07-07 ENCOUNTER — Ambulatory Visit (INDEPENDENT_AMBULATORY_CARE_PROVIDER_SITE_OTHER): Payer: 59

## 2013-07-07 DIAGNOSIS — J309 Allergic rhinitis, unspecified: Secondary | ICD-10-CM

## 2013-07-14 ENCOUNTER — Ambulatory Visit (INDEPENDENT_AMBULATORY_CARE_PROVIDER_SITE_OTHER): Payer: 59

## 2013-07-14 DIAGNOSIS — J309 Allergic rhinitis, unspecified: Secondary | ICD-10-CM

## 2013-07-21 ENCOUNTER — Ambulatory Visit (INDEPENDENT_AMBULATORY_CARE_PROVIDER_SITE_OTHER): Payer: 59

## 2013-07-21 DIAGNOSIS — J309 Allergic rhinitis, unspecified: Secondary | ICD-10-CM

## 2013-07-28 ENCOUNTER — Ambulatory Visit (INDEPENDENT_AMBULATORY_CARE_PROVIDER_SITE_OTHER): Payer: 59

## 2013-07-28 DIAGNOSIS — J309 Allergic rhinitis, unspecified: Secondary | ICD-10-CM

## 2013-08-04 ENCOUNTER — Ambulatory Visit (INDEPENDENT_AMBULATORY_CARE_PROVIDER_SITE_OTHER): Payer: 59

## 2013-08-04 ENCOUNTER — Encounter: Payer: Self-pay | Admitting: Family Medicine

## 2013-08-04 ENCOUNTER — Ambulatory Visit (INDEPENDENT_AMBULATORY_CARE_PROVIDER_SITE_OTHER): Payer: 59 | Admitting: Family Medicine

## 2013-08-04 VITALS — BP 128/74 | HR 83 | Temp 98.4°F | Wt 191.0 lb

## 2013-08-04 DIAGNOSIS — L02419 Cutaneous abscess of limb, unspecified: Secondary | ICD-10-CM

## 2013-08-04 DIAGNOSIS — J309 Allergic rhinitis, unspecified: Secondary | ICD-10-CM

## 2013-08-04 MED ORDER — SULFAMETHOXAZOLE-TMP DS 800-160 MG PO TABS: 1.0000 | ORAL_TABLET | Freq: Two times a day (BID) | ORAL | Status: DC

## 2013-08-04 NOTE — Progress Notes (Signed)
  Subjective:    Patient ID: [name], female    DOB: 25-Oct-1951, 62 y.o.   MRN: [id number]  HPI Here for 4 days of a boil on the left lower leg which opens and drains at times. No fever. Several members of her household have recurrent MRSA infections.    Review of Systems  Constitutional: Negative.   Skin: Positive for wound.       Objective:   Physical Exam  Constitutional: She appears well-developed and well-nourished.  Skin:  The left lower leg has a large pustule about 1 cm in diameter with whitish fluid draining from it          Assessment & Plan:  This is probably MRSA. A culture was obtained. Treat with Bactrim DS.

## 2013-08-07 LAB — WOUND CULTURE: Gram Stain: NONE SEEN

## 2013-08-07 NOTE — Progress Notes (Signed)
Quick Note:  I spoke with pt ______ 

## 2013-08-11 ENCOUNTER — Ambulatory Visit: Payer: 59

## 2013-08-16 ENCOUNTER — Ambulatory Visit (INDEPENDENT_AMBULATORY_CARE_PROVIDER_SITE_OTHER): Payer: 59

## 2013-08-16 DIAGNOSIS — J309 Allergic rhinitis, unspecified: Secondary | ICD-10-CM

## 2013-08-25 ENCOUNTER — Ambulatory Visit (INDEPENDENT_AMBULATORY_CARE_PROVIDER_SITE_OTHER): Payer: 59

## 2013-08-25 DIAGNOSIS — J309 Allergic rhinitis, unspecified: Secondary | ICD-10-CM

## 2013-09-01 ENCOUNTER — Ambulatory Visit: Payer: 59

## 2013-09-08 ENCOUNTER — Ambulatory Visit (INDEPENDENT_AMBULATORY_CARE_PROVIDER_SITE_OTHER): Payer: 59 | Admitting: Internal Medicine

## 2013-09-08 ENCOUNTER — Ambulatory Visit (INDEPENDENT_AMBULATORY_CARE_PROVIDER_SITE_OTHER): Payer: 59

## 2013-09-08 ENCOUNTER — Encounter: Payer: Self-pay | Admitting: Internal Medicine

## 2013-09-08 ENCOUNTER — Ambulatory Visit (INDEPENDENT_AMBULATORY_CARE_PROVIDER_SITE_OTHER)
Admission: RE | Admit: 2013-09-08 | Discharge: 2013-09-08 | Disposition: A | Discharge status: Home / Self Care | Payer: 59 | Source: Ambulatory Visit | Attending: Internal Medicine | Admitting: Internal Medicine

## 2013-09-08 VITALS — BP 140/80 | HR 88 | Ht 61.0 in | Wt 192.0 lb

## 2013-09-08 DIAGNOSIS — R0609 Other forms of dyspnea: Secondary | ICD-10-CM

## 2013-09-08 DIAGNOSIS — R059 Cough, unspecified: Secondary | ICD-10-CM

## 2013-09-08 DIAGNOSIS — J4521 Mild intermittent asthma with (acute) exacerbation: Secondary | ICD-10-CM

## 2013-09-08 DIAGNOSIS — R05 Cough: Secondary | ICD-10-CM

## 2013-09-08 DIAGNOSIS — J309 Allergic rhinitis, unspecified: Secondary | ICD-10-CM

## 2013-09-08 DIAGNOSIS — J45901 Unspecified asthma with (acute) exacerbation: Secondary | ICD-10-CM

## 2013-09-08 DIAGNOSIS — J301 Allergic rhinitis due to pollen: Secondary | ICD-10-CM

## 2013-09-08 MED ORDER — FLUTICASONE PROPIONATE 50 MCG/ACT NA SUSP: 2.0000 | Freq: Every day | NASAL | Status: DC

## 2013-09-08 NOTE — Patient Instructions (Addendum)
Your dry cough may be from lisinopril. If the cough gets to bothering you more, there are alternatives that can be tried.  Try to get more exercise- especially walking longer, more often and a little faster. This can help you build stamina.   Script refill Flonase/ fluticasone  Sent   Order CXR- dx dyspnea with exertion, cough  We can continue allergy vaccine 1:10 GH

## 2013-09-08 NOTE — Progress Notes (Signed)
Subjective:    Patient ID: [name], female    DOB: May 18, 1951, 62 y.o.   MRN: [id number]  HPI 74 yoF never smoker, followed for allergy and asthma. Last here September 08, 2010 having had recent bronchitis. Mild winter with no major problems. In the last 3 weeks has blamed pollen for waking with head congestion. Blowing nose, ears stopped up.Chest ok and denies infection. Once upright in the morning and after claritin she is ok the rest of the day. Saline nasal spray helps. Decongestant sprays cause nose bleed.  Chest has done very well with Symbicort. At least 3 weeks since last used Proair rescue inhaler. Stress at work sometimes makes her tight.  09/03/11-  60 yoF never smoker, followed for allergy and asthma. For the last 2 days has had maxillary pressure and upper chest congestion. People at work have been sneezing and coughing. She has not checked her temperature but feels a little flushed. Nothing purulent or painful. Continues Symbicort and uses rescue inhaler about twice a week. No problems with allergy vaccine. Has had flu vaccine. Continues as stressed as she works on her divorce.  03/03/12- 60 yoF never smoker, followed for allergy and asthma. Went to urgent care for sinusitis 3 or 4 weeks ago. Improved but wants prescription for Flonase. Continues allergy vaccine. Asthma under good control with no wheezing and rare need for rescue inhaler. Persistent mild hacking cough. We discussed this because of her lisinopril and left it for her to decide whether it was a concern to discuss with her primary physician.  09/02/12- 60 yoF never smoker, followed for allergy and asthma. Still on allergy vaccine 1:10 GH and doing well. Denies any flare ups at this time. Had flu vaccine. Minor cough on lisinopril. She considers it is significant. Not much rhinitis this fall. CXR 03/03/12 IMPRESSION:  1. No radiographic evidence of acute cardiopulmonary disease.  Original Report Authenticated By:  Florencia Reasons, M.D.   01/30/13- 40 yoF never smoker, followed for allergy and asthma. ACUTE VISIT:chest congestion; wheezing, asthma attack last night.  Still on allergy vaccine 1:10 GH  03/03/13-61 yoF never smoker, followed for allergy and asthma. FOLLOWS FOR: still on allergy vaccine 1:10 GH and doing well; no flare ups at this time-using Loratadine daily and helping. tolerating spring pollens Resolved acute illness in late March. Don't identify problems from lisinopril-little cough  09/08/13- 62 yoF never smoker, followed for allergy and asthma. FOLLOWS FOR:  still on Allergy vaccine 1:10 GH and doing well She is noticing easy dyspnea on exertion walking up long chills, without chest pain. This may not be a real change. Occasional cough doesn't bother her. Mild dry throat tickle (lisinopril). Out of Flonase. PFT in 2009: Within normal limits-FEV1/FVC 0.83 with FEV1 2.64/130%.  ROS-see HPI Constitutional:   No-   weight loss, night sweats, fevers, chills, fatigue, lassitude. HEENT:   No-  headaches, difficulty swallowing, tooth/dental problems, sore throat,       No-  sneezing, itching, ear ache, nasal congestion, post nasal drip,  CV:  No-   chest pain, orthopnea, PND, swelling in lower extremities, anasarca, dizziness, palpitations Resp: +shortness of breath with exertion or at rest.              No-   productive cough,  No non-productive cough,  No- coughing up of blood.              No-   change in color of mucus.  No- wheezing.  Skin: No-   rash or lesions. GI:  No-   heartburn, indigestion, abdominal pain, nausea, vomiting, GU:  MS:  No-   joint pain or swelling.   Neuro-     nothing unusual Psych:  No- change in mood or affect.   Objective:  OBJ- Physical Exam General- Alert, Oriented, Affect-appropriate/ cheerful, Distress- none acute. obese Skin- rash-none, lesions- none, excoriation- none Lymphadenopathy- none Head- atraumatic            Eyes- Gross vision intact,  PERRLA, conjunctivae and secretions clear            Ears- Hearing, canals-normal            Nose- +mucus bridging, no-Septal dev,polyps, erosion, perforation             Throat- Mallampati II , mucosa clear , drainage- none, tonsils- atrophic Neck- flexible , trachea midline, no stridor , thyroid nl, carotid no bruit Chest - symmetrical excursion , unlabored           Heart/CV- RRR , no murmur , no gallop  , no rub, nl s1 s2                           - JVD- none , edema- none, stasis changes- none, varices- none           Lung- clear to P&A, wheeze- none, cough+dry , dullness-none, rub- none           Chest wall-  Abd-  Br/ Gen/ Rectal- Not done, not indicated Extrem- cyanosis- none, clubbing, none, atrophy- none, strength- nl Neuro- grossly intact to observation

## 2013-09-11 NOTE — Progress Notes (Signed)
Quick Note:  Advised pt of cxr results per CY. Pt verbalized understanding and has no further questions ______ 

## 2013-09-15 ENCOUNTER — Ambulatory Visit (INDEPENDENT_AMBULATORY_CARE_PROVIDER_SITE_OTHER): Payer: 59

## 2013-09-15 DIAGNOSIS — J309 Allergic rhinitis, unspecified: Secondary | ICD-10-CM

## 2013-09-24 ENCOUNTER — Encounter: Payer: Self-pay | Admitting: Internal Medicine

## 2013-09-24 NOTE — Assessment & Plan Note (Signed)
Continues allergy vaccine 1:10 GH Plan-refill Flonase

## 2013-09-24 NOTE — Assessment & Plan Note (Signed)
Nonspecific dyspnea on exertion may simply be deconditioning and obesity. Her dry cough may be from lisinopril which we discussed. She doesn't care enough to change. Plan-encouraged more exercise. Chest x-ray

## 2013-09-29 ENCOUNTER — Ambulatory Visit (INDEPENDENT_AMBULATORY_CARE_PROVIDER_SITE_OTHER): Payer: 59

## 2013-09-29 DIAGNOSIS — J309 Allergic rhinitis, unspecified: Secondary | ICD-10-CM

## 2013-10-04 ENCOUNTER — Ambulatory Visit (INDEPENDENT_AMBULATORY_CARE_PROVIDER_SITE_OTHER): Payer: 59

## 2013-10-04 DIAGNOSIS — J309 Allergic rhinitis, unspecified: Secondary | ICD-10-CM

## 2013-10-06 ENCOUNTER — Ambulatory Visit: Payer: 59

## 2013-10-13 ENCOUNTER — Ambulatory Visit: Payer: 59

## 2013-10-25 ENCOUNTER — Ambulatory Visit (INDEPENDENT_AMBULATORY_CARE_PROVIDER_SITE_OTHER): Payer: 59

## 2013-10-25 DIAGNOSIS — J309 Allergic rhinitis, unspecified: Secondary | ICD-10-CM

## 2013-10-31 ENCOUNTER — Ambulatory Visit (INDEPENDENT_AMBULATORY_CARE_PROVIDER_SITE_OTHER): Payer: 59

## 2013-10-31 DIAGNOSIS — J309 Allergic rhinitis, unspecified: Secondary | ICD-10-CM

## 2013-11-03 ENCOUNTER — Ambulatory Visit: Payer: 59

## 2013-11-07 ENCOUNTER — Telehealth: Payer: Self-pay | Admitting: Internal Medicine

## 2013-11-07 MED ORDER — OSELTAMIVIR PHOSPHATE 75 MG PO CAPS: 75.0000 mg | ORAL_CAPSULE | Freq: Two times a day (BID) | ORAL | Status: DC

## 2013-11-07 NOTE — Telephone Encounter (Signed)
Offer Tamiflu 75 mg # 10, 1 twice daily    Can also take Mucinex-DM

## 2013-11-07 NOTE — Telephone Encounter (Signed)
Pt c/o increased chest congestion, cough and sinus congestion x 2 days. Denies mucus production--unable to expel Taking Mucinex OTC and Robitussin Cold Tabs OTC.  Allergies  Allergen Reactions  . Doxycycline     REACTION: jittery                                                             Medication List   aspirin 81 MG tablet  Take 81 mg by mouth daily.     Flax Seed Oil 1000 MG Caps  Take 1 capsule by mouth daily.     fluticasone 50 MCG/ACT nasal spray  Commonly known as:  FLONASE  Place 2 sprays into the nose daily. At bedtime     hydrocortisone 25 MG suppository  Commonly known as:  ANUSOL-HC  Place 1 suppository (25 mg total) rectally at bedtime.     hyoscyamine 0.125 MG SL tablet  Commonly known as:  LEVSIN SL  Take 1 tablet by mouth every morning and every 4 hours as needed for cramping.     lisinopril-hydrochlorothiazide 20-12.5 MG per tablet  Commonly known as:  PRINZIDE,ZESTORETIC  TAKE ONE TABLET BY MOUTH EVERY DAY     loratadine 10 MG tablet  Commonly known as:  CLARITIN  Take 10 mg by mouth as needed.     NON FORMULARY  Allergy vaccine. Once a week     omeprazole 20 MG capsule  Commonly known as:  PRILOSEC  Take 1 capsule (20 mg total) by mouth daily.     potassium chloride SA 20 MEQ tablet  Commonly known as:  K-DUR,KLOR-CON  Take 1 tablet (20 mEq total) by mouth daily.     PROAIR HFA 108 (90 BASE) MCG/ACT inhaler  Generic drug:  albuterol  Inhale 2 puffs into the lungs every 6 (six) hours as needed for wheezing.     pyridOXINE 100 MG tablet  Commonly known as:  VITAMIN B-6  Take 100 mg by mouth daily.     sertraline 100 MG tablet  Commonly known as:  ZOLOFT  Take 1 tablet (100 mg total) by mouth daily.     SYMBICORT 80-4.5 MCG/ACT inhaler  Generic drug:  budesonide-formoterol  INHALE 2 PUFFS INTO THE LUNGS TWICE A DAY       [facility] [place]  Dr Maple Hudson please advise. Thanks.

## 2013-11-07 NOTE — Telephone Encounter (Signed)
Returning call can be reached at [phone].[name]

## 2013-11-07 NOTE — Telephone Encounter (Signed)
Pt aware of recs. rx sent. Nothing further needed 

## 2013-11-07 NOTE — Telephone Encounter (Signed)
lmomtcb x1 

## 2013-11-10 ENCOUNTER — Telehealth: Payer: Self-pay | Admitting: Internal Medicine

## 2013-11-10 MED ORDER — PREDNISONE 10 MG PO TABS: ORAL_TABLET | ORAL | Status: DC

## 2013-11-10 NOTE — Telephone Encounter (Signed)
[phone] returning call

## 2013-11-10 NOTE — Telephone Encounter (Signed)
Ok to Rx prednisone 10 mg, # 20, 4 X 2 DAYS, 3 X 2 DAYS, 2 X 2 DAYS, 1 X 2 DAYS

## 2013-11-10 NOTE — Telephone Encounter (Signed)
lmomtcb to make pt aware of CY recs.

## 2013-11-10 NOTE — Telephone Encounter (Signed)
Pred rx sent to [facility] lmom[facility] on pt's work # as provided. I atc pt's home # - no option to leave msg.

## 2013-11-10 NOTE — Telephone Encounter (Signed)
Called and spoke with pt and she stated that she has been taking the tamiflu that was given on 11/07/13.  She has also been using the robitussin dm and the mucinex.  She stated that she is still very congested and not able to get anything up.  Pt is requesting to do a breathing tx or have a steroid call into her pharmacy.  CY please advise. Thanks  Last ov--09/08/2013 Next ov--03/08/2014  Allergies  Allergen Reactions  . Doxycycline     REACTION: jittery    Current Outpatient Prescriptions on File Prior to Visit  Medication Sig Dispense Refill  . albuterol (PROAIR HFA) 108 (90 BASE) MCG/ACT inhaler Inhale 2 puffs into the lungs every 6 (six) hours as needed for wheezing.      Marland Kitchen aspirin 81 MG tablet Take 81 mg by mouth daily.        . Flaxseed, Linseed, (FLAX SEED OIL) 1000 MG CAPS Take 1 capsule by mouth daily.        . fluticasone (FLONASE) 50 MCG/ACT nasal spray Place 2 sprays into the nose daily. At bedtime  48 g  3  . hydrocortisone (ANUSOL-HC) 25 MG suppository Place 1 suppository (25 mg total) rectally at bedtime.  12 suppository  3  . hyoscyamine (LEVSIN SL) 0.125 MG SL tablet Take 1 tablet by mouth every morning and every 4 hours as needed for cramping.  30 tablet  3  . lisinopril-hydrochlorothiazide (PRINZIDE,ZESTORETIC) 20-12.5 MG per tablet TAKE ONE TABLET BY MOUTH EVERY DAY  90 tablet  3  . loratadine (CLARITIN) 10 MG tablet Take 10 mg by mouth as needed.       . NON FORMULARY Allergy vaccine. Once a week       . omeprazole (PRILOSEC) 20 MG capsule Take 1 capsule (20 mg total) by mouth daily.  90 capsule  3  . oseltamivir (TAMIFLU) 75 MG capsule Take 1 capsule (75 mg total) by mouth 2 (two) times daily.  10 capsule  0  . potassium chloride SA (K-DUR,KLOR-CON) 20 MEQ tablet Take 1 tablet (20 mEq total) by mouth daily.  90 tablet  3  . pyridOXINE (VITAMIN B-6) 100 MG tablet Take 100 mg by mouth daily.        . sertraline (ZOLOFT) 100 MG tablet Take 1 tablet (100 mg total) by mouth  daily.  90 tablet  3  . SYMBICORT 80-4.5 MCG/ACT inhaler INHALE 2 PUFFS INTO THE LUNGS TWICE A DAY  1 Inhaler  4   No current facility-administered medications on file prior to visit.

## 2013-11-10 NOTE — Telephone Encounter (Signed)
lmomtcb for pt 

## 2013-11-17 ENCOUNTER — Ambulatory Visit (INDEPENDENT_AMBULATORY_CARE_PROVIDER_SITE_OTHER): Payer: BC Managed Care – PPO

## 2013-11-17 DIAGNOSIS — J309 Allergic rhinitis, unspecified: Secondary | ICD-10-CM

## 2013-11-24 ENCOUNTER — Ambulatory Visit (INDEPENDENT_AMBULATORY_CARE_PROVIDER_SITE_OTHER): Payer: 59

## 2013-11-24 DIAGNOSIS — J309 Allergic rhinitis, unspecified: Secondary | ICD-10-CM

## 2013-11-27 ENCOUNTER — Ambulatory Visit (INDEPENDENT_AMBULATORY_CARE_PROVIDER_SITE_OTHER): Payer: 59

## 2013-11-27 DIAGNOSIS — J309 Allergic rhinitis, unspecified: Secondary | ICD-10-CM

## 2013-12-01 ENCOUNTER — Ambulatory Visit: Payer: 59

## 2013-12-08 ENCOUNTER — Ambulatory Visit (INDEPENDENT_AMBULATORY_CARE_PROVIDER_SITE_OTHER): Payer: BC Managed Care – PPO

## 2013-12-08 DIAGNOSIS — J309 Allergic rhinitis, unspecified: Secondary | ICD-10-CM

## 2013-12-13 ENCOUNTER — Encounter: Payer: Self-pay | Admitting: Internal Medicine

## 2013-12-22 ENCOUNTER — Ambulatory Visit (INDEPENDENT_AMBULATORY_CARE_PROVIDER_SITE_OTHER): Payer: BC Managed Care – PPO

## 2013-12-22 DIAGNOSIS — J309 Allergic rhinitis, unspecified: Secondary | ICD-10-CM

## 2013-12-29 ENCOUNTER — Ambulatory Visit: Payer: BC Managed Care – PPO

## 2014-01-05 ENCOUNTER — Ambulatory Visit (INDEPENDENT_AMBULATORY_CARE_PROVIDER_SITE_OTHER): Payer: BC Managed Care – PPO

## 2014-01-05 DIAGNOSIS — J309 Allergic rhinitis, unspecified: Secondary | ICD-10-CM

## 2014-01-19 ENCOUNTER — Encounter: Payer: Self-pay | Admitting: Family Medicine

## 2014-01-19 ENCOUNTER — Ambulatory Visit (INDEPENDENT_AMBULATORY_CARE_PROVIDER_SITE_OTHER): Payer: BC Managed Care – PPO

## 2014-01-19 ENCOUNTER — Ambulatory Visit (INDEPENDENT_AMBULATORY_CARE_PROVIDER_SITE_OTHER): Payer: BC Managed Care – PPO | Admitting: Family Medicine

## 2014-01-19 VITALS — BP 120/60 | HR 82 | Temp 98.9°F | Ht 61.0 in | Wt 188.0 lb

## 2014-01-19 DIAGNOSIS — L0293 Carbuncle, unspecified: Secondary | ICD-10-CM

## 2014-01-19 DIAGNOSIS — L0292 Furuncle, unspecified: Secondary | ICD-10-CM

## 2014-01-19 DIAGNOSIS — K116 Mucocele of salivary gland: Secondary | ICD-10-CM

## 2014-01-19 DIAGNOSIS — J309 Allergic rhinitis, unspecified: Secondary | ICD-10-CM

## 2014-01-19 MED ORDER — SULFAMETHOXAZOLE-TMP DS 800-160 MG PO TABS: 1.0000 | ORAL_TABLET | Freq: Two times a day (BID) | ORAL | Status: DC

## 2014-01-19 NOTE — Progress Notes (Signed)
   Subjective:    Patient ID: [name], female    DOB: Sep 20, 1951, 63 y.o.   MRN: [id number]  HPI Here for 2 things. First about 12 days ago she developed a tender lump in the left groin which has slowly enlarged since then. Also for the past 2 days she has had a lump on the inner left cheek that is tender and she repeatedly bites between her teeth.    Review of Systems  Constitutional: Negative.        Objective:   Physical Exam  Constitutional: She appears well-developed and well-nourished.  HENT:  Tender round papular lesion on the inner left cheek   Lymphadenopathy:    She has no cervical adenopathy.  Skin:  Tender lump on the left labia majora           Assessment & Plan:  Refer to ENT for what appears to be a mucus retention cyst. Treat the boil with Septra DS.

## 2014-01-19 NOTE — Progress Notes (Signed)
Pre visit review using our clinic review tool, if applicable. No additional management support is needed unless otherwise documented below in the visit note. 

## 2014-01-26 ENCOUNTER — Ambulatory Visit (INDEPENDENT_AMBULATORY_CARE_PROVIDER_SITE_OTHER): Payer: BC Managed Care – PPO

## 2014-01-26 ENCOUNTER — Telehealth: Payer: Self-pay | Admitting: Internal Medicine

## 2014-01-26 ENCOUNTER — Encounter: Payer: Self-pay | Admitting: Internal Medicine

## 2014-01-26 ENCOUNTER — Ambulatory Visit (INDEPENDENT_AMBULATORY_CARE_PROVIDER_SITE_OTHER): Payer: BC Managed Care – PPO | Admitting: Internal Medicine

## 2014-01-26 VITALS — BP 136/72 | HR 97 | Ht 61.0 in | Wt 189.6 lb

## 2014-01-26 DIAGNOSIS — J209 Acute bronchitis, unspecified: Secondary | ICD-10-CM

## 2014-01-26 DIAGNOSIS — J309 Allergic rhinitis, unspecified: Secondary | ICD-10-CM

## 2014-01-26 DIAGNOSIS — J4520 Mild intermittent asthma, uncomplicated: Secondary | ICD-10-CM

## 2014-01-26 DIAGNOSIS — J45909 Unspecified asthma, uncomplicated: Secondary | ICD-10-CM

## 2014-01-26 MED ORDER — LEVALBUTEROL HCL 0.63 MG/3ML IN NEBU
0.6300 mg | INHALATION_SOLUTION | Freq: Once | RESPIRATORY_TRACT | Status: AC
  Administered 2014-01-26: 0.63 mg via RESPIRATORY_TRACT

## 2014-01-26 MED ORDER — METHYLPREDNISOLONE ACETATE 80 MG/ML IJ SUSP
80.0000 mg | Freq: Once | INTRAMUSCULAR | Status: AC
  Administered 2014-01-26: 80 mg via INTRAMUSCULAR

## 2014-01-26 NOTE — Assessment & Plan Note (Signed)
Acute bronchitis with asthma Plan- neb xop, depomedrol, continue usual meds

## 2014-01-26 NOTE — Progress Notes (Signed)
Subjective:    Patient ID: [name], female    DOB: 09-28-1951, 63 y.o.   MRN: [id number]  HPI 35 yoF never smoker, followed for allergy and asthma. Last here September 08, 2010 having had recent bronchitis. Mild winter with no major problems. In the last 3 weeks has blamed pollen for waking with head congestion. Blowing nose, ears stopped up.Chest ok and denies infection. Once upright in the morning and after claritin she is ok the rest of the day. Saline nasal spray helps. Decongestant sprays cause nose bleed.  Chest has done very well with Symbicort. At least 3 weeks since last used Proair rescue inhaler. Stress at work sometimes makes her tight.  09/03/11-  60 yoF never smoker, followed for allergy and asthma. For the last 2 days has had maxillary pressure and upper chest congestion. People at work have been sneezing and coughing. She has not checked her temperature but feels a little flushed. Nothing purulent or painful. Continues Symbicort and uses rescue inhaler about twice a week. No problems with allergy vaccine. Has had flu vaccine. Continues as stressed as she works on her divorce.  03/03/12- 53 yoF never smoker, followed for allergy and asthma. Went to urgent care for sinusitis 3 or 4 weeks ago. Improved but wants prescription for Flonase. Continues allergy vaccine. Asthma under good control with no wheezing and rare need for rescue inhaler. Persistent mild hacking cough. We discussed this because of her lisinopril and left it for her to decide whether it was a concern to discuss with her primary physician.  09/02/12- 60 yoF never smoker, followed for allergy and asthma. Still on allergy vaccine 1:10 GH and doing well. Denies any flare ups at this time. Had flu vaccine. Minor cough on lisinopril. She considers it is significant. Not much rhinitis this fall. CXR 03/03/12 IMPRESSION:  1. No radiographic evidence of acute cardiopulmonary disease.  Original Report Authenticated By:  Etheleen Mayhew, M.D.   01/30/13- 64 yoF never smoker, followed for allergy and asthma. ACUTE VISIT:chest congestion; wheezing, asthma attack last night.  Still on allergy vaccine 1:10 GH  03/03/13-61 yoF never smoker, followed for allergy and asthma. FOLLOWS FOR: still on allergy vaccine 1:10 GH and doing well; no flare ups at this time-using Loratadine daily and helping. tolerating spring pollens Resolved acute illness in late March. Don't identify problems from lisinopril-little cough  09/08/13- 83 yoF never smoker, followed for allergy and asthma. FOLLOWS FOR:  still on Allergy vaccine 1:10 GH and doing well She is noticing easy dyspnea on exertion walking up long chills, without chest pain. This may not be a real change. Occasional cough doesn't bother her. Mild dry throat tickle (lisinopril). Out of Flonase. PFT in 2009: Within normal limits-FEV1/FVC 0.83 with FEV1 2.64/130%.  01/26/14- 63 yoF never smoker, followed for allergy and asthma.  allergy vaccine 1:10 GH FOLLOWS GYI:RSWNIOEVO on Sulfa for boil on body-requesting breathing treatment. Woke this AM with progressive cough, wheeze. No sore throat, fever. Started loratadine 3 weeks ago. Continues allergy vaccine 1:10 [place] L inguinal boil almost resolved.   ROS-see HPI Constitutional:   No-   weight loss, night sweats, fevers, chills, fatigue, lassitude. HEENT:   No-  headaches, difficulty swallowing, tooth/dental problems, sore throat,       No-  sneezing, itching, ear ache, nasal congestion, post nasal drip,  CV:  No-   chest pain, orthopnea, PND, swelling in lower extremities, anasarca, dizziness, palpitations Resp: +shortness of breath with exertion or at rest.  No-   productive cough,  + non-productive cough,  No- coughing up of blood.              No-   change in color of mucus.  + wheezing.   Skin: No-   rash or lesions. GI:  No-   heartburn, indigestion, abdominal pain, nausea, vomiting, GU:  MS:  No-    joint pain or swelling.   Neuro-     nothing unusual Psych:  No- change in mood or affect.   Objective:  OBJ- Physical Exam General- Alert, Oriented, Affect-appropriate/ cheerful, Distress- none acute. obese Skin- rash-none, lesions- none, excoriation- none Lymphadenopathy- none Head- atraumatic            Eyes- Gross vision intact, PERRLA, conjunctivae and secretions clear            Ears- Hearing, canals-normal            Nose- +mucus bridging, no-Septal dev,polyps, erosion, perforation             Throat- Mallampati II-III , mucosa clear , drainage- none, tonsils- atrophic, +dentures Neck- flexible , trachea midline, no stridor , thyroid nl, carotid no bruit Chest - symmetrical excursion , unlabored           Heart/CV- RRR , no murmur , no gallop  , no rub, nl s1 s2                           - JVD- none , edema- none, stasis changes- none, varices- none           Lung- clear to P&A, , cough+raspy/ wheeze , dullness-none, rub- none           Chest wall-  Abd-  Br/ Gen/ Rectal- Not done, not indicated Extrem- cyanosis- none, clubbing, none, atrophy- none, strength- nl Neuro- grossly intact to observation

## 2014-01-26 NOTE — Telephone Encounter (Signed)
Called spoke with pt. She c/o increase SOB, can;t get a full breathe in, wheezing, chest tx, dry cough x this AM. She is wanting to come in for neb tx. Please advise Dr. Annamaria Boots thanks  Allergies  Allergen Reactions  . Doxycycline     REACTION: jittery     Current Outpatient Prescriptions on File Prior to Visit  Medication Sig Dispense Refill  . albuterol (PROAIR HFA) 108 (90 BASE) MCG/ACT inhaler Inhale 2 puffs into the lungs every 6 (six) hours as needed for wheezing.      Marland Kitchen aspirin 81 MG tablet Take 81 mg by mouth daily.        . Flaxseed, Linseed, (FLAX SEED OIL) 1000 MG CAPS Take 1 capsule by mouth daily.        . fluticasone (FLONASE) 50 MCG/ACT nasal spray Place 2 sprays into the nose daily. At bedtime  48 g  3  . hydrocortisone (ANUSOL-HC) 25 MG suppository Place 1 suppository (25 mg total) rectally at bedtime.  12 suppository  3  . hyoscyamine (LEVSIN SL) 0.125 MG SL tablet Take 1 tablet by mouth every morning and every 4 hours as needed for cramping.  30 tablet  3  . lisinopril-hydrochlorothiazide (PRINZIDE,ZESTORETIC) 20-12.5 MG per tablet TAKE ONE TABLET BY MOUTH EVERY DAY  90 tablet  3  . loratadine (CLARITIN) 10 MG tablet Take 10 mg by mouth as needed.       . NON FORMULARY Allergy vaccine. Once a week       . omeprazole (PRILOSEC) 20 MG capsule Take 1 capsule (20 mg total) by mouth daily.  90 capsule  3  . potassium chloride SA (K-DUR,KLOR-CON) 20 MEQ tablet Take 1 tablet (20 mEq total) by mouth daily.  90 tablet  3  . predniSONE (DELTASONE) 10 MG tablet Take 4 X 2 DAYS, 3 X 2 DAYS, 2 X 2 DAYS, 1 X 2 DAYS  20 tablet  0  . pyridOXINE (VITAMIN B-6) 100 MG tablet Take 100 mg by mouth daily.        . sertraline (ZOLOFT) 100 MG tablet Take 1 tablet (100 mg total) by mouth daily.  90 tablet  3  . sulfamethoxazole-trimethoprim (BACTRIM DS) 800-160 MG per tablet Take 1 tablet by mouth 2 (two) times daily.  20 tablet  0  . SYMBICORT 80-4.5 MCG/ACT inhaler INHALE 2 PUFFS INTO THE LUNGS  TWICE A DAY  1 Inhaler  4   No current facility-administered medications on file prior to visit.

## 2014-01-26 NOTE — Telephone Encounter (Signed)
Per CY-okay to give breathing tx. Pt to be here at 11:00am to have acute visit.

## 2014-01-26 NOTE — Patient Instructions (Signed)
Continue your routine meds  Neb xop 0.63  Depo 80  Please call as needed

## 2014-01-26 NOTE — Telephone Encounter (Signed)
For neb

## 2014-01-31 ENCOUNTER — Telehealth: Payer: Self-pay | Admitting: Internal Medicine

## 2014-01-31 MED ORDER — PREDNISONE 10 MG PO TABS: ORAL_TABLET | ORAL | Status: DC

## 2014-01-31 NOTE — Telephone Encounter (Signed)
Last OV on 01-26-14. Pt states that she is still having a productive cough with small amount of yellow phlegm. She states this is not any better. She also states she is having a lot of wheezing as well. She states she went to ER Monday night and was given a rescue inhaler that she is having to use at least three times a day. She states her cough is no better since OV on 3-20 when she was given a depo and neb treatment. She states the ER did also give her 5 tablets of 20mg  prednisone, she has 2 days left of this. Pt denies any chest tightness or SOB at this time. Please advise.  Allergies  Allergen Reactions  . Doxycycline     REACTION: jittery

## 2014-01-31 NOTE — Telephone Encounter (Signed)
Pt called back & states # she gave was incorrect.  Correct # to call is [phone].

## 2014-01-31 NOTE — Telephone Encounter (Signed)
Called and made aware of recs. rx called in.

## 2014-01-31 NOTE — Telephone Encounter (Signed)
Suggest she change her prednisone to our taper: Prednisone 10 mg, # 20, 4 X 2 DAYS, 3 X 2 DAYS, 2 X 2 DAYS, 1 X 2 DAYS

## 2014-02-02 ENCOUNTER — Ambulatory Visit (INDEPENDENT_AMBULATORY_CARE_PROVIDER_SITE_OTHER): Payer: BC Managed Care – PPO | Admitting: Family Medicine

## 2014-02-02 ENCOUNTER — Encounter: Payer: Self-pay | Admitting: Family Medicine

## 2014-02-02 VITALS — BP 100/62 | HR 82 | Temp 98.5°F | Ht 61.0 in | Wt 188.0 lb

## 2014-02-02 DIAGNOSIS — J209 Acute bronchitis, unspecified: Secondary | ICD-10-CM

## 2014-02-02 MED ORDER — HYDROCODONE-HOMATROPINE 5-1.5 MG/5ML PO SYRP: 5.0000 mL | ORAL_SOLUTION | ORAL | Status: DC | PRN

## 2014-02-02 MED ORDER — AZITHROMYCIN 250 MG PO TABS
ORAL_TABLET | ORAL | Status: DC
Start: 2014-02-02 — End: 2014-03-08

## 2014-02-02 NOTE — Progress Notes (Signed)
   Subjective:    Patient ID: RUCHAMA KUBICEK, female    DOB: 17-Apr-1951, 63 y.o.   MRN: [id number]  HPI Here for 2 weeks of PND, chest tightness and a dry cough. She saw Dr. Annamaria Boots a week ago and was put on steroids. This has not helped. She uses Robitussin with little effect. No fever.    Review of Systems  Constitutional: Negative.   HENT: Positive for congestion and postnasal drip.   Eyes: Negative.   Respiratory: Positive for cough.        Objective:   Physical Exam  Constitutional: She appears well-developed and well-nourished.  HENT:  Right Ear: External ear normal.  Left Ear: External ear normal.  Nose: Nose normal.  Mouth/Throat: Oropharynx is clear and moist.  Eyes: Conjunctivae are normal.  Pulmonary/Chest: Effort normal. No respiratory distress. She has no wheezes. She has no rales.  Scattered rhonchi   Lymphadenopathy:    She has no cervical adenopathy.          Assessment & Plan:  Add Mucinex

## 2014-02-02 NOTE — Progress Notes (Signed)
Pre visit review using our clinic review tool, if applicable. No additional management support is needed unless otherwise documented below in the visit note. 

## 2014-02-16 ENCOUNTER — Ambulatory Visit (INDEPENDENT_AMBULATORY_CARE_PROVIDER_SITE_OTHER): Payer: BC Managed Care – PPO

## 2014-02-16 DIAGNOSIS — J309 Allergic rhinitis, unspecified: Secondary | ICD-10-CM

## 2014-02-19 ENCOUNTER — Other Ambulatory Visit (INDEPENDENT_AMBULATORY_CARE_PROVIDER_SITE_OTHER): Payer: BC Managed Care – PPO

## 2014-02-19 DIAGNOSIS — Z0000 Encounter for general adult medical examination without abnormal findings: Secondary | ICD-10-CM

## 2014-02-19 LAB — LIPID PANEL
Cholesterol: 224 mg/dL — ABNORMAL HIGH (ref 0–200)
HDL: 53.1 mg/dL (ref >39.00)
LDL Cholesterol: 143 mg/dL — ABNORMAL HIGH (ref 0–99)
TRIGLYCERIDES: 141 mg/dL (ref 0.0–149.0)
Total CHOL/HDL Ratio: 4
VLDL: 28.2 mg/dL (ref 0.0–40.0)

## 2014-02-19 LAB — POCT URINALYSIS DIPSTICK
BILIRUBIN UA: NEGATIVE
Glucose, UA: NEGATIVE
KETONES UA: NEGATIVE
Nitrite, UA: NEGATIVE
Protein, UA: NEGATIVE
RBC UA: NEGATIVE
SPEC GRAV UA: 1.015
Urobilinogen, UA: 1
pH, UA: 7

## 2014-02-19 LAB — CBC WITH DIFFERENTIAL/PLATELET
BASOS ABS: 0 10*3/uL (ref 0.0–0.1)
BASOS PCT: 0.5 % (ref 0.0–3.0)
EOS ABS: 0.1 10*3/uL (ref 0.0–0.7)
Eosinophils Relative: 2 % (ref 0.0–5.0)
HCT: 35.8 % — ABNORMAL LOW (ref 36.0–46.0)
Hemoglobin: 11.8 g/dL — ABNORMAL LOW (ref 12.0–15.0)
LYMPHS PCT: 24 % (ref 12.0–46.0)
Lymphs Abs: 1.6 10*3/uL (ref 0.7–4.0)
MCHC: 32.9 g/dL (ref 30.0–36.0)
MCV: 89 fl (ref 78.0–100.0)
Monocytes Absolute: 0.4 10*3/uL (ref 0.1–1.0)
Monocytes Relative: 5.7 % (ref 3.0–12.0)
NEUTROS PCT: 67.8 % (ref 43.0–77.0)
Neutro Abs: 4.5 10*3/uL (ref 1.4–7.7)
Platelets: 173 10*3/uL (ref 150.0–400.0)
RBC: 4.03 Mil/uL (ref 3.87–5.11)
RDW: 14.5 % (ref 11.5–14.6)
WBC: 6.6 10*3/uL (ref 4.5–10.5)

## 2014-02-19 LAB — BASIC METABOLIC PANEL
BUN: 14 mg/dL (ref 6–23)
CALCIUM: 9.2 mg/dL (ref 8.4–10.5)
CO2: 28 mEq/L (ref 19–32)
CREATININE: 0.8 mg/dL (ref 0.4–1.2)
Chloride: 105 mEq/L (ref 96–112)
GFR: 75.33 mL/min (ref >60.00)
Glucose, Bld: 84 mg/dL (ref 70–99)
Potassium: 4 mEq/L (ref 3.5–5.1)
SODIUM: 141 meq/L (ref 135–145)

## 2014-02-19 LAB — HEPATIC FUNCTION PANEL
ALBUMIN: 3.7 g/dL (ref 3.5–5.2)
ALK PHOS: 49 U/L (ref 39–117)
ALT: 17 U/L (ref 0–35)
AST: 16 U/L (ref 0–37)
BILIRUBIN DIRECT: 0 mg/dL (ref 0.0–0.3)
Total Bilirubin: 0.5 mg/dL (ref 0.3–1.2)
Total Protein: 6.6 g/dL (ref 6.0–8.3)

## 2014-02-19 LAB — TSH: TSH: 1.72 u[IU]/mL (ref 0.35–5.50)

## 2014-02-23 ENCOUNTER — Ambulatory Visit: Payer: BC Managed Care – PPO

## 2014-02-27 ENCOUNTER — Encounter: Payer: Self-pay | Admitting: Family Medicine

## 2014-02-27 ENCOUNTER — Ambulatory Visit (INDEPENDENT_AMBULATORY_CARE_PROVIDER_SITE_OTHER): Payer: BC Managed Care – PPO | Admitting: Family Medicine

## 2014-02-27 VITALS — BP 116/68 | HR 84 | Temp 98.6°F | Ht 61.0 in | Wt 190.0 lb

## 2014-02-27 DIAGNOSIS — Z0000 Encounter for general adult medical examination without abnormal findings: Secondary | ICD-10-CM

## 2014-02-27 MED ORDER — SERTRALINE HCL 100 MG PO TABS: 100.0000 mg | ORAL_TABLET | Freq: Every day | ORAL | Status: DC

## 2014-02-27 MED ORDER — LISINOPRIL-HYDROCHLOROTHIAZIDE 20-12.5 MG PO TABS: ORAL_TABLET | ORAL | Status: DC

## 2014-02-27 MED ORDER — OMEPRAZOLE 20 MG PO CPDR: 20.0000 mg | DELAYED_RELEASE_CAPSULE | Freq: Every day | ORAL | Status: DC

## 2014-02-27 MED ORDER — POTASSIUM CHLORIDE CRYS ER 20 MEQ PO TBCR: 20.0000 meq | EXTENDED_RELEASE_TABLET | Freq: Every day | ORAL | Status: DC

## 2014-02-27 NOTE — Progress Notes (Signed)
   Subjective:    Patient ID: [name], female    DOB: [birth year], 63 y.o.   MRN: [id number]  HPI 63 yr old female for a cpx. She feels well.    Review of Systems  Constitutional: Negative.   HENT: Negative.   Eyes: Negative.   Respiratory: Negative.   Cardiovascular: Negative.   Gastrointestinal: Negative.   Genitourinary: Negative for dysuria, urgency, frequency, hematuria, flank pain, decreased urine volume, enuresis, difficulty urinating, pelvic pain and dyspareunia.  Musculoskeletal: Negative.   Skin: Negative.   Neurological: Negative.   Psychiatric/Behavioral: Negative.        Objective:   Physical Exam  Constitutional: She is oriented to person, place, and time. She appears well-developed and well-nourished. No distress.  HENT:  Head: Normocephalic and atraumatic.  Right Ear: External ear normal.  Left Ear: External ear normal.  Nose: Nose normal.  Mouth/Throat: Oropharynx is clear and moist. No oropharyngeal exudate.  Eyes: Conjunctivae and EOM are normal. Pupils are equal, round, and reactive to light. No scleral icterus.  Neck: Normal range of motion. Neck supple. No JVD present. No thyromegaly present.  Cardiovascular: Normal rate, regular rhythm, normal heart sounds and intact distal pulses.  Exam reveals no gallop and no friction rub.   No murmur heard. EKG normal   Pulmonary/Chest: Effort normal and breath sounds normal. No respiratory distress. She has no wheezes. She has no rales. She exhibits no tenderness.  Abdominal: Soft. Bowel sounds are normal. She exhibits no distension and no mass. There is no tenderness. There is no rebound and no guarding.  Musculoskeletal: Normal range of motion. She exhibits no edema and no tenderness.  Lymphadenopathy:    She has no cervical adenopathy.  Neurological: She is alert and oriented to person, place, and time. She has normal reflexes. No cranial nerve deficit. She exhibits normal muscle tone. Coordination  normal.  Skin: Skin is warm and dry. No rash noted. No erythema.  Psychiatric: She has a normal mood and affect. Her behavior is normal. Judgment and thought content normal.          Assessment & Plan:  Well exam. She will be due for a colonoscopy in August

## 2014-02-27 NOTE — Progress Notes (Signed)
Pre visit review using our clinic review tool, if applicable. No additional management support is needed unless otherwise documented below in the visit note. 

## 2014-03-02 ENCOUNTER — Ambulatory Visit (INDEPENDENT_AMBULATORY_CARE_PROVIDER_SITE_OTHER): Payer: BC Managed Care – PPO

## 2014-03-02 DIAGNOSIS — J309 Allergic rhinitis, unspecified: Secondary | ICD-10-CM

## 2014-03-08 ENCOUNTER — Ambulatory Visit (INDEPENDENT_AMBULATORY_CARE_PROVIDER_SITE_OTHER): Payer: BC Managed Care – PPO

## 2014-03-08 ENCOUNTER — Ambulatory Visit (INDEPENDENT_AMBULATORY_CARE_PROVIDER_SITE_OTHER): Payer: BC Managed Care – PPO | Admitting: Internal Medicine

## 2014-03-08 ENCOUNTER — Encounter: Payer: Self-pay | Admitting: Internal Medicine

## 2014-03-08 VITALS — BP 130/78 | HR 82 | Ht 61.0 in | Wt 190.6 lb

## 2014-03-08 DIAGNOSIS — J45909 Unspecified asthma, uncomplicated: Secondary | ICD-10-CM

## 2014-03-08 DIAGNOSIS — J301 Allergic rhinitis due to pollen: Secondary | ICD-10-CM

## 2014-03-08 DIAGNOSIS — J309 Allergic rhinitis, unspecified: Secondary | ICD-10-CM

## 2014-03-08 DIAGNOSIS — J4520 Mild intermittent asthma, uncomplicated: Secondary | ICD-10-CM

## 2014-03-08 MED ORDER — FLUTICASONE PROPIONATE 50 MCG/ACT NA SUSP
2.0000 | Freq: Every day | NASAL | Status: DC
Start: 2014-03-08 — End: 2014-06-06

## 2014-03-08 MED ORDER — BUDESONIDE-FORMOTEROL FUMARATE 80-4.5 MCG/ACT IN AERO: INHALATION_SPRAY | RESPIRATORY_TRACT | Status: DC

## 2014-03-08 MED ORDER — ALBUTEROL SULFATE HFA 108 (90 BASE) MCG/ACT IN AERS: 2.0000 | INHALATION_SPRAY | Freq: Four times a day (QID) | RESPIRATORY_TRACT | Status: DC | PRN

## 2014-03-08 NOTE — Patient Instructions (Signed)
Med refills sent  You might try spraying saline nasal spray in your nose occasionally if you feel dry.

## 2014-03-08 NOTE — Progress Notes (Signed)
Subjective:    Patient ID: Stacy DEDE, female    DOB: 28-Jan-1951, 63 y.o.   MRN: [id number]  HPI 16 yoF never smoker, followed for allergy and asthma. Last here September 08, 2010 having had recent bronchitis. Mild winter with no major problems. In the last 3 weeks has blamed pollen for waking with head congestion. Blowing nose, ears stopped up.Chest ok and denies infection. Once upright in the morning and after claritin she is ok the rest of the day. Saline nasal spray helps. Decongestant sprays cause nose bleed.  Chest has done very well with Symbicort. At least 3 weeks since last used Proair rescue inhaler. Stress at work sometimes makes her tight.  09/03/11-  60 yoF never smoker, followed for allergy and asthma. For the last 2 days has had maxillary pressure and upper chest congestion. People at work have been sneezing and coughing. She has not checked her temperature but feels a little flushed. Nothing purulent or painful. Continues Symbicort and uses rescue inhaler about twice a week. No problems with allergy vaccine. Has had flu vaccine. Continues as stressed as she works on her divorce.  03/03/12- 39 yoF never smoker, followed for allergy and asthma. Went to urgent care for sinusitis 3 or 4 weeks ago. Improved but wants prescription for Flonase. Continues allergy vaccine. Asthma under good control with no wheezing and rare need for rescue inhaler. Persistent mild hacking cough. We discussed this because of her lisinopril and left it for her to decide whether it was a concern to discuss with her primary physician.  09/02/12- 60 yoF never smoker, followed for allergy and asthma. Still on allergy vaccine 1:10 GH and doing well. Denies any flare ups at this time. Had flu vaccine. Minor cough on lisinopril. She considers it is significant. Not much rhinitis this fall. CXR 03/03/12 IMPRESSION:  1. No radiographic evidence of acute cardiopulmonary disease.  Original Report Authenticated By:  Etheleen Mayhew, M.D.   01/30/13- 59 yoF never smoker, followed for allergy and asthma. ACUTE VISIT:chest congestion; wheezing, asthma attack last night.  Still on allergy vaccine 1:10 GH  03/03/13-61 yoF never smoker, followed for allergy and asthma. FOLLOWS FOR: still on allergy vaccine 1:10 GH and doing well; no flare ups at this time-using Loratadine daily and helping. tolerating spring pollens Resolved acute illness in late March. Don't identify problems from lisinopril-little cough  09/08/13- 33 yoF never smoker, followed for allergy and asthma. FOLLOWS FOR:  still on Allergy vaccine 1:10 GH and doing well She is noticing easy dyspnea on exertion walking up long chills, without chest pain. This may not be a real change. Occasional cough doesn't bother her. Mild dry throat tickle (lisinopril). Out of Flonase. PFT in 2009: Within normal limits-FEV1/FVC 0.83 with FEV1 2.64/130%.  01/26/14- 74 yoF never smoker, followed for allergy and asthma.  allergy vaccine 1:10 GH FOLLOWS TXM:IWOEHOZYY on Sulfa for boil on body-requesting breathing treatment. Woke this AM with progressive cough, wheeze. No sore throat, fever. Started loratadine 3 weeks ago. Continues allergy vaccine 1:10 [place] inguinal boil almost resolved.   03/08/14- 63 yoF never smoker, followed for allergy and asthma. FOLLOWS FOR:  Allergy Vaccine 1:10 GH doing well.  No concerns today. Continues Flonase, loratadine. No wheezing on Symbicort. Spring pollen season has been well controlled.  ROS-see HPI Constitutional:   No-   weight loss, night sweats, fevers, chills, fatigue, lassitude. HEENT:   No-  headaches, difficulty swallowing, tooth/dental problems, sore throat,  No-  sneezing, itching, ear ache, nasal congestion, post nasal drip,  CV:  No-   chest pain, orthopnea, PND, swelling in lower extremities, anasarca, dizziness, palpitations Resp: +shortness of breath with exertion or at rest.              No-   productive  cough,  No- non-productive cough,  No- coughing up of blood.              No-   change in color of mucus.  No- wheezing.   Skin: No-   rash or lesions. GI:  No-   heartburn, indigestion, abdominal pain, nausea, vomiting, GU:  MS:  No-   joint pain or swelling.   Neuro-     nothing unusual Psych:  No- change in mood or affect.   Objective:  OBJ- Physical Exam General- Alert, Oriented, Affect-appropriate/ cheerful, Distress- none acute. obese Skin- rash-none, lesions- none, excoriation- none Lymphadenopathy- none Head- atraumatic            Eyes- Gross vision intact, PERRLA, conjunctivae and secretions clear            Ears- Hearing, canals-normal            Nose- +turbinate edema, no-Septal dev,polyps, erosion, perforation             Throat- Mallampati II-III , mucosa clear , drainage- none, tonsils- atrophic, +dentures Neck- flexible , trachea midline, no stridor , thyroid nl, carotid no bruit Chest - symmetrical excursion , unlabored           Heart/CV- RRR , no murmur , no gallop  , no rub, nl s1 s2                           - JVD- none , edema- none, stasis changes- none, varices- none           Lung- clear to P&A, , cough-none, wheeze-none , dullness-none, rub- none           Chest wall-  Abd-  Br/ Gen/ Rectal- Not done, not indicated Extrem- cyanosis- none, clubbing, none, atrophy- none, strength- nl Neuro- grossly intact to observation

## 2014-03-16 ENCOUNTER — Ambulatory Visit (INDEPENDENT_AMBULATORY_CARE_PROVIDER_SITE_OTHER): Payer: BC Managed Care – PPO

## 2014-03-16 DIAGNOSIS — J309 Allergic rhinitis, unspecified: Secondary | ICD-10-CM

## 2014-03-23 ENCOUNTER — Ambulatory Visit (INDEPENDENT_AMBULATORY_CARE_PROVIDER_SITE_OTHER): Payer: BC Managed Care – PPO

## 2014-03-23 DIAGNOSIS — J309 Allergic rhinitis, unspecified: Secondary | ICD-10-CM

## 2014-03-30 ENCOUNTER — Ambulatory Visit (INDEPENDENT_AMBULATORY_CARE_PROVIDER_SITE_OTHER): Payer: BC Managed Care – PPO

## 2014-03-30 DIAGNOSIS — J309 Allergic rhinitis, unspecified: Secondary | ICD-10-CM

## 2014-04-06 ENCOUNTER — Ambulatory Visit: Payer: BC Managed Care – PPO

## 2014-04-08 NOTE — Assessment & Plan Note (Signed)
Well controlled Plan-refill for Symbicort and pro air

## 2014-04-08 NOTE — Assessment & Plan Note (Signed)
She feels allergy vaccine works well for her Plan-saline rinse, Flonase, continue allergy vaccine

## 2014-04-09 ENCOUNTER — Encounter: Payer: Self-pay | Admitting: Internal Medicine

## 2014-04-13 ENCOUNTER — Ambulatory Visit (INDEPENDENT_AMBULATORY_CARE_PROVIDER_SITE_OTHER): Payer: BC Managed Care – PPO

## 2014-04-13 DIAGNOSIS — J309 Allergic rhinitis, unspecified: Secondary | ICD-10-CM

## 2014-04-20 ENCOUNTER — Ambulatory Visit (INDEPENDENT_AMBULATORY_CARE_PROVIDER_SITE_OTHER): Payer: BC Managed Care – PPO

## 2014-04-20 DIAGNOSIS — J309 Allergic rhinitis, unspecified: Secondary | ICD-10-CM

## 2014-04-25 ENCOUNTER — Ambulatory Visit (INDEPENDENT_AMBULATORY_CARE_PROVIDER_SITE_OTHER): Payer: BC Managed Care – PPO | Admitting: Family Medicine

## 2014-04-25 VITALS — BP 146/88 | HR 84 | Temp 99.4°F

## 2014-04-25 DIAGNOSIS — J209 Acute bronchitis, unspecified: Secondary | ICD-10-CM

## 2014-04-26 ENCOUNTER — Encounter: Payer: Self-pay | Admitting: Family Medicine

## 2014-04-26 MED ORDER — METHYLPREDNISOLONE ACETATE 80 MG/ML IJ SUSP
120.0000 mg | Freq: Once | INTRAMUSCULAR | Status: AC
  Administered 2014-04-26: 120 mg via INTRAMUSCULAR

## 2014-04-26 NOTE — Addendum Note (Signed)
Addended by: Aggie Hacker A on: 04/26/2014 08:28 AM   Modules accepted: Orders

## 2014-04-26 NOTE — Progress Notes (Signed)
   Subjective:    Patient ID: [name], female    DOB: 25-Feb-1951, 63 y.o.   MRN: [id number]  HPI Here for 3 days of PND, ST, wheezing and coughing up green sputum. No fever. On Mucinex.   Review of Systems  Constitutional: Negative.   HENT: Positive for congestion and postnasal drip. Negative for sinus pressure.   Eyes: Negative.   Respiratory: Positive for cough and wheezing.        Objective:   Physical Exam  Constitutional: She appears well-developed and well-nourished.  HENT:  Right Ear: External ear normal.  Left Ear: External ear normal.  Nose: Nose normal.  Mouth/Throat: Oropharynx is clear and moist.  Eyes: Conjunctivae are normal.  Pulmonary/Chest: Effort normal. No respiratory distress. She has no rales.  Scattered wheezes and rhonchi   Lymphadenopathy:    She has no cervical adenopathy.          Assessment & Plan:  Given a steroid shot and a Zpack

## 2014-04-27 ENCOUNTER — Ambulatory Visit: Payer: BC Managed Care – PPO

## 2014-05-18 ENCOUNTER — Ambulatory Visit (INDEPENDENT_AMBULATORY_CARE_PROVIDER_SITE_OTHER): Payer: BC Managed Care – PPO

## 2014-05-18 DIAGNOSIS — J309 Allergic rhinitis, unspecified: Secondary | ICD-10-CM

## 2014-05-21 ENCOUNTER — Other Ambulatory Visit: Payer: Self-pay | Admitting: Internal Medicine

## 2014-05-25 ENCOUNTER — Ambulatory Visit (INDEPENDENT_AMBULATORY_CARE_PROVIDER_SITE_OTHER): Payer: BC Managed Care – PPO

## 2014-05-25 ENCOUNTER — Other Ambulatory Visit: Payer: Self-pay | Admitting: Internal Medicine

## 2014-05-25 DIAGNOSIS — J309 Allergic rhinitis, unspecified: Secondary | ICD-10-CM

## 2014-06-01 ENCOUNTER — Ambulatory Visit (INDEPENDENT_AMBULATORY_CARE_PROVIDER_SITE_OTHER): Payer: BC Managed Care – PPO

## 2014-06-01 DIAGNOSIS — J309 Allergic rhinitis, unspecified: Secondary | ICD-10-CM

## 2014-06-06 ENCOUNTER — Ambulatory Visit (AMBULATORY_SURGERY_CENTER): Payer: Self-pay | Admitting: *Deleted

## 2014-06-06 ENCOUNTER — Ambulatory Visit (INDEPENDENT_AMBULATORY_CARE_PROVIDER_SITE_OTHER): Payer: BC Managed Care – PPO

## 2014-06-06 VITALS — Ht 61.0 in | Wt 194.4 lb

## 2014-06-06 DIAGNOSIS — Z800 Family history of malignant neoplasm of digestive organs: Secondary | ICD-10-CM

## 2014-06-06 DIAGNOSIS — J309 Allergic rhinitis, unspecified: Secondary | ICD-10-CM

## 2014-06-06 MED ORDER — MOVIPREP 100 G PO SOLR: ORAL | Status: DC

## 2014-06-06 NOTE — Progress Notes (Signed)
No allergies to eggs or soy. No problems with anesthesia.  Pt given Emmi instructions for colonoscopy  No oxygen use  No diet drug use  

## 2014-06-08 ENCOUNTER — Ambulatory Visit: Payer: BC Managed Care – PPO

## 2014-06-08 ENCOUNTER — Ambulatory Visit (INDEPENDENT_AMBULATORY_CARE_PROVIDER_SITE_OTHER): Payer: BC Managed Care – PPO

## 2014-06-08 DIAGNOSIS — J309 Allergic rhinitis, unspecified: Secondary | ICD-10-CM

## 2014-06-15 ENCOUNTER — Ambulatory Visit (INDEPENDENT_AMBULATORY_CARE_PROVIDER_SITE_OTHER): Payer: BC Managed Care – PPO

## 2014-06-15 DIAGNOSIS — J309 Allergic rhinitis, unspecified: Secondary | ICD-10-CM

## 2014-06-20 ENCOUNTER — Encounter: Payer: Self-pay | Admitting: Internal Medicine

## 2014-06-20 ENCOUNTER — Ambulatory Visit (AMBULATORY_SURGERY_CENTER): Payer: BC Managed Care – PPO | Admitting: Internal Medicine

## 2014-06-20 ENCOUNTER — Ambulatory Visit: Payer: BC Managed Care – PPO

## 2014-06-20 VITALS — BP 111/82 | HR 65 | Temp 98.4°F | Resp 16 | Ht 61.0 in | Wt 194.0 lb

## 2014-06-20 DIAGNOSIS — Z1211 Encounter for screening for malignant neoplasm of colon: Secondary | ICD-10-CM

## 2014-06-20 DIAGNOSIS — D126 Benign neoplasm of colon, unspecified: Secondary | ICD-10-CM

## 2014-06-20 DIAGNOSIS — Z800 Family history of malignant neoplasm of digestive organs: Secondary | ICD-10-CM

## 2014-06-20 HISTORY — PX: COLONOSCOPY: SHX174

## 2014-06-20 MED ORDER — HYDROCORTISONE ACETATE 25 MG RE SUPP: 25.0000 mg | Freq: Every day | RECTAL | Status: DC

## 2014-06-20 MED ORDER — SODIUM CHLORIDE 0.9 % IV SOLN: 500.0000 mL | INTRAVENOUS | Status: DC

## 2014-06-20 NOTE — Op Note (Signed)
[facility]  [address] [place], [id number]   COLONOSCOPY PROCEDURE REPORT  PATIENT: Stacy, Carpenter  MR#: [id number] BIRTHDATE: 1951-02-04 , 63  yrs. old GENDER: Female ENDOSCOPIST: Lafayette Dragon, MD REFERRED BY:Dr [name]  PROCEDURE DATE:  06/20/2014 PROCEDURE:   Colonoscopy with cold biopsy polypectomy First Screening Colonoscopy - Avg.  risk and is 50 yrs.  old or older - No.  Prior Negative Screening - Now for repeat screening. Above average risk  History of Adenoma - Now for follow-up colonoscopy & has been > or = to 3 yrs.  Yes hx of adenoma.  Has been 3 or more years since last colonoscopy.  Polyps Removed Today? Yes. ASA CLASS:   Class II INDICATIONS:Patient's immediate family history of colon cancer and fourth parents with colon cancer.  Prior colonoscopy in 1999, 2007, August 2010. MEDICATIONS: MAC sedation, administered by CRNA and Propofol (Diprivan) 230 mg IV  DESCRIPTION OF PROCEDURE:   After the risks benefits and alternatives of the procedure were thoroughly explained, informed consent was obtained.  A digital rectal exam revealed no abnormalities of the rectum.   The LB PCF Q180 [id number]  endoscope was introduced through the anus and advanced to the cecum, which was identified by both the appendix and ileocecal valve. No adverse events experienced.   The quality of the prep was good, using MoviPrep  The instrument was then slowly withdrawn as the colon was fully examined.      COLON FINDINGS: A sessile polyp ranging between 3-45mm in size was found at the cecum.  A polypectomy was performed with cold forceps. The resection was complete and the polyp tissue was completely retrieved.   Small internal hemorrhoids were found.   Mild diverticulosis was noted in the sigmoid colon.  Retroflexed views revealed no abnormalities. The time to cecum=6 minutes 52 seconds. Withdrawal time=8 minutes 47 seconds.  The scope was withdrawn and the  procedure completed. COMPLICATIONS: There were no complications.  ENDOSCOPIC IMPRESSION: 1.   Sessile polyp ranging between 3-99mm in size was found at the cecum; polypectomy was performed with cold forceps 2.   Small internal hemorrhoids 3.   Mild diverticulosis was noted in the sigmoid colon  RECOMMENDATIONS: 1.  Await pathology results 2.  high fiber diet Recall colonoscopy pending path report, probably in 5 years   eSigned:  Lafayette Dragon, MD 06/20/2014 10:21 AM   cc:   PATIENT NAME:  Stacy, Carpenter MR#: [id number]

## 2014-06-20 NOTE — Patient Instructions (Signed)
A REORDER FOR YOUR ANUCORT HAS BEEN ELECTRONICALLY SENT TO YOUR [facility]    YOU HAD AN ENDOSCOPIC PROCEDURE TODAY AT [facility]: Refer to the procedure report that was given to you for any specific questions about what was found during the examination.  If the procedure report does not answer your questions, please call your gastroenterologist to clarify.  If you requested that your care partner not be given the details of your procedure findings, then the procedure report has been included in a sealed envelope for you to review at your convenience later.  YOU SHOULD EXPECT: Some feelings of bloating in the abdomen. Passage of more gas than usual.  Walking can help get rid of the air that was put into your GI tract during the procedure and reduce the bloating. If you had a lower endoscopy (such as a colonoscopy or flexible sigmoidoscopy) you may notice spotting of blood in your stool or on the toilet paper. If you underwent a bowel prep for your procedure, then you may not have a normal bowel movement for a few days.  DIET: Your first meal following the procedure should be a light meal and then it is ok to progress to your normal diet.  A half-sandwich or bowl of soup is an example of a good first meal.  Heavy or fried foods are harder to digest and may make you feel nauseous or bloated.  Likewise meals heavy in dairy and vegetables can cause extra gas to form and this can also increase the bloating.  Drink plenty of fluids but you should avoid alcoholic beverages for 24 hours.  ACTIVITY: Your care partner should take you home directly after the procedure.  You should plan to take it easy, moving slowly for the rest of the day.  You can resume normal activity the day after the procedure however you should NOT DRIVE or use heavy machinery for 24 hours (because of the sedation medicines used during the test).    SYMPTOMS TO REPORT IMMEDIATELY: A gastroenterologist can be  reached at any hour.  During normal business hours, 8:30 AM to 5:00 PM Monday through Friday, call [phone].  After hours and on weekends, please call the GI answering service at [phone] who will take a message and have the physician on call contact you.   Following lower endoscopy (colonoscopy or flexible sigmoidoscopy):  Excessive amounts of blood in the stool  Significant tenderness or worsening of abdominal pains  Swelling of the abdomen that is new, acute  Fever of 100F or higher  FOLLOW UP: If any biopsies were taken you will be contacted by phone or by letter within the next 1-3 weeks.  Call your gastroenterologist if you have not heard about the biopsies in 3 weeks.  Our staff will call the home number listed on your records the next business day following your procedure to check on you and address any questions or concerns that you may have at that time regarding the information given to you following your procedure. This is a courtesy call and so if there is no answer at the home number and we have not heard from you through the emergency physician on call, we will assume that you have returned to your regular daily activities without incident.  SIGNATURES/CONFIDENTIALITY: You and/or your care partner have signed paperwork which will be entered into your electronic medical record.  These signatures attest to the fact that that the information above on your After  Visit Summary has been reviewed and is understood.  Full responsibility of the confidentiality of this discharge information lies with you and/or your care-partner. 

## 2014-06-20 NOTE — Progress Notes (Signed)
Called to room to assist during endoscopic procedure.  Patient ID and intended procedure confirmed with present staff. Received instructions for my participation in the procedure from the performing physician.  

## 2014-06-20 NOTE — Progress Notes (Signed)
Procedure ends, to recovery, report given and VSS. 

## 2014-06-22 ENCOUNTER — Ambulatory Visit (INDEPENDENT_AMBULATORY_CARE_PROVIDER_SITE_OTHER): Payer: BC Managed Care – PPO

## 2014-06-22 ENCOUNTER — Telehealth: Payer: Self-pay

## 2014-06-22 DIAGNOSIS — J309 Allergic rhinitis, unspecified: Secondary | ICD-10-CM

## 2014-06-22 NOTE — Telephone Encounter (Signed)
Unable to leave message telephone number disconnected.

## 2014-06-26 ENCOUNTER — Encounter: Payer: Self-pay | Admitting: Internal Medicine

## 2014-06-28 ENCOUNTER — Encounter: Payer: Self-pay | Admitting: *Deleted

## 2014-07-03 ENCOUNTER — Ambulatory Visit (INDEPENDENT_AMBULATORY_CARE_PROVIDER_SITE_OTHER): Payer: BC Managed Care – PPO | Admitting: Family

## 2014-07-03 ENCOUNTER — Telehealth: Payer: Self-pay | Admitting: Family Medicine

## 2014-07-03 ENCOUNTER — Encounter: Payer: Self-pay | Admitting: Family

## 2014-07-03 VITALS — BP 98/60 | HR 90 | Temp 98.0°F | Wt 192.0 lb

## 2014-07-03 DIAGNOSIS — R197 Diarrhea, unspecified: Secondary | ICD-10-CM

## 2014-07-03 DIAGNOSIS — K589 Irritable bowel syndrome without diarrhea: Secondary | ICD-10-CM

## 2014-07-03 MED ORDER — SERTRALINE HCL 100 MG PO TABS: 150.0000 mg | ORAL_TABLET | Freq: Every day | ORAL | Status: DC

## 2014-07-03 NOTE — Progress Notes (Signed)
Subjective:    Patient ID: [name], female    DOB: 1951-01-21, 63 y.o.   MRN: [id number]  HPI 63 year old white female, presents today with complaints of diarrhea x3 days. Patient reports having loose stools prior to her colonoscopy one week ago. After the colonoscopy, her stools returned to note normal and have since become loose again. Due to having dental issues, she has been consuming foods that are softer too. Also reports increased stress that she rates as 10 out of 10. Fundus not happy with her proposed marriage. Denies any blood in her stools, dark black stools, abdominal pain, fever or chills. Has taken antidiarrheal medication that has helped.   Review of Systems  Constitutional: Negative.   Respiratory: Negative.   Cardiovascular: Negative.   Gastrointestinal: Positive for nausea. Negative for abdominal distention.  Endocrine: Negative.   Genitourinary: Negative.   Musculoskeletal: Negative.   Skin: Negative.   Allergic/Immunologic: Negative.   Neurological: Negative.   Psychiatric/Behavioral: The patient is nervous/anxious.        Increased stress   Past Medical History  Diagnosis Date  . Allergy   . GERD (gastroesophageal reflux disease)   . Hypertension   . Depression   . Gynecological examination     sees Dr. Paula Compton  . Asthma     sees Dr. Baird Lyons  . Diverticulosis   . Colon polyp 2007  . Hyperlipemia     History   Social History  . Marital Status: Legally Separated    Spouse Name: N/A    Number of Children: 2  . Years of Education: N/A   Occupational History  .  [employer]   Social History Main Topics  . Smoking status: Never Smoker   . Smokeless tobacco: Never Used  . Alcohol Use: No  . Drug Use: No  . Sexual Activity: Not on file   Other Topics Concern  . Not on file   Social History Narrative   Daily caffeine     Past Surgical History  Procedure Laterality Date  . Carpal tunnel release Right 1980  .  Colonoscopy  06-27-09    per Dr. Olevia Perches, repeat in 5 yrs   . Dilation and curettage of uterus  2005    Family History  Problem Relation Age of Onset  . Colon cancer Mother 62  . Colon cancer Father 50  . Liver cancer Father   . Diabetes Sister   . Throat cancer Brother   . Colon cancer Maternal Aunt     not sure age of onset  . Colon cancer Maternal Aunt     not sure age of onset  . Pancreatic cancer Neg Hx   . Rectal cancer Neg Hx   . Stomach cancer Neg Hx     Allergies  Allergen Reactions  . Doxycycline     REACTION: jittery    Current Outpatient Prescriptions on File Prior to Visit  Medication Sig Dispense Refill  . albuterol (PROVENTIL HFA;VENTOLIN HFA) 108 (90 BASE) MCG/ACT inhaler Inhale 2 puffs into the lungs as needed for wheezing or shortness of breath.      Marland Kitchen aspirin 81 MG tablet Take 81 mg by mouth daily.        . budesonide-formoterol (SYMBICORT) 80-4.5 MCG/ACT inhaler 2 puffs then rinse mouth, twice daily maintenance inhaler  3 Inhaler  3  . Flaxseed, Linseed, (FLAX SEED OIL) 1000 MG CAPS Take 1 capsule by mouth daily.        Marland Kitchen  fluticasone (FLONASE) 50 MCG/ACT nasal spray Place 1 spray into both nostrils daily.      Marland Kitchen HYDROcodone-homatropine (HYDROMET) 5-1.5 MG/5ML syrup Take 5 mLs by mouth every 4 (four) hours as needed for cough.  240 mL  0  . hydrocortisone (ANUCORT-HC) 25 MG suppository Place 1 suppository (25 mg total) rectally at bedtime.  12 suppository  3  . hyoscyamine (LEVSIN SL) 0.125 MG SL tablet Take 1 tablet by mouth every morning and every 4 hours as needed for cramping.  30 tablet  3  . lisinopril-hydrochlorothiazide (PRINZIDE,ZESTORETIC) 20-12.5 MG per tablet TAKE ONE TABLET BY MOUTH EVERY DAY  90 tablet  3  . NON FORMULARY Allergy vaccine. Once a week       . omeprazole (PRILOSEC) 20 MG capsule Take 1 capsule (20 mg total) by mouth daily.  90 capsule  3  . potassium chloride SA (K-DUR,KLOR-CON) 20 MEQ tablet Take 1 tablet (20 mEq total) by mouth  daily.  90 tablet  3  . pyridOXINE (VITAMIN B-6) 100 MG tablet Take 100 mg by mouth daily.         No current facility-administered medications on file prior to visit.    BP 98/60  Pulse 90  Temp(Src) 98 F (36.7 C) (Oral)  Wt 192 lb (87.091 kg)chart    Objective:   Physical Exam  Constitutional: She is oriented to person, place, and time. She appears well-developed and well-nourished.  Neck: Normal range of motion. Neck supple.  Cardiovascular: Normal rate, regular rhythm and normal heart sounds.   Pulmonary/Chest: Effort normal and breath sounds normal.  Abdominal: Soft. Bowel sounds are normal. She exhibits no distension. There is no tenderness. There is no rebound.  Musculoskeletal: Normal range of motion.  Neurological: She is alert and oriented to person, place, and time.  Skin: Skin is warm and dry.  Psychiatric: She has a normal mood and affect.          Assessment & Plan:  Aneth was seen today for diarrhea.  Diagnoses and associated orders for this visit:  Diarrhea  IBS (irritable bowel syndrome)  Other Orders - sertraline (ZOLOFT) 100 MG tablet; Take 1.5 tablets (150 mg total) by mouth daily.   Call the office with any questions or concern. Imodium as needed. Increase Zoloft to 150 mg and take daily. Recheck in 3 weeks.

## 2014-07-03 NOTE — Patient Instructions (Signed)
Irritable Bowel Syndrome Irritable bowel syndrome (IBS) is caused by a disturbance of normal bowel function and is a common digestive disorder. You may also hear this condition called spastic colon, mucous colitis, and irritable colon. There is no cure for IBS. However, symptoms often gradually improve or disappear with a good diet, stress management, and medicine. This condition usually appears in late adolescence or early adulthood. Women develop it twice as often as men. CAUSES  After food has been digested and absorbed in the small intestine, waste material is moved into the large intestine, or colon. In the colon, water and salts are absorbed from the undigested products coming from the small intestine. The remaining residue, or fecal material, is held for elimination. Under normal circumstances, gentle, rhythmic contractions of the bowel walls push the fecal material along the colon toward the rectum. In IBS, however, these contractions are irregular and poorly coordinated. The fecal material is either retained too long, resulting in constipation, or expelled too soon, producing diarrhea. SIGNS AND SYMPTOMS  The most common symptom of IBS is abdominal pain. It is often in the lower left side of the abdomen, but it may occur anywhere in the abdomen. The pain comes from spasms of the bowel muscles happening too much and from the buildup of gas and fecal material in the colon. This pain:  Can range from sharp abdominal cramps to a dull, continuous ache.  Often worsens soon after eating.  Is often relieved by having a bowel movement or passing gas. Abdominal pain is usually accompanied by constipation, but it may also produce diarrhea. The diarrhea often occurs right after a meal or upon waking up in the morning. The stools are often soft, watery, and flecked with mucus. Other symptoms of IBS include:  Bloating.  Loss of appetite.  Heartburn.  Backache.  Dull pain in the arms or  shoulders.  Nausea.  Burping.  Vomiting.  Gas. IBS may also cause symptoms that are unrelated to the digestive system, such as:  Fatigue.  Headaches.  Anxiety.  Shortness of breath.  Trouble concentrating.  Dizziness. These symptoms tend to come and go. DIAGNOSIS  The symptoms of IBS may seem like symptoms of other, more serious digestive disorders. Your health care provider may want to perform tests to exclude these disorders.  TREATMENT Many medicines are available to help correct bowel function or relieve bowel spasms and abdominal pain. Among the medicines available are:  Laxatives for severe constipation and to help restore normal bowel habits.  Specific antidiarrheal medicines to treat severe or lasting diarrhea.  Antispasmodic agents to relieve intestinal cramps. Your health care provider may also decide to treat you with a mild tranquilizer or sedative during unusually stressful periods in your life. Your health care provider may also prescribe antidepressant medicine. The use of this medicine has been shown to reduce pain and other symptoms of IBS. Remember that if any medicine is prescribed for you, you should take it exactly as directed. Make sure your health care provider knows how well it worked for you. HOME CARE INSTRUCTIONS   Take all medicines as directed by your health care provider.  Avoid foods that are high in fat or oils, such as heavy cream, butter, frankfurters, sausage, and other fatty meats.  Avoid foods that make you go to the bathroom, such as fruit, fruit juice, and dairy products.  Cut out carbonated drinks, chewing gum, and "gassy" foods such as beans and cabbage. This may help relieve bloating and burping.    Eat foods with bran, and drink plenty of liquids with the bran foods. This helps relieve constipation.  Keep track of what foods seem to bring on your symptoms.  Avoid emotionally charged situations or circumstances that produce  anxiety.  Start or continue exercising.  Get plenty of rest and sleep. Document Released: 10/26/2005 Document Revised: 10/31/2013 Document Reviewed: 06/15/2008 [facility] Patient Information 2015 [place], [place]. This information is not intended to replace advice given to you by your health care provider. Make sure you discuss any questions you have with your health care provider.  Stress and Stress Management Stress is a normal reaction to life events. It is what you feel when life demands more than you are used to or more than you can handle. Some stress can be useful. For example, the stress reaction can help you catch the last bus of the day, study for a test, or meet a deadline at work. But stress that occurs too often or for too long can cause problems. It can affect your emotional health and interfere with relationships and normal daily activities. Too much stress can weaken your immune system and increase your risk for physical illness. If you already have a medical problem, stress can make it worse. CAUSES  All sorts of life events may cause stress. An event that causes stress for one person may not be stressful for another person. Major life events commonly cause stress. These may be positive or negative. Examples include losing your job, moving into a new home, getting married, having a baby, or losing a loved one. Less obvious life events may also cause stress, especially if they occur day after day or in combination. Examples include working long hours, driving in traffic, caring for children, being in debt, or being in a difficult relationship. SIGNS AND SYMPTOMS Stress may cause emotional symptoms including, the following:  Anxiety. This is feeling worried, afraid, on edge, overwhelmed, or out of control.  Anger. This is feeling irritated or impatient.  Depression. This is feeling sad, down, helpless, or guilty.  Difficulty focusing, remembering, or making decisions. Stress may  cause physical symptoms, including the following:   Aches and pains. These may affect your head, neck, back, stomach, or other areas of your body.  Tight muscles or clenched jaw.  Low energy or trouble sleeping. Stress may cause unhealthy behaviors, including the following:   Eating to feel better (overeating) or skipping meals.  Sleeping too little, too much, or both.  Working too much or putting off tasks (procrastination).  Smoking, drinking alcohol, or using drugs to feel better. DIAGNOSIS  Stress is diagnosed through an assessment by your health care provider. Your health care provider will ask questions about your symptoms and any stressful life events.Your health care provider will also ask about your medical history and may order blood tests or other tests. Certain medical conditions and medicine can cause physical symptoms similar to stress. Mental illness can cause emotional symptoms and unhealthy behaviors similar to stress. Your health care provider may refer you to a mental health professional for further evaluation.  TREATMENT  Stress management is the recommended treatment for stress.The goals of stress management are reducing stressful life events and coping with stress in healthy ways.  Techniques for reducing stressful life events include the following:  Stress identification. Self-monitor for stress and identify what causes stress for you. These skills may help you to avoid some stressful events.  Time management. Set your priorities, keep a calendar of events, and  learn to say "no." These tools can help you avoid making too many commitments. Techniques for coping with stress include the following:  Rethinking the problem. Try to think realistically about stressful events rather than ignoring them or overreacting. Try to find the positives in a stressful situation rather than focusing on the negatives.  Exercise. Physical exercise can release both physical and  emotional tension. The key is to find a form of exercise you enjoy and do it regularly.  Relaxation techniques. These relax the body and mind. Examples include yoga, meditation, tai chi, biofeedback, deep breathing, progressive muscle relaxation, listening to music, being out in nature, journaling, and other hobbies. Again, the key is to find one or more that you enjoy and can do regularly.  Healthy lifestyle. Eat a balanced diet, get plenty of sleep, and do not smoke. Avoid using alcohol or drugs to relax.  Strong support network. Spend time with family, friends, or other people you enjoy being around.Express your feelings and talk things over with someone you trust. Counseling or talktherapy with a mental health professional may be helpful if you are having difficulty managing stress on your own. Medicine is typically not recommended for the treatment of stress.Talk to your health care provider if you think you need medicine for symptoms of stress. HOME CARE INSTRUCTIONS  Keep all follow-up visits as directed by your health care provider.  Take all medicines as directed by your health care provider. SEEK MEDICAL CARE IF:  Your symptoms get worse or you start having new symptoms.  You feel overwhelmed by your problems and can no longer manage them on your own. SEEK IMMEDIATE MEDICAL CARE IF:  You feel like hurting yourself or someone else. Document Released: 04/21/2001 Document Revised: 03/12/2014 Document Reviewed: 06/20/2013 [facility] Patient Information 2015 [place], [place]. This information is not intended to replace advice given to you by your health care provider. Make sure you discuss any questions you have with your health care provider.

## 2014-07-03 NOTE — Telephone Encounter (Signed)
Patient Information:  Caller Name: [place]  Phone: [phone]  Patient: [name], [name]  Gender: Female  DOB: 29-Jul-1951  Age: 63 Years  PCP: Alysia Penna [facility])  Office Follow Up:  Does the office need to follow up with this patient?: No  Instructions For The Office: N/A   Symptoms  Reason For Call & Symptoms: Diarrhea; approximately 8 episodes of watery diarrhea in the last 24 hours.  Reviewed Health History In EMR: Yes  Reviewed Medications In EMR: Yes  Reviewed Allergies In EMR: Yes  Reviewed Surgeries / Procedures: Yes  Date of Onset of Symptoms: 07/01/2014  Treatments Tried: anti-diarrhea medicine  Treatments Tried Worked: Yes  Guideline(s) Used:  Diarrhea  Disposition Per Guideline:   Go to Office Now  Reason For Disposition Reached:   Age > 60 years and has had > 6 diarrhea stools in past 24 hours  Advice Given:  N/A  Patient Will Follow Care Advice:  YES  Appointment Scheduled:  07/03/2014 13:30:00 Appointment Scheduled Provider:  Roxy [facility])

## 2014-07-03 NOTE — Telephone Encounter (Signed)
Noted  

## 2014-07-03 NOTE — Progress Notes (Signed)
Pre visit review using our clinic review tool, if applicable. No additional management support is needed unless otherwise documented below in the visit note. 

## 2014-07-06 ENCOUNTER — Ambulatory Visit (INDEPENDENT_AMBULATORY_CARE_PROVIDER_SITE_OTHER): Payer: BC Managed Care – PPO

## 2014-07-06 DIAGNOSIS — J309 Allergic rhinitis, unspecified: Secondary | ICD-10-CM

## 2014-07-13 ENCOUNTER — Ambulatory Visit (INDEPENDENT_AMBULATORY_CARE_PROVIDER_SITE_OTHER): Payer: BC Managed Care – PPO

## 2014-07-13 DIAGNOSIS — J309 Allergic rhinitis, unspecified: Secondary | ICD-10-CM

## 2014-07-20 ENCOUNTER — Ambulatory Visit (INDEPENDENT_AMBULATORY_CARE_PROVIDER_SITE_OTHER): Payer: BC Managed Care – PPO

## 2014-07-20 DIAGNOSIS — J309 Allergic rhinitis, unspecified: Secondary | ICD-10-CM

## 2014-07-27 ENCOUNTER — Ambulatory Visit (INDEPENDENT_AMBULATORY_CARE_PROVIDER_SITE_OTHER): Payer: BC Managed Care – PPO

## 2014-07-27 DIAGNOSIS — J309 Allergic rhinitis, unspecified: Secondary | ICD-10-CM

## 2014-07-31 ENCOUNTER — Ambulatory Visit: Payer: BC Managed Care – PPO | Admitting: Family Medicine

## 2014-08-03 ENCOUNTER — Ambulatory Visit (INDEPENDENT_AMBULATORY_CARE_PROVIDER_SITE_OTHER): Payer: BC Managed Care – PPO

## 2014-08-03 DIAGNOSIS — J309 Allergic rhinitis, unspecified: Secondary | ICD-10-CM

## 2014-08-10 ENCOUNTER — Ambulatory Visit (INDEPENDENT_AMBULATORY_CARE_PROVIDER_SITE_OTHER): Payer: BC Managed Care – PPO

## 2014-08-10 DIAGNOSIS — J309 Allergic rhinitis, unspecified: Secondary | ICD-10-CM

## 2014-08-17 ENCOUNTER — Ambulatory Visit (INDEPENDENT_AMBULATORY_CARE_PROVIDER_SITE_OTHER): Payer: BC Managed Care – PPO | Admitting: Family Medicine

## 2014-08-17 ENCOUNTER — Encounter: Payer: Self-pay | Admitting: Family Medicine

## 2014-08-17 ENCOUNTER — Ambulatory Visit (INDEPENDENT_AMBULATORY_CARE_PROVIDER_SITE_OTHER): Payer: BC Managed Care – PPO

## 2014-08-17 VITALS — BP 107/67 | HR 78 | Temp 98.4°F | Ht 61.0 in | Wt 190.0 lb

## 2014-08-17 DIAGNOSIS — J309 Allergic rhinitis, unspecified: Secondary | ICD-10-CM

## 2014-08-17 DIAGNOSIS — J0100 Acute maxillary sinusitis, unspecified: Secondary | ICD-10-CM

## 2014-08-17 MED ORDER — AZITHROMYCIN 250 MG PO TABS: ORAL_TABLET | ORAL | Status: DC

## 2014-08-17 MED ORDER — HYDROCODONE-HOMATROPINE 5-1.5 MG/5ML PO SYRP: 5.0000 mL | ORAL_SOLUTION | ORAL | Status: DC | PRN

## 2014-08-17 MED ORDER — METHYLPREDNISOLONE ACETATE 40 MG/ML IJ SUSP
120.0000 mg | Freq: Once | INTRAMUSCULAR | Status: AC
  Administered 2014-08-17: 120 mg via INTRAMUSCULAR

## 2014-08-17 NOTE — Progress Notes (Signed)
   Subjective:    Patient ID: [name], female    DOB: May 03, 1951, 63 y.o.   MRN: [id number]  HPI Here for one week of sinus pressure, PND, and coughing up green sputum. No SOB.    Review of Systems  Constitutional: Negative.   HENT: Positive for congestion, postnasal drip and sinus pressure.   Eyes: Negative.   Respiratory: Positive for cough.        Objective:   Physical Exam  Constitutional: She appears well-developed and well-nourished.  HENT:  Right Ear: External ear normal.  Left Ear: External ear normal.  Nose: Nose normal.  Mouth/Throat: Oropharynx is clear and moist.  Eyes: Conjunctivae are normal.  Pulmonary/Chest: Effort normal and breath sounds normal.  Lymphadenopathy:    She has no cervical adenopathy.          Assessment & Plan:  Given a steroid shot. Add Mucinex

## 2014-08-17 NOTE — Addendum Note (Signed)
Addended by: Aggie Hacker A on: 08/17/2014 02:51 PM   Modules accepted: Orders

## 2014-08-17 NOTE — Progress Notes (Signed)
Pre visit review using our clinic review tool, if applicable. No additional management support is needed unless otherwise documented below in the visit note. 

## 2014-08-24 ENCOUNTER — Ambulatory Visit (INDEPENDENT_AMBULATORY_CARE_PROVIDER_SITE_OTHER): Payer: BC Managed Care – PPO

## 2014-08-24 DIAGNOSIS — J309 Allergic rhinitis, unspecified: Secondary | ICD-10-CM

## 2014-08-31 ENCOUNTER — Ambulatory Visit (INDEPENDENT_AMBULATORY_CARE_PROVIDER_SITE_OTHER): Payer: BC Managed Care – PPO

## 2014-08-31 DIAGNOSIS — J309 Allergic rhinitis, unspecified: Secondary | ICD-10-CM

## 2014-09-06 ENCOUNTER — Encounter: Payer: Self-pay | Admitting: Internal Medicine

## 2014-09-06 ENCOUNTER — Ambulatory Visit (INDEPENDENT_AMBULATORY_CARE_PROVIDER_SITE_OTHER): Payer: BC Managed Care – PPO

## 2014-09-06 ENCOUNTER — Ambulatory Visit: Payer: BC Managed Care – PPO

## 2014-09-06 ENCOUNTER — Ambulatory Visit: Payer: BC Managed Care – PPO | Admitting: Internal Medicine

## 2014-09-06 VITALS — BP 104/70 | HR 73 | Ht 61.0 in | Wt 190.0 lb

## 2014-09-06 DIAGNOSIS — J309 Allergic rhinitis, unspecified: Secondary | ICD-10-CM

## 2014-09-06 DIAGNOSIS — J301 Allergic rhinitis due to pollen: Secondary | ICD-10-CM

## 2014-09-06 DIAGNOSIS — J4520 Mild intermittent asthma, uncomplicated: Secondary | ICD-10-CM

## 2014-09-06 NOTE — Progress Notes (Signed)
Subjective:    Patient ID: [name], female    DOB: November 02, 1951, 63 y.o.   MRN: [id number]  HPI 26 yoF never smoker, followed for allergy and asthma. Last here September 08, 2010 having had recent bronchitis. Mild winter with no major problems. In the last 3 weeks has blamed pollen for waking with head congestion. Blowing nose, ears stopped up.Chest ok and denies infection. Once upright in the morning and after claritin she is ok the rest of the day. Saline nasal spray helps. Decongestant sprays cause nose bleed.  Chest has done very well with Symbicort. At least 3 weeks since last used Proair rescue inhaler. Stress at work sometimes makes her tight.  09/03/11-  60 yoF never smoker, followed for allergy and asthma. For the last 2 days has had maxillary pressure and upper chest congestion. People at work have been sneezing and coughing. She has not checked her temperature but feels a little flushed. Nothing purulent or painful. Continues Symbicort and uses rescue inhaler about twice a week. No problems with allergy vaccine. Has had flu vaccine. Continues as stressed as she works on her divorce.  03/03/12- 30 yoF never smoker, followed for allergy and asthma. Went to urgent care for sinusitis 3 or 4 weeks ago. Improved but wants prescription for Flonase. Continues allergy vaccine. Asthma under good control with no wheezing and rare need for rescue inhaler. Persistent mild hacking cough. We discussed this because of her lisinopril and left it for her to decide whether it was a concern to discuss with her primary physician.  09/02/12- 60 yoF never smoker, followed for allergy and asthma. Still on allergy vaccine 1:10 GH and doing well. Denies any flare ups at this time. Had flu vaccine. Minor cough on lisinopril. She considers it is significant. Not much rhinitis this fall. CXR 03/03/12 IMPRESSION:  1. No radiographic evidence of acute cardiopulmonary disease.  Original Report Authenticated By:  Etheleen Mayhew, M.D.   01/30/13- 32 yoF never smoker, followed for allergy and asthma. ACUTE VISIT:chest congestion; wheezing, asthma attack last night.  Still on allergy vaccine 1:10 GH  03/03/13-61 yoF never smoker, followed for allergy and asthma. FOLLOWS FOR: still on allergy vaccine 1:10 GH and doing well; no flare ups at this time-using Loratadine daily and helping. tolerating spring pollens Resolved acute illness in late March. Don't identify problems from lisinopril-little cough  09/08/13- 56 yoF never smoker, followed for allergy and asthma. FOLLOWS FOR:  still on Allergy vaccine 1:10 GH and doing well She is noticing easy dyspnea on exertion walking up long chills, without chest pain. This may not be a real change. Occasional cough doesn't bother her. Mild dry throat tickle (lisinopril). Out of Flonase. PFT in 2009: Within normal limits-FEV1/FVC 0.83 with FEV1 2.64/130%.  01/26/14- 72 yoF never smoker, followed for allergy and asthma.  allergy vaccine 1:10 GH FOLLOWS OEU:MPNTIRWER on Sulfa for boil on body-requesting breathing treatment. Woke this AM with progressive cough, wheeze. No sore throat, fever. Started loratadine 3 weeks ago. Continues allergy vaccine 1:10 [place] inguinal boil almost resolved.   03/08/14- 63 yoF never smoker, followed for allergy and asthma. FOLLOWS FOR:  Allergy Vaccine 1:10 GH doing well.  No concerns today. Continues Flonase, loratadine. No wheezing on Symbicort. Spring pollen season has been well controlled.  09/06/14-  63 yoF never smoker, followed for allergy and asthma. FOLLOWS FOR: Allergy Vaccine 1:10 GH doing well. No concerns today.     ROS-see HPI Constitutional:   No-  weight loss, night sweats, fevers, chills, fatigue, lassitude. HEENT:   No-  headaches, difficulty swallowing, tooth/dental problems, sore throat,       No-  sneezing, itching, ear ache, nasal congestion, post nasal drip,  CV:  No-   chest pain, orthopnea, PND,  swelling in lower extremities, anasarca, dizziness, palpitations Resp: +shortness of breath with exertion or at rest.              No-   productive cough,  No- non-productive cough,  No- coughing up of blood.              No-   change in color of mucus.  No- wheezing.   Skin: No-   rash or lesions. GI:  No-   heartburn, indigestion, abdominal pain, nausea, vomiting, GU:  MS:  No-   joint pain or swelling.   Neuro-     nothing unusual Psych:  No- change in mood or affect.   Objective:  OBJ- Physical Exam General- Alert, Oriented, Affect-appropriate/ cheerful, Distress- none acute. obese Skin- rash-none, lesions- none, excoriation- none Lymphadenopathy- none Head- atraumatic            Eyes- Gross vision intact, PERRLA, conjunctivae and secretions clear            Ears- Hearing, canals-normal            Nose- +turbinate edema, no-Septal dev,polyps, erosion, perforation             Throat- Mallampati II-III , mucosa clear , drainage- none, tonsils- atrophic, +dentures Neck- flexible , trachea midline, no stridor , thyroid nl, carotid no bruit Chest - symmetrical excursion , unlabored           Heart/CV- RRR , no murmur , no gallop  , no rub, nl s1 s2                           - JVD- none , edema- none, stasis changes- none, varices- none           Lung- clear to P&A, , cough-none, wheeze-none , dullness-none, rub- none           Chest wall-  Abd-  Br/ Gen/ Rectal- Not done, not indicated Extrem- cyanosis- none, clubbing, none, atrophy- none, strength- nl Neuro- grossly intact to observation

## 2014-09-06 NOTE — Patient Instructions (Signed)
We can continue allergy vaccine 1:10 and current meds  Ple3aqse call as needed

## 2014-09-07 ENCOUNTER — Ambulatory Visit: Payer: BC Managed Care – PPO

## 2014-09-10 LAB — HM COLONOSCOPY

## 2014-09-12 ENCOUNTER — Encounter: Payer: Self-pay | Admitting: Internal Medicine

## 2014-09-14 ENCOUNTER — Ambulatory Visit (INDEPENDENT_AMBULATORY_CARE_PROVIDER_SITE_OTHER): Payer: BC Managed Care – PPO

## 2014-09-14 DIAGNOSIS — J309 Allergic rhinitis, unspecified: Secondary | ICD-10-CM

## 2014-09-21 ENCOUNTER — Ambulatory Visit (INDEPENDENT_AMBULATORY_CARE_PROVIDER_SITE_OTHER): Payer: BC Managed Care – PPO

## 2014-09-21 DIAGNOSIS — J309 Allergic rhinitis, unspecified: Secondary | ICD-10-CM

## 2014-09-28 ENCOUNTER — Ambulatory Visit (INDEPENDENT_AMBULATORY_CARE_PROVIDER_SITE_OTHER): Payer: BC Managed Care – PPO

## 2014-09-28 ENCOUNTER — Encounter: Payer: Self-pay | Admitting: Internal Medicine

## 2014-09-28 DIAGNOSIS — J309 Allergic rhinitis, unspecified: Secondary | ICD-10-CM

## 2014-10-05 ENCOUNTER — Ambulatory Visit: Payer: BC Managed Care – PPO

## 2014-10-19 ENCOUNTER — Ambulatory Visit (INDEPENDENT_AMBULATORY_CARE_PROVIDER_SITE_OTHER): Payer: BC Managed Care – PPO

## 2014-10-19 DIAGNOSIS — J309 Allergic rhinitis, unspecified: Secondary | ICD-10-CM

## 2014-10-23 ENCOUNTER — Ambulatory Visit (INDEPENDENT_AMBULATORY_CARE_PROVIDER_SITE_OTHER): Payer: BC Managed Care – PPO

## 2014-10-23 DIAGNOSIS — J309 Allergic rhinitis, unspecified: Secondary | ICD-10-CM

## 2014-10-26 ENCOUNTER — Ambulatory Visit (INDEPENDENT_AMBULATORY_CARE_PROVIDER_SITE_OTHER): Payer: BC Managed Care – PPO

## 2014-10-26 DIAGNOSIS — J309 Allergic rhinitis, unspecified: Secondary | ICD-10-CM

## 2014-10-31 ENCOUNTER — Ambulatory Visit (INDEPENDENT_AMBULATORY_CARE_PROVIDER_SITE_OTHER): Payer: BC Managed Care – PPO

## 2014-10-31 DIAGNOSIS — J309 Allergic rhinitis, unspecified: Secondary | ICD-10-CM

## 2014-11-07 ENCOUNTER — Ambulatory Visit: Payer: BC Managed Care – PPO

## 2014-11-16 ENCOUNTER — Ambulatory Visit (INDEPENDENT_AMBULATORY_CARE_PROVIDER_SITE_OTHER): Payer: BLUE CROSS/BLUE SHIELD

## 2014-11-16 DIAGNOSIS — J309 Allergic rhinitis, unspecified: Secondary | ICD-10-CM

## 2014-11-23 ENCOUNTER — Ambulatory Visit (INDEPENDENT_AMBULATORY_CARE_PROVIDER_SITE_OTHER): Payer: BLUE CROSS/BLUE SHIELD

## 2014-11-23 DIAGNOSIS — J309 Allergic rhinitis, unspecified: Secondary | ICD-10-CM

## 2014-11-29 ENCOUNTER — Ambulatory Visit (INDEPENDENT_AMBULATORY_CARE_PROVIDER_SITE_OTHER): Payer: BLUE CROSS/BLUE SHIELD

## 2014-11-29 DIAGNOSIS — J309 Allergic rhinitis, unspecified: Secondary | ICD-10-CM

## 2014-11-30 ENCOUNTER — Ambulatory Visit: Payer: BLUE CROSS/BLUE SHIELD

## 2014-12-07 ENCOUNTER — Ambulatory Visit (INDEPENDENT_AMBULATORY_CARE_PROVIDER_SITE_OTHER): Payer: BLUE CROSS/BLUE SHIELD

## 2014-12-07 DIAGNOSIS — J309 Allergic rhinitis, unspecified: Secondary | ICD-10-CM

## 2014-12-14 ENCOUNTER — Ambulatory Visit (INDEPENDENT_AMBULATORY_CARE_PROVIDER_SITE_OTHER): Payer: BLUE CROSS/BLUE SHIELD

## 2014-12-14 DIAGNOSIS — J309 Allergic rhinitis, unspecified: Secondary | ICD-10-CM

## 2014-12-21 ENCOUNTER — Ambulatory Visit (INDEPENDENT_AMBULATORY_CARE_PROVIDER_SITE_OTHER): Payer: BLUE CROSS/BLUE SHIELD

## 2014-12-21 DIAGNOSIS — J309 Allergic rhinitis, unspecified: Secondary | ICD-10-CM

## 2014-12-28 ENCOUNTER — Ambulatory Visit: Payer: BLUE CROSS/BLUE SHIELD

## 2015-01-11 ENCOUNTER — Ambulatory Visit (INDEPENDENT_AMBULATORY_CARE_PROVIDER_SITE_OTHER): Payer: BLUE CROSS/BLUE SHIELD

## 2015-01-11 DIAGNOSIS — J309 Allergic rhinitis, unspecified: Secondary | ICD-10-CM

## 2015-01-18 ENCOUNTER — Ambulatory Visit (INDEPENDENT_AMBULATORY_CARE_PROVIDER_SITE_OTHER): Payer: BLUE CROSS/BLUE SHIELD

## 2015-01-18 DIAGNOSIS — J309 Allergic rhinitis, unspecified: Secondary | ICD-10-CM

## 2015-01-21 ENCOUNTER — Encounter: Payer: Self-pay | Admitting: Internal Medicine

## 2015-01-25 ENCOUNTER — Ambulatory Visit (INDEPENDENT_AMBULATORY_CARE_PROVIDER_SITE_OTHER): Payer: BLUE CROSS/BLUE SHIELD

## 2015-01-25 DIAGNOSIS — J309 Allergic rhinitis, unspecified: Secondary | ICD-10-CM

## 2015-01-31 ENCOUNTER — Ambulatory Visit: Payer: BLUE CROSS/BLUE SHIELD

## 2015-02-08 ENCOUNTER — Ambulatory Visit (INDEPENDENT_AMBULATORY_CARE_PROVIDER_SITE_OTHER): Payer: BLUE CROSS/BLUE SHIELD

## 2015-02-08 DIAGNOSIS — J309 Allergic rhinitis, unspecified: Secondary | ICD-10-CM | POA: Diagnosis not present

## 2015-02-15 ENCOUNTER — Ambulatory Visit (INDEPENDENT_AMBULATORY_CARE_PROVIDER_SITE_OTHER): Payer: BLUE CROSS/BLUE SHIELD

## 2015-02-15 DIAGNOSIS — J309 Allergic rhinitis, unspecified: Secondary | ICD-10-CM | POA: Diagnosis not present

## 2015-02-22 ENCOUNTER — Ambulatory Visit: Payer: BLUE CROSS/BLUE SHIELD

## 2015-02-27 ENCOUNTER — Other Ambulatory Visit (INDEPENDENT_AMBULATORY_CARE_PROVIDER_SITE_OTHER): Payer: BLUE CROSS/BLUE SHIELD

## 2015-02-27 DIAGNOSIS — Z0000 Encounter for general adult medical examination without abnormal findings: Secondary | ICD-10-CM

## 2015-02-27 LAB — POCT URINALYSIS DIPSTICK
BILIRUBIN UA: NEGATIVE
Blood, UA: NEGATIVE
Glucose, UA: NEGATIVE
Ketones, UA: NEGATIVE
NITRITE UA: NEGATIVE
Protein, UA: NEGATIVE
Spec Grav, UA: 1.02
Urobilinogen, UA: 0.2
pH, UA: 6

## 2015-02-27 LAB — LIPID PANEL
CHOL/HDL RATIO: 4
CHOLESTEROL: 209 mg/dL — AB (ref 0–200)
HDL: 49.9 mg/dL (ref >39.00)
LDL Cholesterol: 119 mg/dL — ABNORMAL HIGH (ref 0–99)
NonHDL: 159.1
TRIGLYCERIDES: 199 mg/dL — AB (ref 0.0–149.0)
VLDL: 39.8 mg/dL (ref 0.0–40.0)

## 2015-02-27 LAB — HEPATIC FUNCTION PANEL
ALBUMIN: 4.1 g/dL (ref 3.5–5.2)
ALT: 13 U/L (ref 0–35)
AST: 16 U/L (ref 0–37)
Alkaline Phosphatase: 64 U/L (ref 39–117)
BILIRUBIN TOTAL: 0.5 mg/dL (ref 0.2–1.2)
Bilirubin, Direct: 0.1 mg/dL (ref 0.0–0.3)
Total Protein: 6.7 g/dL (ref 6.0–8.3)

## 2015-02-27 LAB — BASIC METABOLIC PANEL
BUN: 14 mg/dL (ref 6–23)
CALCIUM: 9.5 mg/dL (ref 8.4–10.5)
CO2: 28 mEq/L (ref 19–32)
CREATININE: 0.92 mg/dL (ref 0.40–1.20)
Chloride: 106 mEq/L (ref 96–112)
GFR: 65.74 mL/min (ref >60.00)
Glucose, Bld: 94 mg/dL (ref 70–99)
Potassium: 3.6 mEq/L (ref 3.5–5.1)
Sodium: 140 mEq/L (ref 135–145)

## 2015-02-27 LAB — CBC WITH DIFFERENTIAL/PLATELET
BASOS PCT: 0.5 % (ref 0.0–3.0)
Basophils Absolute: 0 10*3/uL (ref 0.0–0.1)
EOS PCT: 2.2 % (ref 0.0–5.0)
Eosinophils Absolute: 0.1 10*3/uL (ref 0.0–0.7)
HCT: 36.1 % (ref 36.0–46.0)
HEMOGLOBIN: 12.1 g/dL (ref 12.0–15.0)
Lymphocytes Relative: 24.3 % (ref 12.0–46.0)
Lymphs Abs: 1.4 10*3/uL (ref 0.7–4.0)
MCHC: 33.5 g/dL (ref 30.0–36.0)
MCV: 82.6 fl (ref 78.0–100.0)
MONOS PCT: 5.8 % (ref 3.0–12.0)
Monocytes Absolute: 0.3 10*3/uL (ref 0.1–1.0)
NEUTROS ABS: 4 10*3/uL (ref 1.4–7.7)
NEUTROS PCT: 67.2 % (ref 43.0–77.0)
Platelets: 222 10*3/uL (ref 150.0–400.0)
RBC: 4.37 Mil/uL (ref 3.87–5.11)
RDW: 15.7 % — ABNORMAL HIGH (ref 11.5–15.5)
WBC: 5.9 10*3/uL (ref 4.0–10.5)

## 2015-02-27 LAB — TSH: TSH: 2.26 u[IU]/mL (ref 0.35–4.50)

## 2015-03-01 ENCOUNTER — Ambulatory Visit (INDEPENDENT_AMBULATORY_CARE_PROVIDER_SITE_OTHER): Payer: BLUE CROSS/BLUE SHIELD

## 2015-03-01 DIAGNOSIS — J309 Allergic rhinitis, unspecified: Secondary | ICD-10-CM

## 2015-03-05 ENCOUNTER — Ambulatory Visit (INDEPENDENT_AMBULATORY_CARE_PROVIDER_SITE_OTHER): Payer: BLUE CROSS/BLUE SHIELD | Admitting: Family Medicine

## 2015-03-05 ENCOUNTER — Encounter: Payer: Self-pay | Admitting: Family Medicine

## 2015-03-05 VITALS — BP 109/65 | HR 67 | Temp 98.4°F | Ht 61.0 in | Wt 182.0 lb

## 2015-03-05 DIAGNOSIS — Z0000 Encounter for general adult medical examination without abnormal findings: Secondary | ICD-10-CM | POA: Diagnosis not present

## 2015-03-05 MED ORDER — HYDROCORTISONE ACETATE 25 MG RE SUPP: 25.0000 mg | Freq: Every day | RECTAL | Status: DC

## 2015-03-05 MED ORDER — POTASSIUM CHLORIDE CRYS ER 20 MEQ PO TBCR: 20.0000 meq | EXTENDED_RELEASE_TABLET | Freq: Every day | ORAL | Status: DC

## 2015-03-05 MED ORDER — SERTRALINE HCL 100 MG PO TABS: 150.0000 mg | ORAL_TABLET | Freq: Every day | ORAL | Status: DC

## 2015-03-05 MED ORDER — OMEPRAZOLE 20 MG PO CPDR: 20.0000 mg | DELAYED_RELEASE_CAPSULE | Freq: Every day | ORAL | Status: DC

## 2015-03-05 MED ORDER — LISINOPRIL-HYDROCHLOROTHIAZIDE 20-12.5 MG PO TABS: ORAL_TABLET | ORAL | Status: DC

## 2015-03-05 NOTE — Progress Notes (Signed)
   Subjective:    Patient ID: [name], female    DOB: [birth year], 64 y.o.   MRN: [id number]  HPI 64 yr old female for a cpx. She feels well. She had a full GYN exam recently and she will see Dr. Annamaria Boots in a few weeks.    Review of Systems  Constitutional: Negative.   HENT: Negative.   Eyes: Negative.   Respiratory: Negative.   Cardiovascular: Negative.   Gastrointestinal: Negative.   Genitourinary: Negative for dysuria, urgency, frequency, hematuria, flank pain, decreased urine volume, enuresis, difficulty urinating, pelvic pain and dyspareunia.  Musculoskeletal: Negative.   Skin: Negative.   Neurological: Negative.   Psychiatric/Behavioral: Negative.        Objective:   Physical Exam  Constitutional: She is oriented to person, place, and time. She appears well-developed and well-nourished. No distress.  HENT:  Head: Normocephalic and atraumatic.  Right Ear: External ear normal.  Left Ear: External ear normal.  Nose: Nose normal.  Mouth/Throat: Oropharynx is clear and moist. No oropharyngeal exudate.  Eyes: Conjunctivae and EOM are normal. Pupils are equal, round, and reactive to light. No scleral icterus.  Neck: Normal range of motion. Neck supple. No JVD present. No thyromegaly present.  Cardiovascular: Normal rate, regular rhythm, normal heart sounds and intact distal pulses.  Exam reveals no gallop and no friction rub.   No murmur heard. EKG normal   Pulmonary/Chest: Effort normal and breath sounds normal. No respiratory distress. She has no wheezes. She has no rales. She exhibits no tenderness.  Abdominal: Soft. Bowel sounds are normal. She exhibits no distension and no mass. There is no tenderness. There is no rebound and no guarding.  Musculoskeletal: Normal range of motion. She exhibits no edema or tenderness.  Lymphadenopathy:    She has no cervical adenopathy.  Neurological: She is alert and oriented to person, place, and time. She has normal reflexes. No  cranial nerve deficit. She exhibits normal muscle tone. Coordination normal.  Skin: Skin is warm and dry. No rash noted. No erythema.  Psychiatric: She has a normal mood and affect. Her behavior is normal. Judgment and thought content normal.          Assessment & Plan:  Well exam. We discussed diet and exercise, and I encouraged her to lose some weight

## 2015-03-05 NOTE — Progress Notes (Signed)
Pre visit review using our clinic review tool, if applicable. No additional management support is needed unless otherwise documented below in the visit note. 

## 2015-03-06 ENCOUNTER — Telehealth: Payer: Self-pay

## 2015-03-06 MED ORDER — HYDROCORTISONE ACETATE 25 MG RE SUPP: 25.0000 mg | Freq: Every day | RECTAL | Status: DC

## 2015-03-06 MED ORDER — POTASSIUM CHLORIDE CRYS ER 20 MEQ PO TBCR: 20.0000 meq | EXTENDED_RELEASE_TABLET | Freq: Every day | ORAL | Status: DC

## 2015-03-06 MED ORDER — SERTRALINE HCL 100 MG PO TABS: 150.0000 mg | ORAL_TABLET | Freq: Every day | ORAL | Status: DC

## 2015-03-06 MED ORDER — OMEPRAZOLE 20 MG PO CPDR: 20.0000 mg | DELAYED_RELEASE_CAPSULE | Freq: Every day | ORAL | Status: DC

## 2015-03-06 MED ORDER — LISINOPRIL-HYDROCHLOROTHIAZIDE 20-12.5 MG PO TABS: ORAL_TABLET | ORAL | Status: DC

## 2015-03-06 NOTE — Telephone Encounter (Signed)
Resent 5 Rx refills today that was ordered on yesterdays date due to [place] being down yesterday

## 2015-03-06 NOTE — Addendum Note (Signed)
Addended by: Margarito Liner on: 03/06/2015 04:37 PM   Modules accepted: Orders

## 2015-03-08 ENCOUNTER — Ambulatory Visit: Payer: BLUE CROSS/BLUE SHIELD | Admitting: Internal Medicine

## 2015-03-08 ENCOUNTER — Encounter: Payer: Self-pay | Admitting: Internal Medicine

## 2015-03-08 ENCOUNTER — Ambulatory Visit (INDEPENDENT_AMBULATORY_CARE_PROVIDER_SITE_OTHER): Payer: BLUE CROSS/BLUE SHIELD

## 2015-03-08 VITALS — BP 112/76 | HR 63 | Ht 61.0 in | Wt 182.2 lb

## 2015-03-08 DIAGNOSIS — J309 Allergic rhinitis, unspecified: Secondary | ICD-10-CM

## 2015-03-08 DIAGNOSIS — J301 Allergic rhinitis due to pollen: Secondary | ICD-10-CM

## 2015-03-08 MED ORDER — FLUTICASONE PROPIONATE 50 MCG/ACT NA SUSP: NASAL | Status: DC

## 2015-03-08 MED ORDER — BUDESONIDE-FORMOTEROL FUMARATE 80-4.5 MCG/ACT IN AERO: INHALATION_SPRAY | RESPIRATORY_TRACT | Status: DC

## 2015-03-08 MED ORDER — ALBUTEROL SULFATE HFA 108 (90 BASE) MCG/ACT IN AERS: 2.0000 | INHALATION_SPRAY | RESPIRATORY_TRACT | Status: DC | PRN

## 2015-03-08 NOTE — Patient Instructions (Signed)
Refill scripts sent for albuterol inhaler, Symbicort and Flonase  We can continue your allergy shots at 1:10 [place]  Please call as needed

## 2015-03-08 NOTE — Progress Notes (Signed)
Subjective:    Patient ID: [name], female    DOB: 03-08-1951, 64 y.o.   MRN: [id number]  HPI 3 yoF never smoker, followed for allergy and asthma. Last here September 08, 2010 having had recent bronchitis. Mild winter with no major problems. In the last 3 weeks has blamed pollen for waking with head congestion. Blowing nose, ears stopped up.Chest ok and denies infection. Once upright in the morning and after claritin she is ok the rest of the day. Saline nasal spray helps. Decongestant sprays cause nose bleed.  Chest has done very well with Symbicort. At least 3 weeks since last used Proair rescue inhaler. Stress at work sometimes makes her tight.  09/03/11-  60 yoF never smoker, followed for allergy and asthma. For the last 2 days has had maxillary pressure and upper chest congestion. People at work have been sneezing and coughing. She has not checked her temperature but feels a little flushed. Nothing purulent or painful. Continues Symbicort and uses rescue inhaler about twice a week. No problems with allergy vaccine. Has had flu vaccine. Continues as stressed as she works on her divorce.  03/03/12- 76 yoF never smoker, followed for allergy and asthma. Went to urgent care for sinusitis 3 or 4 weeks ago. Improved but wants prescription for Flonase. Continues allergy vaccine. Asthma under good control with no wheezing and rare need for rescue inhaler. Persistent mild hacking cough. We discussed this because of her lisinopril and left it for her to decide whether it was a concern to discuss with her primary physician.  09/02/12- 60 yoF never smoker, followed for allergy and asthma. Still on allergy vaccine 1:10 [facility] and doing well. Denies any flare ups at this time. Had flu vaccine. Minor cough on lisinopril. She considers it is significant. Not much rhinitis this fall. CXR 03/03/12 IMPRESSION:  1. No radiographic evidence of acute cardiopulmonary disease.  Original Report Authenticated By:  Etheleen Mayhew, M.D.   01/30/13- 63 yoF never smoker, followed for allergy and asthma. ACUTE VISIT:chest congestion; wheezing, asthma attack last night.  Still on allergy vaccine 1:10 [facility]  03/03/13-61 yoF never smoker, followed for allergy and asthma. FOLLOWS FOR: still on allergy vaccine 1:10 [facility] and doing well; no flare ups at this time-using Loratadine daily and helping. tolerating spring pollens Resolved acute illness in late March. Don't identify problems from lisinopril-little cough  09/08/13- 25 yoF never smoker, followed for allergy and asthma. FOLLOWS FOR:  still on Allergy vaccine 1:10 [facility] and doing well She is noticing easy dyspnea on exertion walking up long chills, without chest pain. This may not be a real change. Occasional cough doesn't bother her. Mild dry throat tickle (lisinopril). Out of Flonase. PFT in 2009: Within normal limits-FEV1/FVC 0.83 with FEV1 2.64/130%.  01/26/14- 68 yoF never smoker, followed for allergy and asthma.  allergy vaccine 1:10 GH FOLLOWS NOI:[id number] on Sulfa for boil on body-requesting breathing treatment. Woke this AM with progressive cough, wheeze. No sore throat, fever. Started loratadine 3 weeks ago. Continues allergy vaccine 1:10 [place] [facility] inguinal boil almost resolved.   03/08/14- 63 yoF never smoker, followed for allergy and asthma. FOLLOWS FOR:  Allergy Vaccine 1:10 GH doing well.  No concerns today. Continues Flonase, loratadine. No wheezing on Symbicort. Spring pollen season has been well controlled.  09/06/14-  63 yoF never smoker, followed for allergy and asthma. FOLLOWS FOR: still on vaccine 1:10 GH and doing well.     ROS-see HPI Constitutional:   No-   weight  loss, night sweats, fevers, chills, fatigue, lassitude. HEENT:   No-  headaches, difficulty swallowing, tooth/dental problems, sore throat,       No-  sneezing, itching, ear ache, nasal congestion, post nasal drip,  CV:  No-   chest pain, orthopnea, PND, swelling in lower  extremities, anasarca, dizziness, palpitations Resp: +shortness of breath with exertion or at rest.              No-   productive cough,  No- non-productive cough,  No- coughing up of blood.              No-   change in color of mucus.  No- wheezing.   Skin: No-   rash or lesions. GI:  No-   heartburn, indigestion, abdominal pain, nausea, vomiting, GU:  MS:  No-   joint pain or swelling.   Neuro-     nothing unusual Psych:  No- change in mood or affect.   Objective:  OBJ- Physical Exam General- Alert, Oriented, Affect-appropriate/ cheerful, Distress- none acute. obese Skin- rash-none, lesions- none, excoriation- none Lymphadenopathy- none Head- atraumatic            Eyes- Gross vision intact, PERRLA, conjunctivae and secretions clear            Ears- Hearing, canals-normal            Nose- +turbinate edema, no-Septal dev,polyps, erosion, perforation             Throat- Mallampati II-III , mucosa clear , drainage- none, tonsils- atrophic, +dentures Neck- flexible , trachea midline, no stridor , thyroid nl, carotid no bruit Chest - symmetrical excursion , unlabored           Heart/CV- RRR , no murmur , no gallop  , no rub, nl s1 s2                           - JVD- none , edema- none, stasis changes- none, varices- none           Lung- clear to P&A, , cough-none, wheeze-none , dullness-none, rub- none           Chest wall-  Abd-  Br/ Gen/ Rectal- Not done, not indicated Extrem- cyanosis- none, clubbing, none, atrophy- none, strength- nl Neuro- grossly intact to observation

## 2015-03-14 ENCOUNTER — Telehealth: Payer: Self-pay | Admitting: Internal Medicine

## 2015-03-14 NOTE — Telephone Encounter (Signed)
Called and spoke to [facility]. Proventil is not covered by insurance. Rx changed to Ventolin. Nothing further needed.

## 2015-03-15 ENCOUNTER — Telehealth: Payer: Self-pay | Admitting: Internal Medicine

## 2015-03-15 ENCOUNTER — Ambulatory Visit (INDEPENDENT_AMBULATORY_CARE_PROVIDER_SITE_OTHER): Payer: BLUE CROSS/BLUE SHIELD

## 2015-03-15 DIAGNOSIS — J309 Allergic rhinitis, unspecified: Secondary | ICD-10-CM | POA: Diagnosis not present

## 2015-03-15 NOTE — Telephone Encounter (Signed)
Called and spoke to pt. Pt stated she still has not received her symbicort, albuterol, or flonase from [facility]. Called and spoke to [place] [facility]. Richardson Landry stated they are going to be shipping the order out either today or tomorrow- pt should be receiving the order early next week.   LMTCB for pt to inform her.

## 2015-03-15 NOTE — Telephone Encounter (Signed)
Pt returned call  Informed her about her med being shipped.[name]

## 2015-03-22 ENCOUNTER — Ambulatory Visit (INDEPENDENT_AMBULATORY_CARE_PROVIDER_SITE_OTHER): Payer: BLUE CROSS/BLUE SHIELD

## 2015-03-22 DIAGNOSIS — J309 Allergic rhinitis, unspecified: Secondary | ICD-10-CM

## 2015-03-26 ENCOUNTER — Ambulatory Visit (INDEPENDENT_AMBULATORY_CARE_PROVIDER_SITE_OTHER): Payer: BLUE CROSS/BLUE SHIELD

## 2015-03-26 DIAGNOSIS — J309 Allergic rhinitis, unspecified: Secondary | ICD-10-CM

## 2015-03-29 ENCOUNTER — Ambulatory Visit (INDEPENDENT_AMBULATORY_CARE_PROVIDER_SITE_OTHER): Payer: BLUE CROSS/BLUE SHIELD

## 2015-03-29 DIAGNOSIS — J309 Allergic rhinitis, unspecified: Secondary | ICD-10-CM

## 2015-04-05 ENCOUNTER — Ambulatory Visit: Payer: BLUE CROSS/BLUE SHIELD

## 2015-04-12 ENCOUNTER — Ambulatory Visit (INDEPENDENT_AMBULATORY_CARE_PROVIDER_SITE_OTHER): Payer: BLUE CROSS/BLUE SHIELD

## 2015-04-12 DIAGNOSIS — J309 Allergic rhinitis, unspecified: Secondary | ICD-10-CM

## 2015-04-19 ENCOUNTER — Ambulatory Visit (INDEPENDENT_AMBULATORY_CARE_PROVIDER_SITE_OTHER): Payer: BLUE CROSS/BLUE SHIELD

## 2015-04-19 DIAGNOSIS — J309 Allergic rhinitis, unspecified: Secondary | ICD-10-CM

## 2015-04-26 ENCOUNTER — Ambulatory Visit: Payer: BLUE CROSS/BLUE SHIELD

## 2015-05-03 ENCOUNTER — Ambulatory Visit (INDEPENDENT_AMBULATORY_CARE_PROVIDER_SITE_OTHER): Payer: BLUE CROSS/BLUE SHIELD

## 2015-05-03 DIAGNOSIS — J309 Allergic rhinitis, unspecified: Secondary | ICD-10-CM | POA: Diagnosis not present

## 2015-05-07 ENCOUNTER — Ambulatory Visit (INDEPENDENT_AMBULATORY_CARE_PROVIDER_SITE_OTHER): Payer: BLUE CROSS/BLUE SHIELD

## 2015-05-07 DIAGNOSIS — J309 Allergic rhinitis, unspecified: Secondary | ICD-10-CM | POA: Diagnosis not present

## 2015-05-14 ENCOUNTER — Ambulatory Visit: Payer: BLUE CROSS/BLUE SHIELD

## 2015-05-20 ENCOUNTER — Other Ambulatory Visit: Payer: Self-pay | Admitting: Internal Medicine

## 2015-05-21 ENCOUNTER — Encounter: Payer: Self-pay | Admitting: Internal Medicine

## 2015-05-22 ENCOUNTER — Ambulatory Visit (INDEPENDENT_AMBULATORY_CARE_PROVIDER_SITE_OTHER): Payer: BLUE CROSS/BLUE SHIELD

## 2015-05-22 DIAGNOSIS — J309 Allergic rhinitis, unspecified: Secondary | ICD-10-CM

## 2015-05-29 ENCOUNTER — Ambulatory Visit: Payer: BLUE CROSS/BLUE SHIELD

## 2015-06-04 ENCOUNTER — Ambulatory Visit (INDEPENDENT_AMBULATORY_CARE_PROVIDER_SITE_OTHER): Payer: BLUE CROSS/BLUE SHIELD

## 2015-06-04 DIAGNOSIS — J309 Allergic rhinitis, unspecified: Secondary | ICD-10-CM | POA: Diagnosis not present

## 2015-06-11 ENCOUNTER — Ambulatory Visit: Payer: BLUE CROSS/BLUE SHIELD

## 2015-06-14 ENCOUNTER — Ambulatory Visit (INDEPENDENT_AMBULATORY_CARE_PROVIDER_SITE_OTHER): Payer: BLUE CROSS/BLUE SHIELD

## 2015-06-14 DIAGNOSIS — J309 Allergic rhinitis, unspecified: Secondary | ICD-10-CM

## 2015-06-21 ENCOUNTER — Ambulatory Visit: Payer: BLUE CROSS/BLUE SHIELD

## 2015-06-28 ENCOUNTER — Encounter: Payer: Self-pay | Admitting: Internal Medicine

## 2015-07-05 ENCOUNTER — Ambulatory Visit (INDEPENDENT_AMBULATORY_CARE_PROVIDER_SITE_OTHER): Payer: BLUE CROSS/BLUE SHIELD

## 2015-07-05 DIAGNOSIS — J309 Allergic rhinitis, unspecified: Secondary | ICD-10-CM | POA: Diagnosis not present

## 2015-07-12 ENCOUNTER — Ambulatory Visit (INDEPENDENT_AMBULATORY_CARE_PROVIDER_SITE_OTHER): Payer: BLUE CROSS/BLUE SHIELD

## 2015-07-12 DIAGNOSIS — J309 Allergic rhinitis, unspecified: Secondary | ICD-10-CM | POA: Diagnosis not present

## 2015-07-19 ENCOUNTER — Ambulatory Visit (INDEPENDENT_AMBULATORY_CARE_PROVIDER_SITE_OTHER): Payer: BLUE CROSS/BLUE SHIELD

## 2015-07-19 DIAGNOSIS — J309 Allergic rhinitis, unspecified: Secondary | ICD-10-CM | POA: Diagnosis not present

## 2015-07-26 ENCOUNTER — Ambulatory Visit (INDEPENDENT_AMBULATORY_CARE_PROVIDER_SITE_OTHER): Payer: BLUE CROSS/BLUE SHIELD

## 2015-07-26 DIAGNOSIS — J309 Allergic rhinitis, unspecified: Secondary | ICD-10-CM | POA: Diagnosis not present

## 2015-08-02 ENCOUNTER — Ambulatory Visit (INDEPENDENT_AMBULATORY_CARE_PROVIDER_SITE_OTHER): Payer: BLUE CROSS/BLUE SHIELD

## 2015-08-02 DIAGNOSIS — J309 Allergic rhinitis, unspecified: Secondary | ICD-10-CM

## 2015-08-09 ENCOUNTER — Ambulatory Visit (INDEPENDENT_AMBULATORY_CARE_PROVIDER_SITE_OTHER): Payer: BLUE CROSS/BLUE SHIELD

## 2015-08-09 DIAGNOSIS — J309 Allergic rhinitis, unspecified: Secondary | ICD-10-CM

## 2015-08-12 ENCOUNTER — Telehealth: Payer: Self-pay | Admitting: Internal Medicine

## 2015-08-12 NOTE — Telephone Encounter (Signed)
Allergy Serum Extract Date Mixed: 08/12/2015 Vial: A Strength: 1:10 Here/Mail/Pick Up: Here Mixed By: Desmond Dike, CMA

## 2015-08-13 ENCOUNTER — Ambulatory Visit (INDEPENDENT_AMBULATORY_CARE_PROVIDER_SITE_OTHER): Payer: BLUE CROSS/BLUE SHIELD

## 2015-08-13 DIAGNOSIS — J309 Allergic rhinitis, unspecified: Secondary | ICD-10-CM | POA: Diagnosis not present

## 2015-08-14 ENCOUNTER — Ambulatory Visit (INDEPENDENT_AMBULATORY_CARE_PROVIDER_SITE_OTHER): Payer: BLUE CROSS/BLUE SHIELD | Admitting: Family Medicine

## 2015-08-14 ENCOUNTER — Encounter: Payer: Self-pay | Admitting: Family Medicine

## 2015-08-14 VITALS — BP 116/73 | HR 64 | Temp 98.5°F | Ht 61.0 in | Wt 185.0 lb

## 2015-08-14 DIAGNOSIS — L739 Follicular disorder, unspecified: Secondary | ICD-10-CM | POA: Diagnosis not present

## 2015-08-14 MED ORDER — CEPHALEXIN 500 MG PO CAPS: 500.0000 mg | ORAL_CAPSULE | Freq: Three times a day (TID) | ORAL | Status: DC

## 2015-08-14 NOTE — Progress Notes (Signed)
Pre visit review using our clinic review tool, if applicable. No additional management support is needed unless otherwise documented below in the visit note. 

## 2015-08-14 NOTE — Progress Notes (Signed)
   Subjective:    Patient ID: [name], female    DOB: 1951-08-26, 64 y.o.   MRN: [id number]  HPI Here for 2 small tender bumps that came up in the pelvic area 2 days ago. She notes that she spent a week at the beach last week.    Review of Systems  Constitutional: Negative.   Gastrointestinal: Negative.   Genitourinary: Negative.   Skin: Positive for rash.       Objective:   Physical Exam  Constitutional: She appears well-developed and well-nourished. No distress.  Abdominal: Soft. Bowel sounds are normal. She exhibits no distension and no mass. There is no tenderness. There is no rebound and no guarding.  Skin:  She has 2 small tender pustular lesions, one on the pubic area and one on the left labia majora          Assessment & Plan:  Folliculitis, treat with Keflex.

## 2015-08-16 ENCOUNTER — Ambulatory Visit (INDEPENDENT_AMBULATORY_CARE_PROVIDER_SITE_OTHER): Payer: BLUE CROSS/BLUE SHIELD

## 2015-08-16 DIAGNOSIS — J309 Allergic rhinitis, unspecified: Secondary | ICD-10-CM

## 2015-08-22 ENCOUNTER — Ambulatory Visit (INDEPENDENT_AMBULATORY_CARE_PROVIDER_SITE_OTHER): Payer: BLUE CROSS/BLUE SHIELD

## 2015-08-22 DIAGNOSIS — J309 Allergic rhinitis, unspecified: Secondary | ICD-10-CM

## 2015-08-30 ENCOUNTER — Ambulatory Visit (INDEPENDENT_AMBULATORY_CARE_PROVIDER_SITE_OTHER): Payer: BLUE CROSS/BLUE SHIELD

## 2015-08-30 DIAGNOSIS — J309 Allergic rhinitis, unspecified: Secondary | ICD-10-CM

## 2015-09-06 ENCOUNTER — Ambulatory Visit: Payer: BLUE CROSS/BLUE SHIELD

## 2015-09-13 ENCOUNTER — Ambulatory Visit (INDEPENDENT_AMBULATORY_CARE_PROVIDER_SITE_OTHER): Payer: BLUE CROSS/BLUE SHIELD

## 2015-09-13 DIAGNOSIS — J309 Allergic rhinitis, unspecified: Secondary | ICD-10-CM

## 2015-09-20 ENCOUNTER — Ambulatory Visit (INDEPENDENT_AMBULATORY_CARE_PROVIDER_SITE_OTHER): Payer: BLUE CROSS/BLUE SHIELD

## 2015-09-20 DIAGNOSIS — J309 Allergic rhinitis, unspecified: Secondary | ICD-10-CM | POA: Diagnosis not present

## 2015-09-24 ENCOUNTER — Ambulatory Visit: Payer: BLUE CROSS/BLUE SHIELD

## 2015-09-27 ENCOUNTER — Ambulatory Visit: Payer: BLUE CROSS/BLUE SHIELD

## 2015-09-30 ENCOUNTER — Encounter: Payer: Self-pay | Admitting: Family Medicine

## 2015-09-30 ENCOUNTER — Ambulatory Visit (INDEPENDENT_AMBULATORY_CARE_PROVIDER_SITE_OTHER): Payer: BLUE CROSS/BLUE SHIELD | Admitting: Family Medicine

## 2015-09-30 VITALS — BP 112/59 | HR 85 | Temp 98.5°F | Ht 61.0 in | Wt 189.0 lb

## 2015-09-30 DIAGNOSIS — J209 Acute bronchitis, unspecified: Secondary | ICD-10-CM

## 2015-09-30 MED ORDER — HYDROCODONE-HOMATROPINE 5-1.5 MG/5ML PO SYRP: 5.0000 mL | ORAL_SOLUTION | ORAL | Status: DC | PRN

## 2015-09-30 MED ORDER — AZITHROMYCIN 250 MG PO TABS: ORAL_TABLET | ORAL | Status: DC

## 2015-09-30 NOTE — Progress Notes (Signed)
   Subjective:    Patient ID: [name], female    DOB: [birth year], 64 y.o.   MRN: [id number]  HPI Here for 3 days of chest congestion and coughing up green sputum. No fever.    Review of Systems  Constitutional: Negative.   HENT: Positive for congestion and postnasal drip.   Eyes: Negative.   Respiratory: Positive for cough and chest tightness. Negative for shortness of breath.   Cardiovascular: Negative.        Objective:   Physical Exam  Constitutional: She appears well-developed and well-nourished.  HENT:  Right Ear: External ear normal.  Left Ear: External ear normal.  Nose: Nose normal.  Mouth/Throat: Oropharynx is clear and moist.  Eyes: Conjunctivae are normal.  Neck: No thyromegaly present.  Cardiovascular: Normal rate, regular rhythm, normal heart sounds and intact distal pulses.   Pulmonary/Chest: Effort normal. No respiratory distress. She has no wheezes. She has no rales.  Scattered rhonchi   Lymphadenopathy:    She has no cervical adenopathy.          Assessment & Plan:  Bronchitis, treat with a Zpack.

## 2015-09-30 NOTE — Progress Notes (Signed)
Pre visit review using our clinic review tool, if applicable. No additional management support is needed unless otherwise documented below in the visit note. 

## 2015-10-06 ENCOUNTER — Ambulatory Visit (INDEPENDENT_AMBULATORY_CARE_PROVIDER_SITE_OTHER): Payer: BLUE CROSS/BLUE SHIELD

## 2015-10-06 ENCOUNTER — Ambulatory Visit (INDEPENDENT_AMBULATORY_CARE_PROVIDER_SITE_OTHER): Payer: BLUE CROSS/BLUE SHIELD | Admitting: Emergency Medicine

## 2015-10-06 VITALS — BP 118/78 | HR 72 | Temp 98.4°F | Resp 16 | Ht 61.5 in | Wt 192.0 lb

## 2015-10-06 DIAGNOSIS — R059 Cough, unspecified: Secondary | ICD-10-CM

## 2015-10-06 DIAGNOSIS — R05 Cough: Secondary | ICD-10-CM

## 2015-10-06 LAB — POCT CBC
GRANULOCYTE PERCENT: 65.2 % (ref 37–80)
HCT, POC: 34.6 % — AB (ref 37.7–47.9)
Hemoglobin: 11.6 g/dL — AB (ref 12.2–16.2)
Lymph, poc: 1.8 (ref 0.6–3.4)
MCH, POC: 28.8 pg (ref 27–31.2)
MCHC: 33.5 g/dL (ref 31.8–35.4)
MCV: 86.1 fL (ref 80–97)
MID (cbc): 0.4 (ref 0–0.9)
MPV: 6.9 fL (ref 0–99.8)
POC Granulocyte: 4.1 (ref 2–6.9)
POC LYMPH PERCENT: 28.9 %L (ref 10–50)
POC MID %: 5.9 % (ref 0–12)
Platelet Count, POC: 213 10*3/uL (ref 142–424)
RBC: 4.02 M/uL — AB (ref 4.04–5.48)
RDW, POC: 15 %
WBC: 6.3 10*3/uL (ref 4.6–10.2)

## 2015-10-06 MED ORDER — PREDNISONE 10 MG PO TABS
ORAL_TABLET | ORAL | Status: DC
Start: 2015-10-06 — End: 2015-11-05

## 2015-10-06 NOTE — Patient Instructions (Signed)

## 2015-10-06 NOTE — Progress Notes (Signed)
This chart was scribed for Arlyss Queen, MD by Moises Blood, Medical Scribe. This patient was seen in Room 2 and the patient's care was started 11:15 AM.  Chief Complaint:  Chief Complaint  Patient presents with  . Cough    seen last week, diagnosed with bronchitis, not feeling any better    HPI: [name] is a 64 y.o. female who reports to [facility] today complaining of cough. She was seen by Dr. Sarajane Jews last week and was diagnosed with bronchitis and she's not feeling any better. She was given hydrocodone and mucinex without relief. Her ears are stopping up. She states that mucinex would usually clear it up, but this time without relief. She uses symbicort inhaler for asthma once in the morning. She rarely uses her ProAir. She sees Dr. Annamaria Boots for allergist. She denies wheezing with this. She had heartburn last night, but more based on what she ate, which was chicken cooked on teppanyaki. She gets similar symptoms twice a year.   She works for [employer] on [address].   Past Medical History  Diagnosis Date  . Allergy   . GERD (gastroesophageal reflux disease)   . Hypertension   . Depression   . Gynecological examination     sees Dr. Paula Compton  . Asthma     sees Dr. Baird Lyons  . Diverticulosis   . Colon polyp 2007  . Hyperlipemia    Past Surgical History  Procedure Laterality Date  . Carpal tunnel release Right 1980  . Colonoscopy  06-20-14    per Dr. Olevia Perches, adenomatous polyp,repeat in 5 yrs   . Dilation and curettage of uterus  2005   Social History   Social History  . Marital Status: Divorced    Spouse Name: N/A  . Number of Children: 2  . Years of Education: N/A   Occupational History  .  [employer]   Social History Main Topics  . Smoking status: Never Smoker   . Smokeless tobacco: Never Used  . Alcohol Use: 0.0 oz/week    0 Standard drinks or equivalent per week     Comment: occ  . Drug Use: No  . Sexual Activity: Not Asked    Other Topics Concern  . None   Social History Narrative   Daily caffeine    Family History  Problem Relation Age of Onset  . Colon cancer Mother 49  . Colon cancer Father 2  . Liver cancer Father   . Diabetes Sister   . Throat cancer Brother   . Colon cancer Maternal Aunt     not sure age of onset  . Colon cancer Maternal Aunt     not sure age of onset  . Pancreatic cancer Neg Hx   . Rectal cancer Neg Hx   . Stomach cancer Neg Hx    Allergies  Allergen Reactions  . Doxycycline     REACTION: jittery   Prior to Admission medications   Medication Sig Start Date End Date Taking? Authorizing Provider  albuterol (PROVENTIL HFA;VENTOLIN HFA) 108 (90 BASE) MCG/ACT inhaler Inhale 2 puffs into the lungs as needed for wheezing or shortness of breath. 03/08/15  Yes Deneise Lever, MD  aspirin 81 MG tablet Take 81 mg by mouth daily.     Yes Historical Provider, MD  budesonide-formoterol (SYMBICORT) 80-4.5 MCG/ACT inhaler 2 puffs then rinse mouth, twice daily maintenance inhaler 03/08/15  Yes Deneise Lever, MD  Flaxseed, Linseed, (FLAX SEED  OIL) 1000 MG CAPS Take 1 capsule by mouth daily.     Yes Historical Provider, MD  fluticasone (FLONASE) 50 MCG/ACT nasal spray 1-2 sprays each nostril once or twice daily 03/08/15  Yes Clinton D Young, MD  HYDROcodone-homatropine (HYDROMET) 5-1.5 MG/5ML syrup Take 5 mLs by mouth every 4 (four) hours as needed. 09/30/15  Yes Laurey Morale, MD  hydrocortisone (ANUCORT-HC) 25 MG suppository Place 1 suppository (25 mg total) rectally at bedtime. 03/06/15  Yes Laurey Morale, MD  lisinopril-hydrochlorothiazide (PRINZIDE,ZESTORETIC) 20-12.5 MG per tablet TAKE ONE TABLET BY MOUTH EVERY DAY 03/06/15  Yes Laurey Morale, MD  omeprazole (PRILOSEC) 20 MG capsule Take 1 capsule (20 mg total) by mouth daily. 03/06/15  Yes Laurey Morale, MD  potassium chloride SA (K-DUR,KLOR-CON) 20 MEQ tablet Take 1 tablet (20 mEq total) by mouth daily. 03/06/15  Yes Laurey Morale, MD   PROAIR HFA 108 (90 BASE) MCG/ACT inhaler INHALE 2 PUFFS INTO THE LUNGS EVERY 6 HOURS AS NEEDED FOR WHEEZING. 05/20/15  Yes Deneise Lever, MD  pyridOXINE (VITAMIN B-6) 100 MG tablet Take 100 mg by mouth daily.     Yes Historical Provider, MD  sertraline (ZOLOFT) 100 MG tablet Take 1.5 tablets (150 mg total) by mouth daily. 03/06/15 04/07/16 Yes Laurey Morale, MD  azithromycin [facility]) 250 MG tablet As directed Patient not taking: Reported on 10/06/2015 09/30/15   Laurey Morale, MD  cephALEXin (KEFLEX) 500 MG capsule Take 1 capsule (500 mg total) by mouth 3 (three) times daily. Patient not taking: Reported on 09/30/2015 08/14/15   Laurey Morale, MD  NON FORMULARY Allergy vaccine. Once a week     Historical Provider, MD  NONFORMULARY OR COMPOUNDED ITEM Allergy Vaccine 1:10 Given at [facility]    Historical Provider, MD     ROS:  Constitutional: negative for chills, fever, night sweats, weight changes, or fatigue  HEENT: negative for vision changes, hearing loss, congestion, rhinorrhea, ST, epistaxis, or sinus pressure Cardiovascular: negative for chest pain or palpitations Respiratory: negative for hemoptysis, wheezing, shortness of breath; positive for cough Abdominal: negative for abdominal pain, nausea, vomiting, diarrhea, or constipation Dermatological: negative for rash Neurologic: negative for headache, dizziness, or syncope All other systems reviewed and are otherwise negative with the exception to those above and in the HPI.  PHYSICAL EXAM: Filed Vitals:   10/06/15 1101  BP: 118/78  Pulse: 72  Temp: 98.4 F (36.9 C)  Resp: 16   Body mass index is 35.7 kg/(m^2).   General: Alert, no acute distress HEENT:  Normocephalic, atraumatic, oropharynx patent. Eye: Juliette Mangle [facility] Cardiovascular:  Regular rate and rhythm, no rubs murmurs or gallops.  No Carotid bruits, radial pulse intact. No pedal edema.  Respiratory: Clear to auscultation bilaterally. Occasional rhonchi,  and wheeze; no rales Abdominal: No organomegaly, abdomen is soft and non-tender, positive bowel sounds. No masses. Musculoskeletal: Gait intact. No edema, tenderness Skin: No rashes. Neurologic: Facial musculature symmetric. Psychiatric: Patient acts appropriately throughout our interaction.  Lymphatic: No cervical or submandibular lymphadenopathy Genitourinary/Anorectal: No acute findings   LABS: Results for orders placed or performed in visit on 10/06/15  POCT CBC  Result Value Ref Range   WBC 6.3 4.6 - 10.2 K/uL   Lymph, poc 1.8 0.6 - 3.4   POC LYMPH PERCENT 28.9 10 - 50 %L   MID (cbc) 0.4 0 - 0.9   POC MID % 5.9 0 - 12 %M   POC Granulocyte 4.1 2 - 6.9  Granulocyte percent 65.2 37 - 80 %G   RBC 4.02 (A) 4.04 - 5.48 M/uL   Hemoglobin 11.6 (A) 12.2 - 16.2 g/dL   HCT, POC 34.6 (A) 37.7 - 47.9 %   MCV 86.1 80 - 97 fL   MCH, POC 28.8 27 - 31.2 pg   MCHC 33.5 31.8 - 35.4 g/dL   RDW, POC 15.0 %   Platelet Count, POC 213 142 - 424 K/uL   MPV 6.9 0 - 99.8 fL      EKG/XRAY:   Primary read interpreted by Dr. Everlene Farrier at [facility]: chest xray: clear   ASSESSMENT/PLAN: I suspect this is allergic. She also had a significant smoke exposure. Will treat with prednisone in a taper. She will also increase her Symbicort take twice a day. Hemoglobin is also low at 11.6 and I advised her to follow this up with her PCP.I personally performed the services described in this documentation, which was scribed in my presence. The recorded information has been reviewed and is accurate.  By signing my name below, I, Moises Blood, attest that this documentation has been prepared under the direction and in the presence of Arlyss Queen, MD. Electronically Signed: Moises Blood, [place]. 10/06/2015 , 11:15 AM .    Johney [place] sideeffects, risk and benefits, and alternatives of medications d/w patient. Patient is aware that all medications have potential sideeffects and we are unable to predict every sideeffect or  drug-drug interaction that may occur.  Arlyss Queen MD 10/06/2015 11:15 AM

## 2015-10-11 ENCOUNTER — Ambulatory Visit (INDEPENDENT_AMBULATORY_CARE_PROVIDER_SITE_OTHER): Payer: BLUE CROSS/BLUE SHIELD

## 2015-10-11 DIAGNOSIS — J309 Allergic rhinitis, unspecified: Secondary | ICD-10-CM

## 2015-10-18 ENCOUNTER — Ambulatory Visit (INDEPENDENT_AMBULATORY_CARE_PROVIDER_SITE_OTHER): Payer: BLUE CROSS/BLUE SHIELD

## 2015-10-18 DIAGNOSIS — J309 Allergic rhinitis, unspecified: Secondary | ICD-10-CM

## 2015-10-25 ENCOUNTER — Ambulatory Visit (INDEPENDENT_AMBULATORY_CARE_PROVIDER_SITE_OTHER): Payer: BLUE CROSS/BLUE SHIELD

## 2015-10-25 DIAGNOSIS — J309 Allergic rhinitis, unspecified: Secondary | ICD-10-CM

## 2015-11-01 ENCOUNTER — Ambulatory Visit: Payer: BLUE CROSS/BLUE SHIELD

## 2015-11-05 ENCOUNTER — Ambulatory Visit (INDEPENDENT_AMBULATORY_CARE_PROVIDER_SITE_OTHER): Payer: BLUE CROSS/BLUE SHIELD | Admitting: Family Medicine

## 2015-11-05 VITALS — BP 130/76 | HR 71 | Temp 98.1°F | Resp 16 | Ht 61.5 in | Wt 190.5 lb

## 2015-11-05 DIAGNOSIS — J3489 Other specified disorders of nose and nasal sinuses: Secondary | ICD-10-CM | POA: Diagnosis not present

## 2015-11-05 MED ORDER — PREDNISONE 20 MG PO TABS: ORAL_TABLET | ORAL | Status: DC

## 2015-11-05 NOTE — Patient Instructions (Signed)
Use the prednisone course as directed for your sinus pressure and wheezing.   If you are not feeling improved in the next 2-3 days please give me a call- Sooner if worse.

## 2015-11-05 NOTE — Progress Notes (Signed)
[facility] [address] [place] [address] [phone]  Date:  11/05/2015   Name:  [name]   DOB:  24-Jun-1951   MRN:  [id number]  PCP:  Laurey Morale, MD    Chief Complaint: Facial Pain   History of Present Illness:  [name] is a 64 y.o. very pleasant female patient who presents with the following:  Here today with a sinus concern.  She was ill last month with bronchitis- she was treated with abx and predniosne. This got better but she now has sinus pressure for about 2 days.   Her ears feel stuffy.  She tried some mucinex and this did help some.  She has a bit of a cough but this is normal for her as she is on lisinopril She has not noted a fever but did feel cold today This is going around her office  She is not using her hydromet but does have some at home if she needs it.  She takes symbicort daily and albuterol as needed  She does have asthma and sees Dr. Annamaria Boots.  She has noted a bit of wheezing with this illness   Patient Active Problem List   Diagnosis Date Noted  . Allergic rhinitis due to pollen 01/12/2011  . ACUTE BRONCHITIS 12/05/2008  . DEPRESSION 11/20/2008  . APHTHOUS ULCERS 03/15/2008  . DIZZINESS 02/08/2008  . ARM NUMBNESS 02/08/2008  . HEADACHE 02/08/2008  . OTHER ACUTE SINUSITIS 10/25/2007  . CONCUSSION, <30 MIN LOC 08/08/2007  . DEPRESSION, ACUTE 06/15/2007  . HYPERTENSION 05/23/2007  . Mild intermittent asthma 05/23/2007  . GERD 05/23/2007    Past Medical History  Diagnosis Date  . Allergy   . GERD (gastroesophageal reflux disease)   . Hypertension   . Depression   . Gynecological examination     sees Dr. Paula Compton  . Asthma     sees Dr. Baird Lyons  . Diverticulosis   . Colon polyp 2007  . Hyperlipemia     Past Surgical History  Procedure Laterality Date  . Carpal tunnel release Right 1980  . Colonoscopy  06-20-14    per Dr. Olevia Perches, adenomatous polyp,repeat in 5 yrs   . Dilation  and curettage of uterus  2005    Social History  Substance Use Topics  . Smoking status: Never Smoker   . Smokeless tobacco: Never Used  . Alcohol Use: 0.0 oz/week    0 Standard drinks or equivalent per week     Comment: occ    Family History  Problem Relation Age of Onset  . Colon cancer Mother 12  . Colon cancer Father 3  . Liver cancer Father   . Diabetes Sister   . Throat cancer Brother   . Colon cancer Maternal Aunt     not sure age of onset  . Colon cancer Maternal Aunt     not sure age of onset  . Pancreatic cancer Neg Hx   . Rectal cancer Neg Hx   . Stomach cancer Neg Hx     Allergies  Allergen Reactions  . Doxycycline     REACTION: jittery    Medication list has been reviewed and updated.  Current Outpatient Prescriptions on File Prior to Visit  Medication Sig Dispense Refill  . albuterol (PROVENTIL HFA;VENTOLIN HFA) 108 (90 BASE) MCG/ACT inhaler Inhale 2 puffs into the lungs as needed for wheezing or shortness of breath. 3 Inhaler 3  . aspirin 81 MG tablet Take  81 mg by mouth daily.      Marland Kitchen azithromycin (ZITHROMAX) 250 MG tablet As directed 6 tablet 0  . budesonide-formoterol (SYMBICORT) 80-4.5 MCG/ACT inhaler 2 puffs then rinse mouth, twice daily maintenance inhaler 3 Inhaler 3  . Flaxseed, Linseed, (FLAX SEED OIL) 1000 MG CAPS Take 1 capsule by mouth daily.      . fluticasone (FLONASE) 50 MCG/ACT nasal spray 1-2 sprays each nostril once or twice daily 48 g 3  . HYDROcodone-homatropine (HYDROMET) 5-1.5 MG/5ML syrup Take 5 mLs by mouth every 4 (four) hours as needed. 240 mL 0  . hydrocortisone (ANUCORT-HC) 25 MG suppository Place 1 suppository (25 mg total) rectally at bedtime. 12 suppository 3  . lisinopril-hydrochlorothiazide (PRINZIDE,ZESTORETIC) 20-12.5 MG per tablet TAKE ONE TABLET BY MOUTH EVERY DAY 90 tablet 3  . NON FORMULARY Allergy vaccine. Once a week     . NONFORMULARY OR COMPOUNDED ITEM Allergy Vaccine 1:10 Given at [facility]    .  omeprazole (PRILOSEC) 20 MG capsule Take 1 capsule (20 mg total) by mouth daily. 90 capsule 3  . potassium chloride SA (K-DUR,KLOR-CON) 20 MEQ tablet Take 1 tablet (20 mEq total) by mouth daily. 90 tablet 3  . predniSONE (DELTASONE) 10 MG tablet Take 4 a day for 3 days 3 a day for 3 days 2 a day for 3 days one a day for 3 days 30 tablet 0  . PROAIR HFA 108 (90 BASE) MCG/ACT inhaler INHALE 2 PUFFS INTO THE LUNGS EVERY 6 HOURS AS NEEDED FOR WHEEZING. 25.5 g 2  . pyridOXINE (VITAMIN B-6) 100 MG tablet Take 100 mg by mouth daily.      . sertraline (ZOLOFT) 100 MG tablet Take 1.5 tablets (150 mg total) by mouth daily. 135 tablet 3   No current facility-administered medications on file prior to visit.    Review of Systems:  As per HPI- otherwise negative.   Physical Examination: Filed Vitals:   11/05/15 1944  BP: 130/76  Pulse: 71  Temp: 98.1 F (36.7 C)  Resp: 16   Filed Vitals:   11/05/15 1944  Height: 5' 1.5" (1.562 m)  Weight: 190 lb 8 oz (86.41 kg)   Body mass index is 35.42 kg/(m^2). Ideal Body Weight: Weight in (lb) to have BMI = 25: 134.2  GEN: WDWN, NAD, Non-toxic, A & O x 3, overweight, looks well HEENT: Atraumatic, Normocephalic. Neck supple. No masses, No LAD.  Bilateral TM wnl, oropharynx normal.  PEERL,EOMI.  Nasal cavity is inflamed and she notes tenderness to percussion over the sinuses  Ears and Nose: No external deformity. CV: RRR, No M/G/R. No JVD. No thrill. No extra heart sounds. PULM: CTA B, no wheezes, crackles, rhonchi. No retractions. No resp. distress. No accessory muscle use. EXTR: No c/c/e NEURO Normal gait.  PSYCH: Normally interactive. Conversant. Not depressed or anxious appearing.  Calm demeanor.    Assessment and Plan: Sinus pressure - Plan: predniSONE (DELTASONE) 20 MG tablet  At this time will not use an abx.  However she is bothered by sinus pressure and pain- will use a short course of prednisone and she can continue mucinex, she has hycodan  to use if needed She will let me know if not feeling better in the next few days   Signed Lamar Blinks, MD

## 2015-11-08 ENCOUNTER — Ambulatory Visit (INDEPENDENT_AMBULATORY_CARE_PROVIDER_SITE_OTHER): Payer: BLUE CROSS/BLUE SHIELD

## 2015-11-08 DIAGNOSIS — J309 Allergic rhinitis, unspecified: Secondary | ICD-10-CM | POA: Diagnosis not present

## 2015-11-15 ENCOUNTER — Ambulatory Visit (INDEPENDENT_AMBULATORY_CARE_PROVIDER_SITE_OTHER): Payer: BLUE CROSS/BLUE SHIELD

## 2015-11-15 DIAGNOSIS — J309 Allergic rhinitis, unspecified: Secondary | ICD-10-CM

## 2015-11-22 ENCOUNTER — Ambulatory Visit (INDEPENDENT_AMBULATORY_CARE_PROVIDER_SITE_OTHER): Payer: BLUE CROSS/BLUE SHIELD

## 2015-11-22 DIAGNOSIS — J309 Allergic rhinitis, unspecified: Secondary | ICD-10-CM | POA: Diagnosis not present

## 2015-11-29 ENCOUNTER — Ambulatory Visit (INDEPENDENT_AMBULATORY_CARE_PROVIDER_SITE_OTHER): Payer: BLUE CROSS/BLUE SHIELD

## 2015-11-29 DIAGNOSIS — J309 Allergic rhinitis, unspecified: Secondary | ICD-10-CM

## 2015-12-06 ENCOUNTER — Ambulatory Visit (INDEPENDENT_AMBULATORY_CARE_PROVIDER_SITE_OTHER): Payer: BLUE CROSS/BLUE SHIELD

## 2015-12-06 DIAGNOSIS — J309 Allergic rhinitis, unspecified: Secondary | ICD-10-CM

## 2015-12-13 ENCOUNTER — Ambulatory Visit (INDEPENDENT_AMBULATORY_CARE_PROVIDER_SITE_OTHER): Payer: BLUE CROSS/BLUE SHIELD

## 2015-12-13 DIAGNOSIS — J309 Allergic rhinitis, unspecified: Secondary | ICD-10-CM | POA: Diagnosis not present

## 2015-12-20 ENCOUNTER — Ambulatory Visit: Payer: BLUE CROSS/BLUE SHIELD

## 2015-12-20 ENCOUNTER — Ambulatory Visit (INDEPENDENT_AMBULATORY_CARE_PROVIDER_SITE_OTHER): Payer: BLUE CROSS/BLUE SHIELD

## 2015-12-20 DIAGNOSIS — J309 Allergic rhinitis, unspecified: Secondary | ICD-10-CM

## 2015-12-23 ENCOUNTER — Telehealth: Payer: Self-pay | Admitting: Internal Medicine

## 2015-12-23 DIAGNOSIS — J309 Allergic rhinitis, unspecified: Secondary | ICD-10-CM | POA: Diagnosis not present

## 2015-12-23 NOTE — Telephone Encounter (Signed)
Allergy Serum Extract Date Mixed: 12/23/15 Vial: 1 Strength: 1:10 Here/Mail/Pick Up: here Mixed By: tbs Last OV: 03/08/15 Pending OV: 03/09/16

## 2015-12-26 ENCOUNTER — Ambulatory Visit (INDEPENDENT_AMBULATORY_CARE_PROVIDER_SITE_OTHER): Payer: BLUE CROSS/BLUE SHIELD | Admitting: Family Medicine

## 2015-12-26 ENCOUNTER — Encounter: Payer: Self-pay | Admitting: Family Medicine

## 2015-12-26 VITALS — BP 116/70 | HR 57 | Temp 98.2°F | Ht 61.5 in | Wt 198.0 lb

## 2015-12-26 DIAGNOSIS — J209 Acute bronchitis, unspecified: Secondary | ICD-10-CM | POA: Diagnosis not present

## 2015-12-26 MED ORDER — AZITHROMYCIN 250 MG PO TABS: ORAL_TABLET | ORAL | Status: DC

## 2015-12-26 MED ORDER — METHYLPREDNISOLONE ACETATE 80 MG/ML IJ SUSP
120.0000 mg | Freq: Once | INTRAMUSCULAR | Status: AC
  Administered 2015-12-26: 120 mg via INTRAMUSCULAR

## 2015-12-26 MED ORDER — HYDROCODONE-HOMATROPINE 5-1.5 MG/5ML PO SYRP: 5.0000 mL | ORAL_SOLUTION | ORAL | Status: DC | PRN

## 2015-12-26 NOTE — Addendum Note (Signed)
Addended by: Aggie Hacker A on: 12/26/2015 10:53 AM   Modules accepted: Orders

## 2015-12-26 NOTE — Progress Notes (Signed)
   Subjective:    Patient ID: TEMICA NATH, female    DOB: 09/25/1951, 65 y.o.   MRN: [id number]  HPI Here for one week of PND, chest tightness and coughing up yellow sputum. No fever. Using her inhalers.    Review of Systems  Constitutional: Negative.   HENT: Positive for congestion, postnasal drip and voice change. Negative for sinus pressure and sore throat.   Eyes: Negative.   Respiratory: Positive for cough, chest tightness, shortness of breath and wheezing.   Cardiovascular: Negative.        Objective:   Physical Exam  Constitutional: She appears well-developed and well-nourished.  HENT:  Right Ear: External ear normal.  Left Ear: External ear normal.  Nose: Nose normal.  Mouth/Throat: Oropharynx is clear and moist.  Eyes: Conjunctivae are normal.  Neck: No thyromegaly present.  Cardiovascular: Normal rate, regular rhythm, normal heart sounds and intact distal pulses.   Pulmonary/Chest: Effort normal. No respiratory distress. She has no rales.  Scattered wheezes and rhonchi, voice is hoarse   Lymphadenopathy:    She has no cervical adenopathy.          Assessment & Plan:  Bronchitis, treat with a steroid shot and a Zpack. Written out of work today

## 2015-12-26 NOTE — Progress Notes (Signed)
Pre visit review using our clinic review tool, if applicable. No additional management support is needed unless otherwise documented below in the visit note. 

## 2015-12-27 ENCOUNTER — Ambulatory Visit (INDEPENDENT_AMBULATORY_CARE_PROVIDER_SITE_OTHER): Payer: BLUE CROSS/BLUE SHIELD

## 2015-12-27 DIAGNOSIS — J309 Allergic rhinitis, unspecified: Secondary | ICD-10-CM | POA: Diagnosis not present

## 2015-12-30 LAB — HM PAP SMEAR: HM Pap smear: POSITIVE

## 2015-12-30 LAB — HM MAMMOGRAPHY

## 2016-01-03 ENCOUNTER — Ambulatory Visit (INDEPENDENT_AMBULATORY_CARE_PROVIDER_SITE_OTHER): Payer: BLUE CROSS/BLUE SHIELD

## 2016-01-03 ENCOUNTER — Ambulatory Visit: Payer: BLUE CROSS/BLUE SHIELD

## 2016-01-03 DIAGNOSIS — J309 Allergic rhinitis, unspecified: Secondary | ICD-10-CM

## 2016-01-09 ENCOUNTER — Ambulatory Visit (INDEPENDENT_AMBULATORY_CARE_PROVIDER_SITE_OTHER): Payer: BLUE CROSS/BLUE SHIELD | Admitting: *Deleted

## 2016-01-09 DIAGNOSIS — J309 Allergic rhinitis, unspecified: Secondary | ICD-10-CM

## 2016-01-10 ENCOUNTER — Ambulatory Visit: Payer: BLUE CROSS/BLUE SHIELD

## 2016-01-17 ENCOUNTER — Ambulatory Visit (INDEPENDENT_AMBULATORY_CARE_PROVIDER_SITE_OTHER): Payer: BLUE CROSS/BLUE SHIELD | Admitting: *Deleted

## 2016-01-17 DIAGNOSIS — J309 Allergic rhinitis, unspecified: Secondary | ICD-10-CM

## 2016-01-23 ENCOUNTER — Ambulatory Visit (INDEPENDENT_AMBULATORY_CARE_PROVIDER_SITE_OTHER): Payer: BLUE CROSS/BLUE SHIELD | Admitting: *Deleted

## 2016-01-23 DIAGNOSIS — J309 Allergic rhinitis, unspecified: Secondary | ICD-10-CM | POA: Diagnosis not present

## 2016-01-24 ENCOUNTER — Ambulatory Visit: Payer: BLUE CROSS/BLUE SHIELD

## 2016-01-31 ENCOUNTER — Ambulatory Visit (INDEPENDENT_AMBULATORY_CARE_PROVIDER_SITE_OTHER): Payer: BLUE CROSS/BLUE SHIELD

## 2016-01-31 DIAGNOSIS — J309 Allergic rhinitis, unspecified: Secondary | ICD-10-CM | POA: Diagnosis not present

## 2016-02-06 ENCOUNTER — Ambulatory Visit (INDEPENDENT_AMBULATORY_CARE_PROVIDER_SITE_OTHER): Payer: BLUE CROSS/BLUE SHIELD | Admitting: *Deleted

## 2016-02-06 DIAGNOSIS — J309 Allergic rhinitis, unspecified: Secondary | ICD-10-CM

## 2016-02-07 ENCOUNTER — Ambulatory Visit: Payer: BLUE CROSS/BLUE SHIELD

## 2016-02-14 ENCOUNTER — Ambulatory Visit (INDEPENDENT_AMBULATORY_CARE_PROVIDER_SITE_OTHER): Payer: BLUE CROSS/BLUE SHIELD

## 2016-02-14 DIAGNOSIS — J309 Allergic rhinitis, unspecified: Secondary | ICD-10-CM | POA: Diagnosis not present

## 2016-02-20 ENCOUNTER — Ambulatory Visit: Payer: BLUE CROSS/BLUE SHIELD

## 2016-02-21 ENCOUNTER — Ambulatory Visit: Payer: BLUE CROSS/BLUE SHIELD

## 2016-02-27 ENCOUNTER — Ambulatory Visit (INDEPENDENT_AMBULATORY_CARE_PROVIDER_SITE_OTHER): Payer: BLUE CROSS/BLUE SHIELD | Admitting: *Deleted

## 2016-02-27 DIAGNOSIS — J309 Allergic rhinitis, unspecified: Secondary | ICD-10-CM | POA: Diagnosis not present

## 2016-02-28 ENCOUNTER — Ambulatory Visit: Payer: BLUE CROSS/BLUE SHIELD

## 2016-03-04 ENCOUNTER — Other Ambulatory Visit (INDEPENDENT_AMBULATORY_CARE_PROVIDER_SITE_OTHER): Payer: BLUE CROSS/BLUE SHIELD

## 2016-03-04 DIAGNOSIS — Z0000 Encounter for general adult medical examination without abnormal findings: Secondary | ICD-10-CM | POA: Diagnosis not present

## 2016-03-04 LAB — CBC WITH DIFFERENTIAL/PLATELET
BASOS ABS: 0 10*3/uL (ref 0.0–0.1)
Basophils Relative: 0.4 % (ref 0.0–3.0)
EOS ABS: 0.2 10*3/uL (ref 0.0–0.7)
Eosinophils Relative: 2.1 % (ref 0.0–5.0)
HCT: 36.3 % (ref 36.0–46.0)
HEMOGLOBIN: 12.3 g/dL (ref 12.0–15.0)
LYMPHS ABS: 1.5 10*3/uL (ref 0.7–4.0)
Lymphocytes Relative: 19 % (ref 12.0–46.0)
MCHC: 33.8 g/dL (ref 30.0–36.0)
MCV: 86.6 fl (ref 78.0–100.0)
MONO ABS: 0.4 10*3/uL (ref 0.1–1.0)
Monocytes Relative: 5 % (ref 3.0–12.0)
NEUTROS PCT: 73.5 % (ref 43.0–77.0)
Neutro Abs: 5.7 10*3/uL (ref 1.4–7.7)
Platelets: 230 10*3/uL (ref 150.0–400.0)
RBC: 4.19 Mil/uL (ref 3.87–5.11)
RDW: 14.7 % (ref 11.5–15.5)
WBC: 7.8 10*3/uL (ref 4.0–10.5)

## 2016-03-04 LAB — BASIC METABOLIC PANEL
BUN: 11 mg/dL (ref 6–23)
CALCIUM: 9.4 mg/dL (ref 8.4–10.5)
CO2: 29 mEq/L (ref 19–32)
CREATININE: 0.83 mg/dL (ref 0.40–1.20)
Chloride: 105 mEq/L (ref 96–112)
GFR: 73.79 mL/min (ref >60.00)
GLUCOSE: 111 mg/dL — AB (ref 70–99)
POTASSIUM: 3.9 meq/L (ref 3.5–5.1)
Sodium: 141 mEq/L (ref 135–145)

## 2016-03-04 LAB — LIPID PANEL
CHOL/HDL RATIO: 4
CHOLESTEROL: 213 mg/dL — AB (ref 0–200)
HDL: 51 mg/dL (ref >39.00)
LDL CALC: 127 mg/dL — AB (ref 0–99)
NonHDL: 161.7
Triglycerides: 172 mg/dL — ABNORMAL HIGH (ref 0.0–149.0)
VLDL: 34.4 mg/dL (ref 0.0–40.0)

## 2016-03-04 LAB — HEPATIC FUNCTION PANEL
ALK PHOS: 67 U/L (ref 39–117)
ALT: 16 U/L (ref 0–35)
AST: 15 U/L (ref 0–37)
Albumin: 4 g/dL (ref 3.5–5.2)
BILIRUBIN DIRECT: 0.1 mg/dL (ref 0.0–0.3)
BILIRUBIN TOTAL: 0.5 mg/dL (ref 0.2–1.2)
Total Protein: 6.8 g/dL (ref 6.0–8.3)

## 2016-03-04 LAB — POC URINALSYSI DIPSTICK (AUTOMATED)
BILIRUBIN UA: NEGATIVE
Blood, UA: NEGATIVE
Glucose, UA: NEGATIVE
KETONES UA: NEGATIVE
Nitrite, UA: NEGATIVE
PH UA: 7
SPEC GRAV UA: 1.02
Urobilinogen, UA: 1

## 2016-03-04 LAB — TSH: TSH: 1.43 u[IU]/mL (ref 0.35–4.50)

## 2016-03-06 ENCOUNTER — Ambulatory Visit (INDEPENDENT_AMBULATORY_CARE_PROVIDER_SITE_OTHER): Payer: BLUE CROSS/BLUE SHIELD

## 2016-03-06 DIAGNOSIS — J309 Allergic rhinitis, unspecified: Secondary | ICD-10-CM | POA: Diagnosis not present

## 2016-03-09 ENCOUNTER — Ambulatory Visit (INDEPENDENT_AMBULATORY_CARE_PROVIDER_SITE_OTHER): Payer: BLUE CROSS/BLUE SHIELD | Admitting: *Deleted

## 2016-03-09 ENCOUNTER — Ambulatory Visit (INDEPENDENT_AMBULATORY_CARE_PROVIDER_SITE_OTHER): Payer: BLUE CROSS/BLUE SHIELD | Admitting: Internal Medicine

## 2016-03-09 ENCOUNTER — Encounter: Payer: Self-pay | Admitting: Internal Medicine

## 2016-03-09 VITALS — BP 110/70 | HR 62 | Ht 61.0 in | Wt 202.4 lb

## 2016-03-09 DIAGNOSIS — J301 Allergic rhinitis due to pollen: Secondary | ICD-10-CM

## 2016-03-09 DIAGNOSIS — J309 Allergic rhinitis, unspecified: Secondary | ICD-10-CM

## 2016-03-09 DIAGNOSIS — J4520 Mild intermittent asthma, uncomplicated: Secondary | ICD-10-CM

## 2016-03-09 MED ORDER — BUDESONIDE-FORMOTEROL FUMARATE 80-4.5 MCG/ACT IN AERO: INHALATION_SPRAY | RESPIRATORY_TRACT | Status: DC

## 2016-03-09 MED ORDER — FLUTICASONE PROPIONATE 50 MCG/ACT NA SUSP: NASAL | Status: DC

## 2016-03-09 MED ORDER — ALBUTEROL SULFATE HFA 108 (90 BASE) MCG/ACT IN AERS: INHALATION_SPRAY | RESPIRATORY_TRACT | Status: AC

## 2016-03-09 NOTE — Patient Instructions (Signed)
Med refills sent  We can continue allergy vaccine through this year as discussed

## 2016-03-09 NOTE — Progress Notes (Signed)
Subjective:    Patient ID: [name], female    DOB: Jul 31, 1951, 65 y.o.   MRN: [id number]  HPI 69 yoF never smoker, followed for allergy and asthma. Last here September 08, 2010 having had recent bronchitis. Mild winter with no major problems. In the last 3 weeks has blamed pollen for waking with head congestion. Blowing nose, ears stopped up.Chest ok and denies infection. Once upright in the morning and after claritin she is ok the rest of the day. Saline nasal spray helps. Decongestant sprays cause nose bleed.  Chest has done very well with Symbicort. At least 3 weeks since last used Proair rescue inhaler. Stress at work sometimes makes her tight.  09/03/11-  60 yoF never smoker, followed for allergy and asthma. For the last 2 days has had maxillary pressure and upper chest congestion. People at work have been sneezing and coughing. She has not checked her temperature but feels a little flushed. Nothing purulent or painful. Continues Symbicort and uses rescue inhaler about twice a week. No problems with allergy vaccine. Has had flu vaccine. Continues as stressed as she works on her divorce.  03/03/12- 80 yoF never smoker, followed for allergy and asthma. Went to urgent care for sinusitis 3 or 4 weeks ago. Improved but wants prescription for Flonase. Continues allergy vaccine. Asthma under good control with no wheezing and rare need for rescue inhaler. Persistent mild hacking cough. We discussed this because of her lisinopril and left it for her to decide whether it was a concern to discuss with her primary physician.  09/02/12- 60 yoF never smoker, followed for allergy and asthma. Still on allergy vaccine 1:10 GH and doing well. Denies any flare ups at this time. Had flu vaccine. Minor cough on lisinopril. She considers it is significant. Not much rhinitis this fall. CXR 03/03/12 IMPRESSION:  1. No radiographic evidence of acute cardiopulmonary disease.  Original Report Authenticated By:  Etheleen Mayhew, M.D.   01/30/13- 25 yoF never smoker, followed for allergy and asthma. ACUTE VISIT:chest congestion; wheezing, asthma attack last night.  Still on allergy vaccine 1:10 GH  03/03/13-61 yoF never smoker, followed for allergy and asthma. FOLLOWS FOR: still on allergy vaccine 1:10 GH and doing well; no flare ups at this time-using Loratadine daily and helping. tolerating spring pollens Resolved acute illness in late March. Don't identify problems from lisinopril-little cough  09/08/13- 64 yoF never smoker, followed for allergy and asthma. FOLLOWS FOR:  still on Allergy vaccine 1:10 GH and doing well She is noticing easy dyspnea on exertion walking up long chills, without chest pain. This may not be a real change. Occasional cough doesn't bother her. Mild dry throat tickle (lisinopril). Out of Flonase. PFT in 2009: Within normal limits-FEV1/FVC 0.83 with FEV1 2.64/130%.  01/26/14- 40 yoF never smoker, followed for allergy and asthma.  allergy vaccine 1:10 GH FOLLOWS [id number] on Sulfa for boil on body-requesting breathing treatment. Woke this AM with progressive cough, wheeze. No sore throat, fever. Started loratadine 3 weeks ago. Continues allergy vaccine 1:10 [name] boil almost resolved.   03/08/14- 63 yoF never smoker, followed for allergy and asthma. FOLLOWS FOR:  Allergy Vaccine 1:10 GH doing well.  No concerns today. Continues Flonase, loratadine. No wheezing on Symbicort. Spring pollen season has been well controlled.  09/06/14-  63 yoF never smoker, followed for allergy and asthma. FOLLOWS FOR: still on vaccine 1:10 GH and doing well.  03/09/2016-65 year old female never smoker followed for allergic rhinitis, asthma with bronchitis, complicated  by HBP, GERD Allergy Vaccine 1:10 GH FOLLOWS FOR: Pt still on allergy vaccine; no reactions. Pt has noticed that she has bruise on left arm from shot on Friday. Treated by PCP for acute bronchitis last  winter  CXR 10/06/2015-NAD   ROS-see HPI Constitutional:   No-   weight loss, night sweats, fevers, chills, fatigue, lassitude. HEENT:   No-  headaches, difficulty swallowing, tooth/dental problems, sore throat,       No-  sneezing, itching, ear ache, nasal congestion, post nasal drip,  CV:  No-   chest pain, orthopnea, PND, swelling in lower extremities, anasarca, dizziness, palpitations Resp: +shortness of breath with exertion or at rest.              No-   productive cough,  No- non-productive cough,  No- coughing up of blood.              No-   change in color of mucus.  No- wheezing.   Skin: No-   rash or lesions. GI:  No-   heartburn, indigestion, abdominal pain, nausea, vomiting, GU:  MS:  No-   joint pain or swelling.   Neuro-     nothing unusual Psych:  No- change in mood or affect.   Objective:  OBJ- Physical Exam General- Alert, Oriented, Affect-appropriate/ cheerful, Distress- none acute. obese Skin- rash-none, lesions- none, excoriation- none Lymphadenopathy- none Head- atraumatic            Eyes- Gross vision intact, PERRLA, conjunctivae and secretions clear            Ears- Hearing, canals-normal            Nose- +turbinate edema, no-Septal dev,polyps, erosion, perforation             Throat- Mallampati II-III , mucosa clear , drainage- none, tonsils- atrophic, +dentures Neck- flexible , trachea midline, no stridor , thyroid nl, carotid no bruit Chest - symmetrical excursion , unlabored           Heart/CV- RRR , no murmur , no gallop  , no rub, nl s1 s2                           - JVD- none , edema- none, stasis changes- none, varices- none           Lung- clear to P&A, , cough-none, wheeze-none , dullness-none, rub- none           Chest wall-  Abd-  Br/ Gen/ Rectal- Not done, not indicated Extrem- cyanosis- none, clubbing, none, atrophy- none, strength- nl Neuro- grossly intact to observation

## 2016-03-10 ENCOUNTER — Ambulatory Visit (INDEPENDENT_AMBULATORY_CARE_PROVIDER_SITE_OTHER): Payer: BLUE CROSS/BLUE SHIELD | Admitting: Family Medicine

## 2016-03-10 ENCOUNTER — Encounter: Payer: Self-pay | Admitting: Family Medicine

## 2016-03-10 VITALS — BP 113/67 | HR 72 | Temp 98.4°F | Ht 61.0 in | Wt 202.0 lb

## 2016-03-10 DIAGNOSIS — Z0000 Encounter for general adult medical examination without abnormal findings: Secondary | ICD-10-CM

## 2016-03-10 DIAGNOSIS — K589 Irritable bowel syndrome without diarrhea: Secondary | ICD-10-CM | POA: Diagnosis not present

## 2016-03-10 DIAGNOSIS — Z23 Encounter for immunization: Secondary | ICD-10-CM

## 2016-03-10 MED ORDER — DICYCLOMINE HCL 20 MG PO TABS: 20.0000 mg | ORAL_TABLET | Freq: Three times a day (TID) | ORAL | Status: DC

## 2016-03-10 MED ORDER — OMEPRAZOLE 20 MG PO CPDR: 20.0000 mg | DELAYED_RELEASE_CAPSULE | Freq: Every day | ORAL | Status: DC

## 2016-03-10 MED ORDER — LISINOPRIL-HYDROCHLOROTHIAZIDE 20-12.5 MG PO TABS: ORAL_TABLET | ORAL | Status: DC

## 2016-03-10 MED ORDER — SERTRALINE HCL 100 MG PO TABS: 150.0000 mg | ORAL_TABLET | Freq: Every day | ORAL | Status: DC

## 2016-03-10 MED ORDER — POTASSIUM CHLORIDE CRYS ER 20 MEQ PO TBCR: 20.0000 meq | EXTENDED_RELEASE_TABLET | Freq: Every day | ORAL | Status: DC

## 2016-03-10 NOTE — Progress Notes (Signed)
Pre visit review using our clinic review tool, if applicable. No additional management support is needed unless otherwise documented below in the visit note. 

## 2016-03-10 NOTE — Progress Notes (Signed)
   Subjective:    Patient ID: [name], female    DOB: May 15, 1951, 65 y.o.   MRN: [id number]  HPI 65 yr old female for a well exam. She has one complaint today and that is frequent diarrhea. She has had frequent loose stools for years but lately they have become more of an issue. She describes cramping and the urgency to pass a stool shortly after almoist every meal. Her appetite is normal. She has tried eliminating certain foods like salads from her diet and she has tried taking probiotics, but this has not helped. She drinks coffee in the mornings and then a soda or two during the day.    Review of Systems  Constitutional: Negative.   HENT: Negative.   Eyes: Negative.   Respiratory: Negative.   Cardiovascular: Negative.   Gastrointestinal: Negative.   Genitourinary: Negative for dysuria, urgency, frequency, hematuria, flank pain, decreased urine volume, enuresis, difficulty urinating, pelvic pain and dyspareunia.  Musculoskeletal: Negative.   Skin: Negative.   Neurological: Negative.   Psychiatric/Behavioral: Negative.        Objective:   Physical Exam  Constitutional: She is oriented to person, place, and time. She appears well-developed and well-nourished. No distress.  HENT:  Head: Normocephalic and atraumatic.  Right Ear: External ear normal.  Left Ear: External ear normal.  Nose: Nose normal.  Mouth/Throat: Oropharynx is clear and moist. No oropharyngeal exudate.  Eyes: Conjunctivae and EOM are normal. Pupils are equal, round, and reactive to light. No scleral icterus.  Neck: Normal range of motion. Neck supple. No JVD present. No thyromegaly present.  Cardiovascular: Normal rate, regular rhythm, normal heart sounds and intact distal pulses.  Exam reveals no gallop and no friction rub.   No murmur heard. EKG normal   Pulmonary/Chest: Effort normal and breath sounds normal. No respiratory distress. She has no wheezes. She has no rales. She exhibits no tenderness.    Abdominal: Soft. Bowel sounds are normal. She exhibits no distension and no mass. There is no tenderness. There is no rebound and no guarding.  Musculoskeletal: Normal range of motion. She exhibits no edema or tenderness.  Lymphadenopathy:    She has no cervical adenopathy.  Neurological: She is alert and oriented to person, place, and time. She has normal reflexes. No cranial nerve deficit. She exhibits normal muscle tone. Coordination normal.  Skin: Skin is warm and dry. No rash noted. No erythema.  Psychiatric: She has a normal mood and affect. Her behavior is normal. Judgment and thought content normal.          Assessment & Plan:  Well exam. We discussed diet and exercise. 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 I asked her to stop drinking caffeinated beverages during the day and to drink water instead. Try Dicyclomine TID.  Laurey Morale, MD

## 2016-03-16 ENCOUNTER — Ambulatory Visit (INDEPENDENT_AMBULATORY_CARE_PROVIDER_SITE_OTHER): Payer: BLUE CROSS/BLUE SHIELD | Admitting: *Deleted

## 2016-03-16 DIAGNOSIS — J309 Allergic rhinitis, unspecified: Secondary | ICD-10-CM | POA: Diagnosis not present

## 2016-03-23 ENCOUNTER — Ambulatory Visit (INDEPENDENT_AMBULATORY_CARE_PROVIDER_SITE_OTHER): Payer: BLUE CROSS/BLUE SHIELD | Admitting: *Deleted

## 2016-03-23 DIAGNOSIS — J309 Allergic rhinitis, unspecified: Secondary | ICD-10-CM

## 2016-03-26 ENCOUNTER — Telehealth: Payer: Self-pay | Admitting: Family Medicine

## 2016-03-26 ENCOUNTER — Encounter: Payer: Self-pay | Admitting: Internal Medicine

## 2016-03-26 NOTE — Telephone Encounter (Signed)
Pt coming in tomorrow for OV.

## 2016-03-26 NOTE — Telephone Encounter (Signed)
[facility] - Client [facility] Patient Name: [name] DOB: [birth year] Initial Comment Caller is having sx after medication. Dizziness, vomiting , diarrhea Nurse Assessment Nurse: Dimas Chyle, RN, Dellis Filbert Date/Time Eilene Ghazi Time): 03/26/2016 11:21:24 AM Confirm and document reason for call. If symptomatic, describe symptoms. You must click the next button to save text entered. ---Caller is having sx after medication. Dizziness, vomiting , diarrhea. New medication for IBS. Dicyclomine 20 mg tid. Started medication on Monday. Has the patient traveled out of the country within the last 30 days? ---No Does the patient have any new or worsening symptoms? ---Yes Will a triage be completed? ---Yes Related visit to physician within the last 2 weeks? ---Yes Does the PT have any chronic conditions? (i.e. diabetes, asthma, etc.) ---Yes List chronic conditions. ---HTN, IBS Is this a behavioral health or substance abuse call? ---No Guidelines Guideline Title Affirmed Question Affirmed Notes Diarrhea [1] SEVERE diarrhea (e.g., 7 or more times / day more than normal) AND [2] present > 24 hours (1 day) Final Disposition User See Physician within 24 Hours Dimas Chyle, RN, [employer] Referrals REFERRED TO PCP OFFICE Disagree/Comply: Leta Baptist

## 2016-03-26 NOTE — Telephone Encounter (Signed)
error 

## 2016-03-27 ENCOUNTER — Encounter: Payer: Self-pay | Admitting: Family Medicine

## 2016-03-27 ENCOUNTER — Ambulatory Visit (INDEPENDENT_AMBULATORY_CARE_PROVIDER_SITE_OTHER): Payer: BLUE CROSS/BLUE SHIELD | Admitting: Family Medicine

## 2016-03-27 VITALS — BP 129/69 | HR 83 | Temp 98.7°F | Ht 61.0 in | Wt 202.0 lb

## 2016-03-27 DIAGNOSIS — K589 Irritable bowel syndrome without diarrhea: Secondary | ICD-10-CM

## 2016-03-27 MED ORDER — COLESTIPOL HCL 5 G PO GRAN: 5.0000 g | GRANULES | Freq: Two times a day (BID) | ORAL | Status: DC

## 2016-03-27 NOTE — Progress Notes (Signed)
   Subjective:    Patient ID: [name], female    DOB: July 28, 1951, 65 y.o.   MRN: [id number]  HPI Here to follow up on diarrhea and to ask about a 24 hour episode of nausea with vomiting and cramps she had several days ago. She took Dicyclomine every day for 2 weeks and tolerated it well. Then 2 days ago she had the sudden onset of cramps and vomiting. No fever. This resolved byt the next day and now for the past 2 days she has felt fine. She does note that the Dicyclomine never really helped with her diarrhea.    Review of Systems  Constitutional: Negative.   Respiratory: Negative.   Cardiovascular: Negative.   Gastrointestinal: Positive for nausea, vomiting and diarrhea. Negative for abdominal pain, constipation, blood in stool, abdominal distention and anal bleeding.       Objective:   Physical Exam  Constitutional: She appears well-developed and well-nourished.  Cardiovascular: Normal rate, regular rhythm, normal heart sounds and intact distal pulses.   Pulmonary/Chest: Effort normal and breath sounds normal.  Abdominal: Soft. Bowel sounds are normal. She exhibits no distension and no mass. There is no tenderness. There is no rebound and no guarding.          Assessment & Plan:  She seems to have had a 24 hour GI virus a few days ago and she is now back to baseline. The Dicyclomine did not help her diarrhea so she will stop this and instead try Colestipol 5 gm bid.  Laurey Morale, MD

## 2016-03-27 NOTE — Progress Notes (Signed)
Pre visit review using our clinic review tool, if applicable. No additional management support is needed unless otherwise documented below in the visit note. 

## 2016-03-30 ENCOUNTER — Ambulatory Visit (INDEPENDENT_AMBULATORY_CARE_PROVIDER_SITE_OTHER): Payer: BLUE CROSS/BLUE SHIELD | Admitting: *Deleted

## 2016-03-30 ENCOUNTER — Telehealth: Payer: Self-pay | Admitting: Family Medicine

## 2016-03-30 DIAGNOSIS — J309 Allergic rhinitis, unspecified: Secondary | ICD-10-CM

## 2016-03-30 NOTE — Telephone Encounter (Signed)
I called and spoke with pharmacy, gave verbal order.

## 2016-03-30 NOTE — Telephone Encounter (Signed)
Pharm never received rx for  Colestipol [facility]

## 2016-04-01 ENCOUNTER — Telehealth: Payer: Self-pay | Admitting: Internal Medicine

## 2016-04-01 NOTE — Telephone Encounter (Signed)
When pt. Came in for her shot last Fri. She said she was stopping her shots. I was under the impression that you and she discussed stopping to see how she does. I looked at your ov notes and noticed you had said cont, vac. Through this yr. As discussed. Please advise.

## 2016-04-01 NOTE — Telephone Encounter (Signed)
"  As discussed" included option to stop at any time. Ok to stop.

## 2016-04-02 NOTE — Telephone Encounter (Signed)
Ok, just wanted to make sure you were aware. Nothing further needed, closing.

## 2016-04-20 ENCOUNTER — Telehealth: Payer: Self-pay | Admitting: Internal Medicine

## 2016-04-20 NOTE — Telephone Encounter (Signed)
lmtcb

## 2016-04-20 NOTE — Telephone Encounter (Signed)
LMTCB again

## 2016-04-20 NOTE — Telephone Encounter (Signed)
[phone], pt cb

## 2016-04-22 NOTE — Telephone Encounter (Signed)
LMTCB

## 2016-04-23 NOTE — Telephone Encounter (Signed)
lmtcb for pt.  

## 2016-04-24 NOTE — Telephone Encounter (Signed)
lmtcb x4 for pt. Message will be closed per triage protocol.

## 2016-04-27 ENCOUNTER — Telehealth: Payer: Self-pay | Admitting: Internal Medicine

## 2016-04-28 NOTE — Telephone Encounter (Signed)
Called pt. She is aware of CY's recs.. She's has an appt. To come in for an allergy shot 05/01/16. I told her I would have her vac. Ready. Nothing further needed.Closing.

## 2016-04-28 NOTE — Telephone Encounter (Signed)
Ok to resume allergy vaccine while we are able- suggest drop back to 0.3 ml/ vial / week of latest concentration, and rebuild as usual.

## 2016-04-28 NOTE — Telephone Encounter (Signed)
Pt. Last shot was 03/30/16, It's been 4 wks.. Pt. Is wanting to restart her allergy shots. The two of you had discussed her stopping once her supply ran out and she did. Please advise.

## 2016-04-29 ENCOUNTER — Other Ambulatory Visit: Payer: Self-pay | Admitting: Family Medicine

## 2016-04-29 NOTE — Telephone Encounter (Signed)
Can we refill this? 

## 2016-04-30 ENCOUNTER — Ambulatory Visit (INDEPENDENT_AMBULATORY_CARE_PROVIDER_SITE_OTHER): Payer: Worker's Compensation | Admitting: Internal Medicine

## 2016-04-30 ENCOUNTER — Encounter: Payer: Self-pay | Admitting: Internal Medicine

## 2016-04-30 ENCOUNTER — Encounter (HOSPITAL_COMMUNITY): Payer: Self-pay | Admitting: Emergency Medicine

## 2016-04-30 ENCOUNTER — Telehealth: Payer: Self-pay | Admitting: Internal Medicine

## 2016-04-30 ENCOUNTER — Emergency Department (HOSPITAL_COMMUNITY)
Admission: EM | Admit: 2016-04-30 | Discharge: 2016-04-30 | Disposition: A | Discharge status: Home / Self Care | Payer: Worker's Compensation | Attending: Emergency Medicine | Admitting: Emergency Medicine

## 2016-04-30 VITALS — BP 140/80 | HR 91 | Resp 20 | Wt 206.0 lb

## 2016-04-30 DIAGNOSIS — R220 Localized swelling, mass and lump, head: Secondary | ICD-10-CM | POA: Diagnosis not present

## 2016-04-30 DIAGNOSIS — I10 Essential (primary) hypertension: Secondary | ICD-10-CM | POA: Insufficient documentation

## 2016-04-30 DIAGNOSIS — S00501A Unspecified superficial injury of lip, initial encounter: Secondary | ICD-10-CM | POA: Diagnosis present

## 2016-04-30 DIAGNOSIS — W108XXA Fall (on) (from) other stairs and steps, initial encounter: Secondary | ICD-10-CM | POA: Insufficient documentation

## 2016-04-30 DIAGNOSIS — J45909 Unspecified asthma, uncomplicated: Secondary | ICD-10-CM | POA: Diagnosis not present

## 2016-04-30 DIAGNOSIS — Y9301 Activity, walking, marching and hiking: Secondary | ICD-10-CM | POA: Diagnosis not present

## 2016-04-30 DIAGNOSIS — Y999 Unspecified external cause status: Secondary | ICD-10-CM | POA: Diagnosis not present

## 2016-04-30 DIAGNOSIS — S01511A Laceration without foreign body of lip, initial encounter: Secondary | ICD-10-CM | POA: Diagnosis not present

## 2016-04-30 DIAGNOSIS — Z7982 Long term (current) use of aspirin: Secondary | ICD-10-CM | POA: Insufficient documentation

## 2016-04-30 DIAGNOSIS — E785 Hyperlipidemia, unspecified: Secondary | ICD-10-CM | POA: Diagnosis not present

## 2016-04-30 DIAGNOSIS — Y929 Unspecified place or not applicable: Secondary | ICD-10-CM | POA: Diagnosis not present

## 2016-04-30 DIAGNOSIS — Z79899 Other long term (current) drug therapy: Secondary | ICD-10-CM | POA: Insufficient documentation

## 2016-04-30 DIAGNOSIS — F329 Major depressive disorder, single episode, unspecified: Secondary | ICD-10-CM | POA: Diagnosis not present

## 2016-04-30 DIAGNOSIS — T148 Other injury of unspecified body region: Secondary | ICD-10-CM | POA: Diagnosis not present

## 2016-04-30 DIAGNOSIS — IMO0002 Reserved for concepts with insufficient information to code with codable children: Secondary | ICD-10-CM

## 2016-04-30 DIAGNOSIS — J309 Allergic rhinitis, unspecified: Secondary | ICD-10-CM | POA: Diagnosis not present

## 2016-04-30 DIAGNOSIS — S0081XA Abrasion of other part of head, initial encounter: Secondary | ICD-10-CM

## 2016-04-30 MED ORDER — IBUPROFEN 200 MG PO TABS
600.0000 mg | ORAL_TABLET | Freq: Once | ORAL | Status: AC
  Administered 2016-04-30: 600 mg via ORAL
  Filled 2016-04-30: qty 3

## 2016-04-30 NOTE — Telephone Encounter (Signed)
Allergy Serum Extract Date Mixed: 04/30/16 Vial: 1 Strength: 1:10 Restart 1:10 at 0.3 per CY Here/Mail/Pick Up: here Mixed By: tbs Last OV: 03/09/16 Pending OV: N/A

## 2016-04-30 NOTE — ED Provider Notes (Signed)
CSN: [id number]     Arrival date & time 04/30/16  1825 History   By signing my name below, I, Emmanuella Mensah, attest that this documentation has been prepared under the direction and in the presence of [employer], PA-C. Electronically Signed: Judithann Sauger, ED Scribe. 04/30/2016. 9:26 PM.    Chief Complaint  Patient presents with  . Fall   The history is provided by the patient. No language interpreter was used.   HPI Comments: [name] is a 65 y.o. female who presents to the Emergency Department for evaluation of multiple abrasions to top of nose and chin as well as a laceration to the inside of her bottom lip s/p fall that occurred PTA. She notes that the abrasions were bleeding but it has since stopped. She reports associated nose bleed. Pt explains that she was walking around on cement when she missed a step, falling straight on her face. She adds that she was wearing glasses which broke during the fall. No LOC. No alleviating factors noted. Pt has not tried any medications PTA. She denies any current anticoagulant use. She is unsure of last tetanus vaccine. She denies any visual changes, ear discharge, hearing changes, or neck pain.    Past Medical History  Diagnosis Date  . Allergy   . GERD (gastroesophageal reflux disease)   . Hypertension   . Depression   . Gynecological examination     sees Dr. Paula Compton  . Asthma     sees Dr. Baird Lyons  . Diverticulosis   . Colon polyp 2007  . Hyperlipemia    Past Surgical History  Procedure Laterality Date  . Carpal tunnel release Right 1980  . Colonoscopy  06-20-14    per Dr. Olevia Perches, adenomatous polyp,repeat in 5 yrs   . Dilation and curettage of uterus  2005   Family History  Problem Relation Age of Onset  . Colon cancer Mother 74  . Colon cancer Father 103  . Liver cancer Father   . Diabetes Sister   . Throat cancer Brother   . Colon cancer Maternal Aunt     not sure age of onset  . Colon cancer  Maternal Aunt     not sure age of onset  . Pancreatic cancer Neg Hx   . Rectal cancer Neg Hx   . Stomach cancer Neg Hx    Social History  Substance Use Topics  . Smoking status: Never Smoker   . Smokeless tobacco: Never Used  . Alcohol Use: 0.0 oz/week    0 Standard drinks or equivalent per week     Comment: occ   OB History    No data available     Review of Systems  A complete 10 system review of systems was obtained and all systems are negative except as noted in the HPI and PMH.    Allergies  Doxycycline  Home Medications   Prior to Admission medications   Medication Sig Start Date End Date Taking? Authorizing Provider  albuterol (PROAIR HFA) 108 (90 Base) MCG/ACT inhaler INHALE 2 PUFFS INTO THE LUNGS EVERY 6 HOURS AS NEEDED FOR WHEEZING. 03/09/16   Deneise Lever, MD  ANUCORT-HC 25 MG suppository PLACE 1 SUPPOSITORY RECTALLY AT BEDTIME (MUST KEEP COLONOSCOPY FOR FURTHER REFILLS) 04/29/16   Laurey Morale, MD  aspirin 81 MG tablet Take 81 mg by mouth daily.      Historical Provider, MD  budesonide-formoterol (SYMBICORT) 80-4.5 MCG/ACT inhaler 2 puffs then rinse mouth, twice daily  maintenance inhaler 03/09/16   Deneise Lever, MD  colestipol (COLESTID) 5 g granules Take 5 g by mouth 2 (two) times daily. 03/27/16   Laurey Morale, MD  Flaxseed, Linseed, (FLAX SEED OIL) 1000 MG CAPS Take 1 capsule by mouth daily.      Historical Provider, MD  fluticasone (FLONASE) 50 MCG/ACT nasal spray 1-2 sprays each nostril once or twice daily 03/09/16   Deneise Lever, MD  HYDROcodone-homatropine (HYDROMET) 5-1.5 MG/5ML syrup Take 5 mLs by mouth every 4 (four) hours as needed. 12/26/15   Laurey Morale, MD  lisinopril-hydrochlorothiazide (PRINZIDE,ZESTORETIC) 20-12.5 MG tablet TAKE ONE TABLET BY MOUTH EVERY DAY 03/10/16   Laurey Morale, MD  NON FORMULARY Allergy vaccine. Once a week     Historical Provider, MD  omeprazole (PRILOSEC) 20 MG capsule Take 1 capsule (20 mg total) by mouth daily. 03/10/16    Laurey Morale, MD  potassium chloride SA (K-DUR,KLOR-CON) 20 MEQ tablet Take 1 tablet (20 mEq total) by mouth daily. 03/10/16   Laurey Morale, MD  pyridOXINE (VITAMIN B-6) 100 MG tablet Take 100 mg by mouth daily.      Historical Provider, MD  sertraline (ZOLOFT) 100 MG tablet Take 1.5 tablets (150 mg total) by mouth daily. 03/10/16 04/12/17  Laurey Morale, MD   BP 142/77 mmHg  Pulse 85  Temp(Src) 98.2 F (36.8 C) (Oral)  Resp 18  Ht 5\' 1"  (1.549 m)  Wt 93.441 kg  BMI 38.94 kg/m2  SpO2 99% Physical Exam  Constitutional: She is oriented to person, place, and time. She appears well-developed and well-nourished.  Awake, alert, nontoxic appearance.  HENT:  Head: Normocephalic and atraumatic. Head is without Battle's sign.  Right Ear: Tympanic membrane, external ear and ear canal normal.  Left Ear: Tympanic membrane, external ear and ear canal normal.  Mouth/Throat: Oropharynx is clear and moist.  Mild ecchymosis around superior bridge of nose Superficial abrasion to bridge of nose, base of chin, and right external nares.  Small laceration on lower lip mucosa, also superficial and roughly .5 cm.   Eyes: Conjunctivae are normal. Pupils are equal, round, and reactive to light. Right eye exhibits no discharge. Left eye exhibits no discharge. No scleral icterus.  Neck: Normal range of motion. Neck supple.  Cardiovascular: Normal rate, regular rhythm and normal heart sounds.   Pulmonary/Chest: Effort normal and breath sounds normal. No respiratory distress. She has no wheezes. She has no rales. She exhibits no tenderness.  Abdominal: Soft. There is no tenderness. There is no rebound.  Musculoskeletal: Normal range of motion. She exhibits no tenderness.  No C-spine tenderness, full active ROM  Neurological: She is alert and oriented to person, place, and time. No cranial nerve deficit. Coordination normal.  Mental status and motor strength appears baseline for patient and situation. Sensation intact  to light touch. Gait is baseline. No ataxia  Skin: Skin is warm and dry. No rash noted.  Psychiatric: She has a normal mood and affect.  Nursing note and vitals reviewed.   ED Course  Procedures (including critical care time) DIAGNOSTIC STUDIES: Oxygen Saturation is 99% on RA, normal by my interpretation.    COORDINATION OF CARE: 9:15 PM- Pt advised of plan for treatment and pt agrees. Advised to use ice and Neosporin. Pt will receive tetanus vaccine. Pt declines CT scan.   MDM  Patient sustained superficial abrasions to face, nose, chin after mechanical fall on stairs. No anticoagulation. States she feels well. Mild swelling on  bridge of nose. Remains neurovascularly intact. No headache, neurological complaints, vision changes, difficulties breathing or other medical symptoms. Declines any imaging at this time. Prefers symptomatic support at home and will follow-up with her PCP. I feel this is reasonable. She overall appears well, nontoxic, hemodynamically stable and appropriate for outpatient follow-up. Discussed anti-inflammatories, ice packs. Return precautions discussed. Patient and fianc at bedside agree with plan and subsequent discharge. No other questions or concerns.  Final diagnoses:  Abrasion of face, initial encounter  Fall (on) (from) other stairs and steps, initial encounter    I personally performed the services described in this documentation, which was scribed in my presence. The recorded information has been reviewed and is accurate.   Comer Locket, PA-C [id number]  Delora Fuel, MD [id number]

## 2016-04-30 NOTE — Discharge Instructions (Signed)
Please follow-up with your doctor in the next couple of days for reevaluation. You may continue using Tylenol and Motrin as we discussed. You may also use ice packs to help with the swelling and inflammation. Return to ED for any new or worsening symptoms as we discussed.  Abrasion An abrasion is a cut or scrape on the outer surface of your skin. An abrasion does not extend through all of the layers of your skin. It is important to care for your abrasion properly to prevent infection. CAUSES Most abrasions are caused by falling on or gliding across the ground or another surface. When your skin rubs on something, the outer and inner layer of skin rubs off.  SYMPTOMS A cut or scrape is the main symptom of this condition. The scrape may be bleeding, or it may appear red or pink. If there was an associated fall, there may be an underlying bruise. DIAGNOSIS An abrasion is diagnosed with a physical exam. TREATMENT Treatment for this condition depends on how large and deep the abrasion is. Usually, your abrasion will be cleaned with water and mild soap. This removes any dirt or debris that may be stuck. An antibiotic ointment may be applied to the abrasion to help prevent infection. A bandage (dressing) may be placed on the abrasion to keep it clean. You may also need a tetanus shot. HOME CARE INSTRUCTIONS Medicines  Take or apply medicines only as directed by your health care provider.  If you were prescribed an antibiotic ointment, finish all of it even if you start to feel better. Wound Care  Clean the wound with mild soap and water 2-3 times per day or as directed by your health care provider. Pat your wound dry with a clean towel. Do not rub it.  There are many different ways to close and cover a wound. Follow instructions from your health care provider about:  Wound care.  Dressing changes and removal.  Check your wound every day for signs of infection. Watch for:  Redness, swelling, or  pain.  Fluid, blood, or pus. General Instructions  Keep the dressing dry as directed by your health care provider. Do not take baths, swim, use a hot tub, or do anything that would put your wound underwater until your health care provider approves.  If there is swelling, raise (elevate) the injured area above the level of your heart while you are sitting or lying down.  Keep all follow-up visits as directed by your health care provider. This is important. SEEK MEDICAL CARE IF:  You received a tetanus shot and you have swelling, severe pain, redness, or bleeding at the injection site.  Your pain is not controlled with medicine.  You have increased redness, swelling, or pain at the site of your wound. SEEK IMMEDIATE MEDICAL CARE IF:  You have a red streak going away from your wound.  You have a fever.  You have fluid, blood, or pus coming from your wound.  You notice a bad smell coming from your wound or your dressing.   This information is not intended to replace advice given to you by your health care provider. Make sure you discuss any questions you have with your health care provider.   Document Released: 08/05/2005 Document Revised: 07/17/2015 Document Reviewed: 10/24/2014 Elsevier Interactive Patient Education 2016 [place] Taking care of your wound properly can help to prevent pain and infection. It can also help your wound to heal more quickly.  HOW TO  CARE FOR YOUR WOUND  Take or apply over-the-counter and prescription medicines only as told by your health care provider.  If you were prescribed antibiotic medicine, take or apply it as told by your health care provider. Do not stop using the antibiotic even if your condition improves.  Clean the wound each day or as told by your health care provider.  Wash the wound with mild soap and water.  Rinse the wound with water to remove all soap.  Pat the wound dry with a clean towel. Do not rub  it.  There are many different ways to close and cover a wound. For example, a wound can be covered with stitches (sutures), skin glue, or adhesive strips. Follow instructions from your health care provider about:  How to take care of your wound.  When and how you should change your bandage (dressing).  When you should remove your dressing.  Removing whatever was used to close your wound.  Check your wound every day for signs of infection. Watch for:  Redness, swelling, or pain.  Fluid, blood, or pus.  Keep the dressing dry until your health care provider says it can be removed. Do not take baths, swim, use a hot tub, or do anything that would put your wound underwater until your health care provider approves.  Raise (elevate) the injured area above the level of your heart while you are sitting or lying down.  Do not scratch or pick at the wound.  Keep all follow-up visits as told by your health care provider. This is important. SEEK MEDICAL CARE IF:  You received a tetanus shot and you have swelling, severe pain, redness, or bleeding at the injection site.  You have a fever.  Your pain is not controlled with medicine.  You have increased redness, swelling, or pain at the site of your wound.  You have fluid, blood, or pus coming from your wound.  You notice a bad smell coming from your wound or your dressing. SEEK IMMEDIATE MEDICAL CARE IF:  You have a red streak going away from your wound.   This information is not intended to replace advice given to you by your health care provider. Make sure you discuss any questions you have with your health care provider.   Document Released: 08/04/2008 Document Revised: 03/12/2015 Document Reviewed: 10/22/2014 Elsevier Interactive Patient Education [employer].

## 2016-04-30 NOTE — Progress Notes (Signed)
Pre visit review using our clinic review tool, if applicable. No additional management support is needed unless otherwise documented below in the visit note. 

## 2016-04-30 NOTE — Patient Instructions (Signed)
Please go to ER now as it appears you will need stitches to the inner lower lip on the right side  Please continue all other medications as before, and refills have been done if requested.  Please have the pharmacy call with any other refills you may need.  Please keep your appointments with your specialists as you may have planned

## 2016-04-30 NOTE — ED Notes (Signed)
Pt states she fell earlier today  Pt states she tripped and landed on her face  Pt states it broke her glasses  Pt has abrasions to her face, nose, and right knee  Pt states her nose was bleeding earlier but it has stopped  Pt has a laceration to the inside of her lower lip  Pt has ice pack in place to her face  Pt denies LOC

## 2016-05-01 ENCOUNTER — Ambulatory Visit (INDEPENDENT_AMBULATORY_CARE_PROVIDER_SITE_OTHER): Payer: BLUE CROSS/BLUE SHIELD | Admitting: *Deleted

## 2016-05-01 DIAGNOSIS — J309 Allergic rhinitis, unspecified: Secondary | ICD-10-CM | POA: Diagnosis not present

## 2016-05-01 NOTE — Assessment & Plan Note (Signed)
Mild to mod, but given nature of accident should be considered I think for urgent nasal/facial films,  to f/u any worsening symptoms or concerns

## 2016-05-01 NOTE — Progress Notes (Signed)
Subjective:    Patient ID: [name], female    DOB: 04/08/1951, 65 y.o.   MRN: [id number]  HPI  Here to f/u after simple trip and fall concrete in flip flops at work today.  Simply could not catch or stop herself and fell to face.  C/o no other significant injury except has cont'd bleeing/oozing from very small superficial laceration at the right bridge of nose, as well as nearly stopped now bleeding from rather large and deep lip laceration to right lower inner lip.  No LOC, denies HA though face hurts, and Pt denies new neurological symptoms such as new headache, or facial or extremity weakness or numbness  Pt denies chest pain, increased sob or doe, wheezing, orthopnea, PND, increased LE swelling, palpitations, dizziness or syncope.  No anticoagulant use and bleeding now nearly stopped. Past Medical History  Diagnosis Date  . Allergy   . GERD (gastroesophageal reflux disease)   . Hypertension   . Depression   . Gynecological examination     sees Dr. Paula Compton  . Asthma     sees Dr. Baird Lyons  . Diverticulosis   . Colon polyp 2007  . Hyperlipemia    Past Surgical History  Procedure Laterality Date  . Carpal tunnel release Right 1980  . Colonoscopy  06-20-14    per Dr. Olevia Perches, adenomatous polyp,repeat in 5 yrs   . Dilation and curettage of uterus  2005    reports that she has never smoked. She has never used smokeless tobacco. She reports that she drinks alcohol. She reports that she does not use illicit drugs. family history includes Colon cancer in her maternal aunt and maternal aunt; Colon cancer (age of onset: 62) in her mother; Colon cancer (age of onset: 15) in her father; Diabetes in her sister; Liver cancer in her father; Throat cancer in her brother. There is no history of Pancreatic cancer, Rectal cancer, or Stomach cancer. Allergies  Allergen Reactions  . Doxycycline     REACTION: jittery   Current Outpatient Prescriptions on File Prior to Visit    Medication Sig Dispense Refill  . albuterol (PROAIR HFA) 108 (90 Base) MCG/ACT inhaler INHALE 2 PUFFS INTO THE LUNGS EVERY 6 HOURS AS NEEDED FOR WHEEZING. 3 Inhaler 3  . ANUCORT-HC 25 MG suppository PLACE 1 SUPPOSITORY RECTALLY AT BEDTIME (MUST KEEP COLONOSCOPY FOR FURTHER REFILLS) 12 suppository 11  . aspirin 81 MG tablet Take 81 mg by mouth daily.      . budesonide-formoterol (SYMBICORT) 80-4.5 MCG/ACT inhaler 2 puffs then rinse mouth, twice daily maintenance inhaler 3 Inhaler 3  . colestipol (COLESTID) 5 g granules Take 5 g by mouth 2 (two) times daily. 500 g 5  . Flaxseed, Linseed, (FLAX SEED OIL) 1000 MG CAPS Take 1 capsule by mouth daily.      . fluticasone (FLONASE) 50 MCG/ACT nasal spray 1-2 sprays each nostril once or twice daily 48 g 3  . HYDROcodone-homatropine (HYDROMET) 5-1.5 MG/5ML syrup Take 5 mLs by mouth every 4 (four) hours as needed. 240 mL 0  . lisinopril-hydrochlorothiazide (PRINZIDE,ZESTORETIC) 20-12.5 MG tablet TAKE ONE TABLET BY MOUTH EVERY DAY 90 tablet 3  . NON FORMULARY Allergy vaccine. Once a week     . omeprazole (PRILOSEC) 20 MG capsule Take 1 capsule (20 mg total) by mouth daily. 90 capsule 3  . potassium chloride SA (K-DUR,KLOR-CON) 20 MEQ tablet Take 1 tablet (20 mEq total) by mouth daily. 90 tablet 3  . pyridOXINE (VITAMIN B-6) 100  MG tablet Take 100 mg by mouth daily.      . sertraline (ZOLOFT) 100 MG tablet Take 1.5 tablets (150 mg total) by mouth daily. 135 tablet 3   No current facility-administered medications on file prior to visit.   Review of Systems  Constitutional: Negative for unusual diaphoresis or night sweats HENT: Negative for ear swelling or discharge Eyes: Negative for worsening visual haziness  Respiratory: Negative for choking and stridor.   Gastrointestinal: Negative for distension or worsening eructation Genitourinary: Negative for retention or change in urine volume.  Musculoskeletal: Negative for other MSK pain or swelling Skin:  Negative for color change and worsening wound Neurological: Negative for tremors and numbness other than noted  Psychiatric/Behavioral: Negative for decreased concentration or agitation other than above       Objective:   Physical Exam BP 140/80 mmHg  Pulse 91  Resp 20  Wt 206 lb (93.441 kg)  SpO2 97% VS noted, not ill appearing Constitutional: Pt appears in no apparent distress HENT: Head: NCAT. Except for 1/4 cm slightly curved small very superficial laceration noted to right bridge of nose, with persistent small oozing despite frequent attempts at pressure Mouth with right lower inner lip with > 1 cm several layers deep laceration without current oozing or blood loss Nose with 1-2+ overall swelling with some bruising noted at the left sided aspect of the bridge of nose CN 2-12 intact Right Ear: External ear normal.  Left Ear: External ear normal.  Eyes: . Pupils are equal, round, and reactive to light. Conjunctivae and EOM are normal Neck: Normal range of motion. Neck supple.  Cardiovascular: Normal rate and regular rhythm.   Pulmonary/Chest: Effort normal and breath sounds without rales or wheezing.  Neurological: Pt is alert. Not confused , motor grossly intact Skin: Skin is warm. No rash, no LE edema Psychiatric: Pt behavior is normal. No agitation.     Assessment & Plan:

## 2016-05-01 NOTE — Assessment & Plan Note (Signed)
New onset s/p fall earlier today, large and deeper, should ideally require stitches, pt is referred to ED for further eval and tx

## 2016-05-01 NOTE — Assessment & Plan Note (Signed)
Small, to bridge of nose, likely can be steri-strip,  to f/u any worsening symptoms or concerns

## 2016-05-14 ENCOUNTER — Ambulatory Visit (INDEPENDENT_AMBULATORY_CARE_PROVIDER_SITE_OTHER): Payer: Worker's Compensation | Admitting: Internal Medicine

## 2016-05-14 ENCOUNTER — Ambulatory Visit: Payer: Worker's Compensation

## 2016-05-14 VITALS — BP 122/68 | HR 81 | Temp 98.0°F | Resp 18 | Ht 61.0 in | Wt 206.0 lb

## 2016-05-14 DIAGNOSIS — M25561 Pain in right knee: Secondary | ICD-10-CM

## 2016-05-14 DIAGNOSIS — M79641 Pain in right hand: Secondary | ICD-10-CM

## 2016-05-14 MED ORDER — LIDOCAINE VISCOUS 2 % MT SOLN: OROMUCOSAL | Status: DC

## 2016-05-14 NOTE — Progress Notes (Signed)
   Subjective:    Patient ID: [name], female    DOB: August 13, 1951, 65 y.o.   MRN: [id number]  HPI: [employer] at Work on June 22--needed emergency room evaluation Lip laceration that did not require stitches, fractured nose, several other contusions. Has been improving but right knee has significant pain with weightbearing and still has moderate swelling over the kneecap.-This was not evaluated in the emergency room Also complaining of right hand pain when anyone tries to hold her hand or shaking hand. She injured some teeth and this is still not been evaluated by dentistry. Her lip laceration is painful with eating or brushing teeth   Review of Systems Noncontributory    Objective:   Physical Exam BP 122/68 mmHg  Pulse 81  Temp(Src) 98 F (36.7 C) (Oral)  Resp 18  Ht 5\' 1"  (1.549 m)  Wt 206 lb (93.441 kg)  BMI 38.94 kg/m2  SpO2 96% HEENT clear except healing laceration of lower inner lip which is sensitive and has granulation tissue Neck good range of motion Right hand is tender over the dorsum of the fourth metacarpal area without swelling or crepitus Grip is slightly uncomfortable but not weak Right knee is swollen over the patella with a healing abrasion. Patellar ballottement is uncomfortable. External and internal ligaments are stable to exam No joint effusion No lower extremity edema  X-ray of the right hand shows no fracture X-ray of the right knee shows no fracture       Assessment & Plan:  Pain in right hand--reassured  Pain in right knee - due to patella contusion---knee sleeve for comfort  Lip laceration/dental injury---to f/u with dentist--Viscous Xylocaine  F/u if not better 2 weeks

## 2016-05-14 NOTE — Patient Instructions (Signed)
     IF you received an x-ray today, you will receive an invoice from [facility]. Please contact [facility] at [phone] with questions or concerns regarding your invoice.   IF you received labwork today, you will receive an invoice from [facility]. Please contact [facility] at [phone] with questions or concerns regarding your invoice.   Our billing staff will not be able to assist you with questions regarding bills from these companies.  You will be contacted with the lab results as soon as they are available. The fastest way to get your results is to activate your My Chart account. Instructions are located on the last page of this paperwork. If you have not heard from us regarding the results in 2 weeks, please contact this office.      

## 2016-05-18 ENCOUNTER — Ambulatory Visit (INDEPENDENT_AMBULATORY_CARE_PROVIDER_SITE_OTHER): Payer: BLUE CROSS/BLUE SHIELD

## 2016-05-18 DIAGNOSIS — J309 Allergic rhinitis, unspecified: Secondary | ICD-10-CM | POA: Diagnosis not present

## 2016-05-25 ENCOUNTER — Ambulatory Visit (INDEPENDENT_AMBULATORY_CARE_PROVIDER_SITE_OTHER): Payer: BLUE CROSS/BLUE SHIELD | Admitting: *Deleted

## 2016-05-25 DIAGNOSIS — J309 Allergic rhinitis, unspecified: Secondary | ICD-10-CM

## 2016-06-01 ENCOUNTER — Ambulatory Visit: Payer: BLUE CROSS/BLUE SHIELD

## 2016-06-03 ENCOUNTER — Ambulatory Visit (INDEPENDENT_AMBULATORY_CARE_PROVIDER_SITE_OTHER): Payer: BLUE CROSS/BLUE SHIELD | Admitting: *Deleted

## 2016-06-03 DIAGNOSIS — J309 Allergic rhinitis, unspecified: Secondary | ICD-10-CM

## 2016-06-08 ENCOUNTER — Ambulatory Visit (INDEPENDENT_AMBULATORY_CARE_PROVIDER_SITE_OTHER): Payer: BLUE CROSS/BLUE SHIELD | Admitting: *Deleted

## 2016-06-08 DIAGNOSIS — J309 Allergic rhinitis, unspecified: Secondary | ICD-10-CM | POA: Diagnosis not present

## 2016-06-15 ENCOUNTER — Ambulatory Visit (INDEPENDENT_AMBULATORY_CARE_PROVIDER_SITE_OTHER): Payer: BLUE CROSS/BLUE SHIELD | Admitting: *Deleted

## 2016-06-15 DIAGNOSIS — J309 Allergic rhinitis, unspecified: Secondary | ICD-10-CM | POA: Diagnosis not present

## 2016-06-15 NOTE — Progress Notes (Signed)
I don't know what's going on. The computer's a lil' slow this morning. Pt. Only gets one shot. It wouldn't go to charge capture and all of a sudden 2 charges come up.

## 2016-06-22 ENCOUNTER — Ambulatory Visit (INDEPENDENT_AMBULATORY_CARE_PROVIDER_SITE_OTHER): Payer: BLUE CROSS/BLUE SHIELD | Admitting: *Deleted

## 2016-06-22 DIAGNOSIS — J309 Allergic rhinitis, unspecified: Secondary | ICD-10-CM

## 2016-06-29 ENCOUNTER — Ambulatory Visit (INDEPENDENT_AMBULATORY_CARE_PROVIDER_SITE_OTHER): Payer: BLUE CROSS/BLUE SHIELD | Admitting: *Deleted

## 2016-06-29 ENCOUNTER — Encounter: Payer: Self-pay | Admitting: Family Medicine

## 2016-06-29 ENCOUNTER — Ambulatory Visit (INDEPENDENT_AMBULATORY_CARE_PROVIDER_SITE_OTHER): Payer: BLUE CROSS/BLUE SHIELD | Admitting: Family Medicine

## 2016-06-29 VITALS — BP 110/60 | HR 88 | Temp 98.3°F | Ht 61.0 in | Wt 205.3 lb

## 2016-06-29 DIAGNOSIS — J309 Allergic rhinitis, unspecified: Secondary | ICD-10-CM | POA: Diagnosis not present

## 2016-06-29 DIAGNOSIS — M950 Acquired deformity of nose: Secondary | ICD-10-CM

## 2016-06-29 DIAGNOSIS — H6983 Other specified disorders of Eustachian tube, bilateral: Secondary | ICD-10-CM | POA: Diagnosis not present

## 2016-06-29 NOTE — Progress Notes (Signed)
HPI:  Acute visit for Ear fullness: -started: yesterday - ears feel full with popping at times -hx allergic rhinitis, sees allergist and on INS, antihistamine -denies sinus pain, fevers, malaise, ear pain, drainage from ears, SOB, wheezing -broke nose about 3 months ago, s/p eval in ucc, healed well, but now with deviated septum  ROS: See pertinent positives and negatives per HPI.  Past Medical History:  Diagnosis Date  . Allergy   . Asthma    sees Dr. Baird Lyons  . Colon polyp 2007  . Depression   . Diverticulosis   . GERD (gastroesophageal reflux disease)   . Gynecological examination    sees Dr. Paula Compton  . Hyperlipemia   . Hypertension     Past Surgical History:  Procedure Laterality Date  . CARPAL TUNNEL RELEASE Right 1980  . COLONOSCOPY  06-20-14   per Dr. Olevia Perches, adenomatous polyp,repeat in 5 yrs   . DILATION AND CURETTAGE OF UTERUS  2005    Family History  Problem Relation Age of Onset  . Colon cancer Mother 47  . Colon cancer Father 13  . Liver cancer Father   . Diabetes Sister   . Throat cancer Brother   . Colon cancer Maternal Aunt     not sure age of onset  . Colon cancer Maternal Aunt     not sure age of onset  . Pancreatic cancer Neg Hx   . Rectal cancer Neg Hx   . Stomach cancer Neg Hx     Social History   Social History  . Marital status: Divorced    Spouse name: N/A  . Number of children: 2  . Years of education: N/A   Occupational History  .  [employer]   Social History Main Topics  . Smoking status: Never Smoker  . Smokeless tobacco: Never Used  . Alcohol use 0.0 oz/week     Comment: occ  . Drug use: No  . Sexual activity: Not Asked   Other Topics Concern  . None   Social History Narrative   Daily caffeine      Current Outpatient Prescriptions:  .  albuterol (PROAIR HFA) 108 (90 Base) MCG/ACT inhaler, INHALE 2 PUFFS INTO THE LUNGS EVERY 6 HOURS AS NEEDED FOR WHEEZING., Disp: 3 Inhaler, Rfl: 3 .   ANUCORT-HC 25 MG suppository, PLACE 1 SUPPOSITORY RECTALLY AT BEDTIME (MUST KEEP COLONOSCOPY FOR FURTHER REFILLS), Disp: 12 suppository, Rfl: 11 .  aspirin 81 MG tablet, Take 81 mg by mouth daily.  , Disp: , Rfl:  .  budesonide-formoterol (SYMBICORT) 80-4.5 MCG/ACT inhaler, 2 puffs then rinse mouth, twice daily maintenance inhaler, Disp: 3 Inhaler, Rfl: 3 .  colestipol (COLESTID) 5 g granules, Take 5 g by mouth 2 (two) times daily., Disp: 500 g, Rfl: 5 .  Flaxseed, Linseed, (FLAX SEED OIL) 1000 MG CAPS, Take 1 capsule by mouth daily.  , Disp: , Rfl:  .  fluticasone (FLONASE) 50 MCG/ACT nasal spray, 1-2 sprays each nostril once or twice daily, Disp: 48 g, Rfl: 3 .  HYDROcodone-homatropine (HYDROMET) 5-1.5 MG/5ML syrup, Take 5 mLs by mouth every 4 (four) hours as needed., Disp: 240 mL, Rfl: 0 .  lidocaine (XYLOCAINE) 2 % solution, Use small amount as needed every 2 hours, Disp: 60 mL, Rfl: 0 .  lisinopril-hydrochlorothiazide (PRINZIDE,ZESTORETIC) 20-12.5 MG tablet, TAKE ONE TABLET BY MOUTH EVERY DAY, Disp: 90 tablet, Rfl: 3 .  NON FORMULARY, Allergy vaccine. Once a week , Disp: , Rfl:  .  omeprazole (PRILOSEC) 20 MG capsule, Take 1 capsule (20 mg total) by mouth daily., Disp: 90 capsule, Rfl: 3 .  potassium chloride SA (K-DUR,KLOR-CON) 20 MEQ tablet, Take 1 tablet (20 mEq total) by mouth daily., Disp: 90 tablet, Rfl: 3 .  pyridOXINE (VITAMIN B-6) 100 MG tablet, Take 100 mg by mouth daily.  , Disp: , Rfl:  .  sertraline (ZOLOFT) 100 MG tablet, Take 1.5 tablets (150 mg total) by mouth daily., Disp: 135 tablet, Rfl: 3  EXAM:  Vitals:   06/29/16 1347  BP: 110/60  Pulse: 88  Temp: 98.3 F (36.8 C)    Body mass index is 38.79 kg/m.  GENERAL: vitals reviewed and listed above, alert, oriented, appears well hydrated and in no acute distress  HEENT:crooked nose, conjunttiva clear, no obvious abnormalities on inspection of external nose and ears, normal appearance of ear canals and TMs except for  clear effusion L>R, clear nasal congestion, deviated septum, mild post oropharyngeal erythema with PND, no tonsillar edema or exudate, no sinus TTP  NECK: no obvious masses on inspection  LUNGS: clear to auscultation bilaterally, no wheezes, rales or rhonchi, good air movement  CV: HRRR, no peripheral edema  MS: moves all extremities without noticeable abnormality  PSYCH: pleasant and cooperative, no obvious depression or anxiety  ASSESSMENT AND PLAN:  Discussed the following assessment and plan:  Allergic rhinitis, unspecified allergic rhinitis type  ETD (eustachian tube dysfunction), bilateral  Deformity of nose  -short course nasal decongestant -cont allergy regimen -she plans to see ENT if persistent issues as she feels deviated nose may have contributed - number provided to call -of course, we advised to return or notify a doctor immediately if symptoms worsen or persist or new concerns arise.    Patient Instructions  Continue your allergy regimen.  Add a short course of Afrin - twice daily for 4 days. Do not use longer then 4 days.  See Ear, nose and throat doctor if symptoms persist.   Colin Benton R., DO

## 2016-06-29 NOTE — Patient Instructions (Signed)
Continue your allergy regimen.  Add a short course of Afrin - twice daily for 4 days. Do not use longer then 4 days.  See Ear, nose and throat doctor if symptoms persist.

## 2016-06-29 NOTE — Progress Notes (Signed)
Pre visit review using our clinic review tool, if applicable. No additional management support is needed unless otherwise documented below in the visit note. 

## 2016-07-06 ENCOUNTER — Ambulatory Visit (INDEPENDENT_AMBULATORY_CARE_PROVIDER_SITE_OTHER): Payer: BLUE CROSS/BLUE SHIELD | Admitting: *Deleted

## 2016-07-06 DIAGNOSIS — J309 Allergic rhinitis, unspecified: Secondary | ICD-10-CM | POA: Diagnosis not present

## 2016-07-20 ENCOUNTER — Ambulatory Visit (INDEPENDENT_AMBULATORY_CARE_PROVIDER_SITE_OTHER): Payer: BLUE CROSS/BLUE SHIELD | Admitting: *Deleted

## 2016-07-20 DIAGNOSIS — J309 Allergic rhinitis, unspecified: Secondary | ICD-10-CM | POA: Diagnosis not present

## 2016-07-27 ENCOUNTER — Ambulatory Visit (INDEPENDENT_AMBULATORY_CARE_PROVIDER_SITE_OTHER): Payer: BLUE CROSS/BLUE SHIELD | Admitting: *Deleted

## 2016-07-27 DIAGNOSIS — J309 Allergic rhinitis, unspecified: Secondary | ICD-10-CM

## 2016-08-03 ENCOUNTER — Telehealth: Payer: Self-pay | Admitting: Internal Medicine

## 2016-08-03 MED ORDER — FIRST-DUKES MOUTHWASH MT SUSP: OROMUCOSAL | 0 refills | Status: DC

## 2016-08-03 NOTE — Telephone Encounter (Signed)
lmtcb x1 for pt. 

## 2016-08-03 NOTE — Telephone Encounter (Signed)
Pt returned call, advised of CY's response as stated below.  Pt voiced her understanding Rx sent to verified pharmacy Nothing further needed; will sign off

## 2016-08-03 NOTE — Telephone Encounter (Signed)
Spoke with pt. She is requesting a prescription for Magic Mouthwash. States that she has thrush in her mouth from not rinsing her mouth after using her inhalers.  CY - please advise. Thanks.

## 2016-08-03 NOTE — Telephone Encounter (Signed)
Ok [facility] Magic Mouthwash   150 ml     Swish and swallow 5 ml, three times daily

## 2016-08-04 ENCOUNTER — Ambulatory Visit (INDEPENDENT_AMBULATORY_CARE_PROVIDER_SITE_OTHER): Payer: BLUE CROSS/BLUE SHIELD | Admitting: *Deleted

## 2016-08-04 DIAGNOSIS — J309 Allergic rhinitis, unspecified: Secondary | ICD-10-CM

## 2016-08-10 ENCOUNTER — Ambulatory Visit (INDEPENDENT_AMBULATORY_CARE_PROVIDER_SITE_OTHER): Payer: BLUE CROSS/BLUE SHIELD | Admitting: *Deleted

## 2016-08-10 DIAGNOSIS — J309 Allergic rhinitis, unspecified: Secondary | ICD-10-CM | POA: Diagnosis not present

## 2016-08-17 ENCOUNTER — Ambulatory Visit (INDEPENDENT_AMBULATORY_CARE_PROVIDER_SITE_OTHER): Payer: BLUE CROSS/BLUE SHIELD | Admitting: *Deleted

## 2016-08-17 DIAGNOSIS — J309 Allergic rhinitis, unspecified: Secondary | ICD-10-CM | POA: Diagnosis not present

## 2016-08-18 ENCOUNTER — Telehealth: Payer: Self-pay | Admitting: Internal Medicine

## 2016-08-18 NOTE — Telephone Encounter (Signed)
Spoke with the pt  She states she was told by Tammy that she needed ov with CDY for more allergy serum to be made  She made appt for first available which is Feb 2018  She was last seen by CDY on 03/09/16  Please advise thanks

## 2016-08-19 NOTE — Telephone Encounter (Signed)
She was seen in May. Ok to refill vaccine and continue shots while the allergy lab is open.

## 2016-08-20 NOTE — Telephone Encounter (Signed)
LMTC x 1 for pt.  Let pt know this will be sent to [facility] for f/u

## 2016-08-21 NOTE — Telephone Encounter (Signed)
Called spoke with patient and advised her that Alroy Bailiff will be following up on this for her Pt okay with this and voiced her understanding

## 2016-08-24 ENCOUNTER — Ambulatory Visit (INDEPENDENT_AMBULATORY_CARE_PROVIDER_SITE_OTHER): Payer: BLUE CROSS/BLUE SHIELD

## 2016-08-24 DIAGNOSIS — J309 Allergic rhinitis, unspecified: Secondary | ICD-10-CM

## 2016-08-25 NOTE — Telephone Encounter (Signed)
Called pt. To let her know I will have her vac ready when she comes in for her shot this week. Nothing further needed.

## 2016-08-31 ENCOUNTER — Ambulatory Visit (INDEPENDENT_AMBULATORY_CARE_PROVIDER_SITE_OTHER): Payer: BLUE CROSS/BLUE SHIELD | Admitting: *Deleted

## 2016-08-31 DIAGNOSIS — J309 Allergic rhinitis, unspecified: Secondary | ICD-10-CM | POA: Diagnosis not present

## 2016-09-07 ENCOUNTER — Ambulatory Visit: Payer: BLUE CROSS/BLUE SHIELD

## 2016-09-09 ENCOUNTER — Ambulatory Visit (INDEPENDENT_AMBULATORY_CARE_PROVIDER_SITE_OTHER): Payer: BLUE CROSS/BLUE SHIELD | Admitting: *Deleted

## 2016-09-09 DIAGNOSIS — J309 Allergic rhinitis, unspecified: Secondary | ICD-10-CM

## 2016-09-14 ENCOUNTER — Ambulatory Visit (INDEPENDENT_AMBULATORY_CARE_PROVIDER_SITE_OTHER): Payer: BLUE CROSS/BLUE SHIELD | Admitting: *Deleted

## 2016-09-14 ENCOUNTER — Ambulatory Visit (INDEPENDENT_AMBULATORY_CARE_PROVIDER_SITE_OTHER): Payer: BLUE CROSS/BLUE SHIELD

## 2016-09-14 DIAGNOSIS — J309 Allergic rhinitis, unspecified: Secondary | ICD-10-CM

## 2016-09-14 DIAGNOSIS — Z23 Encounter for immunization: Secondary | ICD-10-CM

## 2016-09-21 ENCOUNTER — Ambulatory Visit (INDEPENDENT_AMBULATORY_CARE_PROVIDER_SITE_OTHER): Payer: BLUE CROSS/BLUE SHIELD | Admitting: *Deleted

## 2016-09-21 DIAGNOSIS — J309 Allergic rhinitis, unspecified: Secondary | ICD-10-CM | POA: Diagnosis not present

## 2016-09-22 ENCOUNTER — Telehealth: Payer: Self-pay | Admitting: Internal Medicine

## 2016-09-22 DIAGNOSIS — J309 Allergic rhinitis, unspecified: Secondary | ICD-10-CM | POA: Diagnosis not present

## 2016-09-22 NOTE — Telephone Encounter (Signed)
Allergy Serum Extract Date Mixed: 09/22/16 Vial: 1 Strength: 1:10 Here/Mail/Pick Up: here Mixed By: tbs Last OV: 03/09/16 Pending OV: 12/22/16

## 2016-09-28 ENCOUNTER — Ambulatory Visit (INDEPENDENT_AMBULATORY_CARE_PROVIDER_SITE_OTHER): Payer: BLUE CROSS/BLUE SHIELD

## 2016-09-28 DIAGNOSIS — J309 Allergic rhinitis, unspecified: Secondary | ICD-10-CM | POA: Diagnosis not present

## 2016-10-05 ENCOUNTER — Ambulatory Visit (INDEPENDENT_AMBULATORY_CARE_PROVIDER_SITE_OTHER): Payer: BLUE CROSS/BLUE SHIELD | Admitting: *Deleted

## 2016-10-05 DIAGNOSIS — J309 Allergic rhinitis, unspecified: Secondary | ICD-10-CM | POA: Diagnosis not present

## 2016-10-09 ENCOUNTER — Ambulatory Visit (INDEPENDENT_AMBULATORY_CARE_PROVIDER_SITE_OTHER): Payer: BLUE CROSS/BLUE SHIELD | Admitting: Physician Assistant

## 2016-10-09 VITALS — BP 122/72 | HR 79 | Temp 98.3°F | Resp 17 | Ht 61.5 in | Wt 206.0 lb

## 2016-10-09 DIAGNOSIS — J988 Other specified respiratory disorders: Secondary | ICD-10-CM

## 2016-10-09 DIAGNOSIS — B9789 Other viral agents as the cause of diseases classified elsewhere: Secondary | ICD-10-CM

## 2016-10-09 MED ORDER — GUAIFENESIN ER 1200 MG PO TB12: 1.0000 | ORAL_TABLET | Freq: Two times a day (BID) | ORAL | 1 refills | Status: DC | PRN

## 2016-10-09 MED ORDER — BENZONATATE 100 MG PO CAPS: 100.0000 mg | ORAL_CAPSULE | Freq: Three times a day (TID) | ORAL | 0 refills | Status: DC | PRN

## 2016-10-09 MED ORDER — HYDROCODONE-HOMATROPINE 5-1.5 MG/5ML PO SYRP: 5.0000 mL | ORAL_SOLUTION | Freq: Every evening | ORAL | 0 refills | Status: DC | PRN

## 2016-10-09 MED ORDER — PREDNISONE 20 MG PO TABS: ORAL_TABLET | ORAL | 0 refills | Status: DC

## 2016-10-09 NOTE — Patient Instructions (Addendum)
Hydrate well with 64 of water daily. Acute Bronchitis, Adult Acute bronchitis is when air tubes (bronchi) in the lungs suddenly get swollen. The condition can make it hard to breathe. It can also cause these symptoms:  A cough.  Coughing up clear, yellow, or green mucus.  Wheezing.  Chest congestion.  Shortness of breath.  A fever.  Body aches.  Chills.  A sore throat. Follow these instructions at home: Medicines  Take over-the-counter and prescription medicines only as told by your doctor.  If you were prescribed an antibiotic medicine, take it as told by your doctor. Do not stop taking the antibiotic even if you start to feel better. General instructions  Rest.  Drink enough fluids to keep your pee (urine) clear or pale yellow.  Avoid smoking and secondhand smoke. If you smoke and you need help quitting, ask your doctor. Quitting will help your lungs heal faster.  Use an inhaler, cool mist vaporizer, or humidifier as told by your doctor.  Keep all follow-up visits as told by your doctor. This is important. How is this prevented? To lower your risk of getting this condition again:  Wash your hands often with soap and water. If you cannot use soap and water, use hand sanitizer.  Avoid contact with people who have cold symptoms.  Try not to touch your hands to your mouth, nose, or eyes.  Make sure to get the flu shot every year. Contact a doctor if:  Your symptoms do not get better in 2 weeks. Get help right away if:  You cough up blood.  You have chest pain.  You have very bad shortness of breath.  You become dehydrated.  You faint (pass out) or keep feeling like you are going to pass out.  You keep throwing up (vomiting).  You have a very bad headache.  Your fever or chills gets worse. This information is not intended to replace advice given to you by your health care provider. Make sure you discuss any questions you have with your health care  provider. Document Released: 04/13/2008 Document Revised: 06/03/2016 Document Reviewed: 04/15/2016 Elsevier Interactive Patient Education  2017 [employer].    IF you received an x-ray today, you will receive an invoice from [facility]. Please contact [facility] at [phone] with questions or concerns regarding your invoice.   IF you received labwork today, you will receive an invoice from [employer]. Please contact [facility] at [phone] with questions or concerns regarding your invoice.   Our billing staff will not be able to assist you with questions regarding bills from these companies.  You will be contacted with the lab results as soon as they are available. The fastest way to get your results is to activate your My Chart account. Instructions are located on the last page of this paperwork. If you have not heard from [place] regarding the results in 2 weeks, please contact this office.

## 2016-10-09 NOTE — Progress Notes (Signed)
[facility] [address], [place] [address] [phone]  Date:  10/09/2016   Name:  [name]   DOB:  1950-12-24   MRN:  [id number]  PCP:  Laurey Morale, MD    History of Present Illness:  [name] is a 65 y.o. female patient who presents to [facility] for cc of cough.  Patient reports 3 days of coughing. She has had nasal congestion and sinus pressure. She was also encountered some wheezing. She has used her inhaler which has helped. She needed it 3 times yesterday while she was in physical therapy. She does not have a productive cough only once in the morning but then it stopped. The cough is worse at night. She has had no fever. She has used some Mucinex.     Patient Active Problem List   Diagnosis Date Noted  . Skin laceration 05/01/2016  . Laceration of lower lip 05/01/2016  . Nasal swelling 05/01/2016  . IBS (irritable bowel syndrome) 03/10/2016  . Allergic rhinitis due to pollen 01/12/2011  . Acute bronchitis 12/05/2008  . DEPRESSION 11/20/2008  . APHTHOUS ULCERS 03/15/2008  . DIZZINESS 02/08/2008  . ARM NUMBNESS 02/08/2008  . Headache(784.0) 02/08/2008  . Other acute sinusitis 10/25/2007  . CONCUSSION, <30 MIN LOC 08/08/2007  . DEPRESSION, ACUTE 06/15/2007  . HYPERTENSION 05/23/2007  . Mild intermittent asthma 05/23/2007  . GERD 05/23/2007    Past Medical History:  Diagnosis Date  . Allergy   . Asthma    sees Dr. Baird Lyons  . Colon polyp 2007  . Depression   . Diverticulosis   . GERD (gastroesophageal reflux disease)   . Gynecological examination    sees Dr. Paula Compton  . Hyperlipemia   . Hypertension     Past Surgical History:  Procedure Laterality Date  . CARPAL TUNNEL RELEASE Right 1980  . COLONOSCOPY  06-20-14   per Dr. Olevia Perches, adenomatous polyp,repeat in 5 yrs   . DILATION AND CURETTAGE OF UTERUS  2005    Social History  Substance Use Topics  . Smoking status: Never Smoker  . Smokeless tobacco:  Never Used  . Alcohol use 0.0 oz/week     Comment: occ    Family History  Problem Relation Age of Onset  . Colon cancer Mother 23  . Colon cancer Father 21  . Liver cancer Father   . Diabetes Sister   . Throat cancer Brother   . Colon cancer Maternal Aunt     not sure age of onset  . Colon cancer Maternal Aunt     not sure age of onset  . Pancreatic cancer Neg Hx   . Rectal cancer Neg Hx   . Stomach cancer Neg Hx     Allergies  Allergen Reactions  . Doxycycline     REACTION: jittery    Medication list has been reviewed and updated.  Current Outpatient Prescriptions on File Prior to Visit  Medication Sig Dispense Refill  . albuterol (PROAIR HFA) 108 (90 Base) MCG/ACT inhaler INHALE 2 PUFFS INTO THE LUNGS EVERY 6 HOURS AS NEEDED FOR WHEEZING. 3 Inhaler 3  . ANUCORT-HC 25 MG suppository PLACE 1 SUPPOSITORY RECTALLY AT BEDTIME (MUST KEEP COLONOSCOPY FOR FURTHER REFILLS) 12 suppository 11  . aspirin 81 MG tablet Take 81 mg by mouth daily.      . budesonide-formoterol (SYMBICORT) 80-4.5 MCG/ACT inhaler 2 puffs then rinse mouth, twice daily maintenance inhaler 3 Inhaler 3  . colestipol (COLESTID) 5 g  granules Take 5 g by mouth 2 (two) times daily. 500 g 5  . Diphenhyd-Hydrocort-Nystatin (FIRST-DUKES MOUTHWASH) SUSP Swish and swallow 33mLs three times daily 150 mL 0  . Flaxseed, Linseed, (FLAX SEED OIL) 1000 MG CAPS Take 1 capsule by mouth daily.      . fluticasone (FLONASE) 50 MCG/ACT nasal spray 1-2 sprays each nostril once or twice daily 48 g 3  . lidocaine (XYLOCAINE) 2 % solution Use small amount as needed every 2 hours 60 mL 0  . lisinopril-hydrochlorothiazide (PRINZIDE,ZESTORETIC) 20-12.5 MG tablet TAKE ONE TABLET BY MOUTH EVERY DAY 90 tablet 3  . NON FORMULARY Allergy vaccine. Once a week     . omeprazole (PRILOSEC) 20 MG capsule Take 1 capsule (20 mg total) by mouth daily. 90 capsule 3  . potassium chloride SA (K-DUR,KLOR-CON) 20 MEQ tablet Take 1 tablet (20 mEq total) by  mouth daily. 90 tablet 3  . pyridOXINE (VITAMIN B-6) 100 MG tablet Take 100 mg by mouth daily.      . sertraline (ZOLOFT) 100 MG tablet Take 1.5 tablets (150 mg total) by mouth daily. 135 tablet 3   No current facility-administered medications on file prior to visit.     ROS ROS otherwise unremarkable unless listed above.   Physical Examination: BP 122/72 (BP Location: Right Arm, Patient Position: Sitting, Cuff Size: Normal)   Pulse 79   Temp 98.3 F (36.8 C) (Oral)   Resp 17   Ht 5' 1.5" (1.562 m)   Wt 206 lb (93.4 kg)   SpO2 96%   BMI 38.29 kg/m  Ideal Body Weight: Weight in (lb) to have BMI = 25: 134.2  Physical Exam  Constitutional: She is oriented to person, place, and time. She appears well-developed and well-nourished. No distress.  HENT:  Head: Normocephalic and atraumatic.  Right Ear: Tympanic membrane, external ear and ear canal normal.  Left Ear: Tympanic membrane, external ear and ear canal normal.  Nose: Mucosal edema and rhinorrhea present. Right sinus exhibits no maxillary sinus tenderness and no frontal sinus tenderness. Left sinus exhibits no maxillary sinus tenderness and no frontal sinus tenderness.  Mouth/Throat: No uvula swelling. No oropharyngeal exudate, posterior oropharyngeal edema or posterior oropharyngeal erythema.  Eyes: Conjunctivae and EOM are normal. Pupils are equal, round, and reactive to light.  Cardiovascular: Normal rate and regular rhythm.  Exam reveals no gallop, no distant heart sounds and no friction rub.   No murmur heard. Pulmonary/Chest: Effort normal. No respiratory distress. She has no decreased breath sounds. She has no wheezes. She has no rhonchi.  Lymphadenopathy:       Head (right side): No submandibular, no tonsillar, no preauricular and no posterior auricular adenopathy present.       Head (left side): No submandibular, no tonsillar, no preauricular and no posterior auricular adenopathy present.  Neurological: She is alert and  oriented to person, place, and time.  Skin: She is not diaphoretic.  Psychiatric: She has a normal mood and affect. Her behavior is normal.     Assessment and Plan: [name] is a 65 y.o. female who is here today for chief complaint of coughing and congestion for 3 days. This appears to be viral. Possible bronchitis. I'm giving her prednisone short tapering dose to help with the bronchospasms. Also given supportive treatment at this time.  If she has no improvement in 5 days, please prescribe azithromycin in form of zpak. Viral respiratory infection - Plan: HYDROcodone-homatropine (HYCODAN) 5-1.5 MG/5ML syrup, benzonatate (TESSALON) 100 MG capsule,  Guaifenesin (MUCINEX MAXIMUM STRENGTH) 1200 MG TB12, predniSONE (DELTASONE) 20 MG tablet  Ivar Drape, PA-C [facility] 12/1/20179:59 AM

## 2016-10-12 ENCOUNTER — Ambulatory Visit (INDEPENDENT_AMBULATORY_CARE_PROVIDER_SITE_OTHER): Payer: BLUE CROSS/BLUE SHIELD | Admitting: *Deleted

## 2016-10-12 DIAGNOSIS — J309 Allergic rhinitis, unspecified: Secondary | ICD-10-CM | POA: Diagnosis not present

## 2016-10-19 ENCOUNTER — Ambulatory Visit (INDEPENDENT_AMBULATORY_CARE_PROVIDER_SITE_OTHER): Payer: BLUE CROSS/BLUE SHIELD | Admitting: *Deleted

## 2016-10-19 DIAGNOSIS — J309 Allergic rhinitis, unspecified: Secondary | ICD-10-CM

## 2016-10-26 ENCOUNTER — Ambulatory Visit (INDEPENDENT_AMBULATORY_CARE_PROVIDER_SITE_OTHER): Payer: BLUE CROSS/BLUE SHIELD | Admitting: *Deleted

## 2016-10-26 DIAGNOSIS — J309 Allergic rhinitis, unspecified: Secondary | ICD-10-CM

## 2016-11-03 ENCOUNTER — Ambulatory Visit (INDEPENDENT_AMBULATORY_CARE_PROVIDER_SITE_OTHER): Payer: BLUE CROSS/BLUE SHIELD | Admitting: *Deleted

## 2016-11-03 DIAGNOSIS — J309 Allergic rhinitis, unspecified: Secondary | ICD-10-CM | POA: Diagnosis not present

## 2016-12-03 ENCOUNTER — Ambulatory Visit (INDEPENDENT_AMBULATORY_CARE_PROVIDER_SITE_OTHER): Payer: BLUE CROSS/BLUE SHIELD | Admitting: Allergy

## 2016-12-03 ENCOUNTER — Encounter: Payer: Self-pay | Admitting: Allergy

## 2016-12-03 VITALS — BP 130/58 | HR 64 | Temp 97.6°F | Resp 16 | Ht 60.25 in | Wt 203.8 lb

## 2016-12-03 DIAGNOSIS — J3089 Other allergic rhinitis: Secondary | ICD-10-CM

## 2016-12-03 DIAGNOSIS — J4520 Mild intermittent asthma, uncomplicated: Secondary | ICD-10-CM

## 2016-12-03 DIAGNOSIS — K219 Gastro-esophageal reflux disease without esophagitis: Secondary | ICD-10-CM

## 2016-12-03 MED ORDER — MONTELUKAST SODIUM 10 MG PO TABS: 10.0000 mg | ORAL_TABLET | Freq: Every day | ORAL | 5 refills | Status: DC

## 2016-12-03 MED ORDER — EPINEPHRINE 0.3 MG/0.3ML IJ SOAJ: INTRAMUSCULAR | 1 refills | Status: AC

## 2016-12-03 NOTE — Patient Instructions (Addendum)
Allergic rhinitis      -   We will obtain environmental allergy panel to determine what you still allergic to.    If this testing is negative then I will advise you come in for skin testing.   We will place your allergy shots on our testing to make new valves which she would have completed throughout from Dr. Annamaria Boots.     Allergy shot consent form information completed and provided today.     - We will provide you with an EpiPen to bring on the day of your allergy shots.     - Continue loratadine 10 mg daily.      - Start Singulair 10 mg take at bedtime     - Continue Flonase 2 sprays each nostril daily  Asthma   - Continue Symbicort 53mcg 2 puffs daily.   During asthma flares increase this to 2 puffs twice a day   - Continue albuterol inhaler 2 puffs every 4-6 hours as needed for cough, wheeze, shortness of breath Asthma control goals:   Full participation in all desired activities (may need albuterol before activity)  Albuterol use two time or less a week on average (not counting use with activity)  Cough interfering with sleep two time or less a month  Oral steroids no more than once a year  No hospitalizations   Reflux  -Continue Prilosec   follow-up in 3-4 months daily

## 2016-12-03 NOTE — Progress Notes (Signed)
New Patient Note  RE: [name] MRN: [id number] DOB: 07/19/1951 Date of Office Visit: 12/03/2016  Referring provider: Deneise Lever, MD Primary care provider: Alysia Penna, MD  Chief Complaint: Resume allergy shots  History of present illness: [name] is a 66 y.o. female presenting today for consultation for allergen immunotherapy as well as asthma care.  She is a former patient of Dr. Annamaria Boots and was receiving allergen immunotherapy through his office. He is no longer providing this service and she seeks to transfer her care.  She has been on allergy shots for a long time (she does not remember when she started).  She has been on going weekly.  Her last shot was about a month ago now.  She feels that she has noticed a difference in her nasal symptoms being off the allergy shots. She feels more congested.  She reports she tried stopping about a month last year when she found out Dr. Annamaria Boots was no longer going to be doing allergy shots but she had to go back on them due to symptoms.  She denies any local or systemic reactions to her shots. She did bring in her vial that she had remaining from Dr. Janee Morn office.  She reports her last allergy testing was many years ago.  She is on Loratidine daily (which she took yesterday) and Flonase 2 spray which she takes at night.      She has asthma.  She feels her asthma symptoms are controlled at this time.  She reports if she goes outside and starts walking she does get winded after about 1.5 blocks.  She takes Symbicort 2 puffs once a day in the morning.   She uses albuterol infequently.  She did have an asthma flare and went to [facility] and received prednisone in December 2017.  She denies any nighttime awakenings.  She does take Prilosec for her reflux which controls her symptoms.  She received flu shot and pneumonia vaccine last year.    She broke her nose (also her knee) in June 2017 from a fall at work and is currnently on [employer].  She does plan to see ENT but does not have an appointment yet to evaluate her nose.    Review of systems: Review of Systems  Constitutional: Negative for chills, fever and malaise/fatigue.  HENT: Positive for congestion. Negative for ear pain, nosebleeds, sinus pain and sore throat.   Eyes: Negative for discharge and redness.  Respiratory: Positive for shortness of breath. Negative for cough and wheezing.   Cardiovascular: Negative for chest pain and palpitations.  Gastrointestinal: Positive for heartburn. Negative for abdominal pain, nausea and vomiting.  Skin: Negative for itching and rash.    All other systems negative unless noted above in HPI  Past medical history: Past Medical History:  Diagnosis Date  . Allergy   . Asthma    sees Dr. Baird Lyons  . Colon polyp 2007  . Depression   . Diverticulosis   . GERD (gastroesophageal reflux disease)   . Gynecological examination    sees Dr. Paula Compton  . Hyperlipemia   . Hypertension     Past surgical history: Past Surgical History:  Procedure Laterality Date  . CARPAL TUNNEL RELEASE Right 1980  . COLONOSCOPY  06-20-14   per Dr. Olevia Perches, adenomatous polyp,repeat in 5 yrs   . DILATION AND CURETTAGE OF UTERUS  2005    Family history:  Family History  Problem Relation Age of Onset  .  Colon cancer Mother 70  . Asthma Mother   . Colon cancer Father 91  . Liver cancer Father   . Throat cancer Brother   . Colon cancer Maternal Aunt     not sure age of onset  . Colon cancer Maternal Aunt     not sure age of onset  . Diabetes Sister   . Pancreatic cancer Neg Hx   . Rectal cancer Neg Hx   . Stomach cancer Neg Hx   . Allergic rhinitis Neg Hx   . Angioedema Neg Hx   . Eczema Neg Hx     Social history: She lives in a home with carpeting in the bedroom with central cooling. There is a dog in the home. There is no concern for water damage or mildew roaches in the home. She works as a Publishing copy.  .  Smoking status: Never Smoker   Medication List: Allergies as of 12/03/2016      Reactions   Doxycycline    REACTION: jittery      Medication List       Accurate as of 12/03/16  1:42 PM. Always use your most recent med list.          albuterol 108 (90 Base) MCG/ACT inhaler Commonly known as:  PROAIR HFA INHALE 2 PUFFS INTO THE LUNGS EVERY 6 HOURS AS NEEDED FOR WHEEZING.   ANUCORT-HC 25 MG suppository Generic drug:  hydrocortisone PLACE 1 SUPPOSITORY RECTALLY AT BEDTIME (MUST KEEP COLONOSCOPY FOR FURTHER REFILLS)   aspirin 81 MG tablet Take 81 mg by mouth daily.   benzonatate 100 MG capsule Commonly known as:  TESSALON Take 1-2 capsules (100-200 mg total) by mouth 3 (three) times daily as needed for cough.   budesonide-formoterol 80-4.5 MCG/ACT inhaler Commonly known as:  SYMBICORT 2 puffs then rinse mouth, twice daily maintenance inhaler   colestipol 5 g granules Commonly known as:  COLESTID Take 5 g by mouth 2 (two) times daily.   FIRST-DUKES MOUTHWASH Susp Swish and swallow 66mLs three times daily   Flax Seed Oil 1000 MG Caps Take 1 capsule by mouth daily.   fluticasone 50 MCG/ACT nasal spray Commonly known as:  FLONASE 1-2 sprays each nostril once or twice daily   Guaifenesin 1200 MG Tb12 Commonly known as:  MUCINEX MAXIMUM STRENGTH Take 1 tablet (1,200 mg total) by mouth every 12 (twelve) hours as needed.   HYDROcodone-homatropine 5-1.5 MG/5ML syrup Commonly known as:  HYCODAN Take 5 mLs by mouth at bedtime as needed for cough.   lidocaine 2 % solution Commonly known as:  XYLOCAINE Use small amount as needed every 2 hours   lisinopril-hydrochlorothiazide 20-12.5 MG tablet Commonly known as:  PRINZIDE,ZESTORETIC TAKE ONE TABLET BY MOUTH EVERY DAY   montelukast 10 MG tablet Commonly known as:  SINGULAIR Take 1 tablet (10 mg total) by mouth at bedtime.   NON FORMULARY Allergy vaccine. Once a week   omeprazole 20 MG capsule Commonly known as:   PRILOSEC Take 1 capsule (20 mg total) by mouth daily.   potassium chloride SA 20 MEQ tablet Commonly known as:  K-DUR,KLOR-CON Take 1 tablet (20 mEq total) by mouth daily.   pyridOXINE 100 MG tablet Commonly known as:  VITAMIN B-6 Take 100 mg by mouth daily.   sertraline 100 MG tablet Commonly known as:  ZOLOFT Take 1.5 tablets (150 mg total) by mouth daily.       Known medication allergies: Allergies  Allergen Reactions  . Doxycycline  REACTION: jittery     Physical examination: Blood pressure (!) 130/58, pulse 64, temperature 97.6 F (36.4 C), temperature source Oral, resp. rate 16, height 5' 0.25" (1.53 m), weight 203 lb 12.8 oz (92.4 kg).  General: Alert, interactive, in no acute distress. HEENT: TMs pearly gray, turbinates moderately edematous without discharge, post-pharynx non erythematous. Neck: Supple without lymphadenopathy. Lungs: Clear to auscultation without wheezing, rhonchi or rales. {no increased work of breathing. CV: Normal S1, S2 without murmurs. Abdomen: Nondistended, nontender. Skin: Warm and dry, without lesions or rashes. Extremities:  No clubbing, cyanosis or edema. Neuro:   Grossly intact.  Diagnositics/Labs: Spirometry: FEV1: 2.26L  112%, FVC: 2.7L  108%, ratio consistent with Nonobstructive pattern  Allergy testing: Deferred due to recent antihistamine use   Assessment and plan:   Allergic rhinitis      -   We will obtain environmental allergy panel to determine what you still allergic to.    If this testing is negative then I will advise you come in for skin testing.   We will place your allergy shots on our testing to make new valves which she would have completed throughout from Dr. Annamaria Boots.     Allergy shot consent form information completed and provided today.     - We will provide you with an EpiPen to bring on the day of your allergy shots.     - Continue loratadine 10 mg daily.       - Start Singulair 10 mg take at bedtime     -  Continue Flonase 2 sprays each nostril daily  Asthma, Mild intermittent   - Continue Symbicort 71mcg 2 puffs daily.   During asthma flares increase this to 2 puffs twice a day   - Continue albuterol inhaler 2 puffs every 4-6 hours as needed for cough, wheeze, shortness of breath Asthma control goals:   Full participation in all desired activities (may need albuterol before activity)  Albuterol use two time or less a week on average (not counting use with activity)  Cough interfering with sleep two time or less a month  Oral steroids no more than once a year  No hospitalizations   Reflux  -Continue Prilosec   follow-up in 3-4 months daily  I appreciate the opportunity to take part in [place]'s care. Please do not hesitate to contact me with questions.  Sincerely,   Prudy Feeler, MD Allergy/Immunology [facility]

## 2016-12-08 LAB — ALLERGENS, ZONE 3
Aspergillus Fumigatus IgE: <0.1 kU/L
Bahia Grass IgE: <0.1 kU/L
Bermuda Grass IgE: <0.1 kU/L
Cedar, Mountain IgE: <0.1 kU/L
Cladosporium Herbarum IgE: <0.1 kU/L
Cockroach, American IgE: <0.1 kU/L
Common Silver Birch IgE: <0.1 kU/L
D Pteronyssinus IgE: <0.1 kU/L
Dog Dander IgE: <0.1 kU/L
Johnson Grass IgE: <0.1 kU/L
Maple/Box Elder IgE: <0.1 kU/L
Mucor Racemosus IgE: <0.1 kU/L
Ragweed, Short IgE: <0.1 kU/L
Stemphylium Herbarum IgE: <0.1 kU/L
White Mulberry IgE: <0.1 kU/L

## 2016-12-09 ENCOUNTER — Ambulatory Visit (INDEPENDENT_AMBULATORY_CARE_PROVIDER_SITE_OTHER): Payer: BLUE CROSS/BLUE SHIELD

## 2016-12-09 DIAGNOSIS — J309 Allergic rhinitis, unspecified: Secondary | ICD-10-CM

## 2016-12-17 ENCOUNTER — Ambulatory Visit (INDEPENDENT_AMBULATORY_CARE_PROVIDER_SITE_OTHER): Payer: BLUE CROSS/BLUE SHIELD

## 2016-12-17 DIAGNOSIS — J309 Allergic rhinitis, unspecified: Secondary | ICD-10-CM | POA: Diagnosis not present

## 2016-12-22 ENCOUNTER — Ambulatory Visit: Payer: BLUE CROSS/BLUE SHIELD | Admitting: Internal Medicine

## 2016-12-24 ENCOUNTER — Ambulatory Visit (INDEPENDENT_AMBULATORY_CARE_PROVIDER_SITE_OTHER): Payer: BLUE CROSS/BLUE SHIELD | Admitting: *Deleted

## 2016-12-24 DIAGNOSIS — J309 Allergic rhinitis, unspecified: Secondary | ICD-10-CM | POA: Diagnosis not present

## 2016-12-28 ENCOUNTER — Other Ambulatory Visit (INDEPENDENT_AMBULATORY_CARE_PROVIDER_SITE_OTHER): Payer: BLUE CROSS/BLUE SHIELD

## 2016-12-28 ENCOUNTER — Telehealth: Payer: Self-pay | Admitting: Family Medicine

## 2016-12-28 DIAGNOSIS — Z0000 Encounter for general adult medical examination without abnormal findings: Secondary | ICD-10-CM | POA: Diagnosis not present

## 2016-12-28 LAB — BASIC METABOLIC PANEL
BUN: 13 mg/dL (ref 6–23)
CALCIUM: 9 mg/dL (ref 8.4–10.5)
CO2: 26 mEq/L (ref 19–32)
Chloride: 109 mEq/L (ref 96–112)
Creatinine, Ser: 0.82 mg/dL (ref 0.40–1.20)
GFR: 74.63 mL/min (ref >60.00)
Glucose, Bld: 96 mg/dL (ref 70–99)
POTASSIUM: 4 meq/L (ref 3.5–5.1)
SODIUM: 143 meq/L (ref 135–145)

## 2016-12-28 LAB — HEPATIC FUNCTION PANEL
ALBUMIN: 3.9 g/dL (ref 3.5–5.2)
ALK PHOS: 61 U/L (ref 39–117)
ALT: 23 U/L (ref 0–35)
AST: 20 U/L (ref 0–37)
Bilirubin, Direct: 0.1 mg/dL (ref 0.0–0.3)
TOTAL PROTEIN: 6 g/dL (ref 6.0–8.3)
Total Bilirubin: 0.4 mg/dL (ref 0.2–1.2)

## 2016-12-28 LAB — POC URINALSYSI DIPSTICK (AUTOMATED)
BILIRUBIN UA: NEGATIVE
Glucose, UA: NEGATIVE
Ketones, UA: NEGATIVE
NITRITE UA: NEGATIVE
PH UA: 7
RBC UA: NEGATIVE
Spec Grav, UA: 1.02
Urobilinogen, UA: 0.2

## 2016-12-28 LAB — LIPID PANEL
Cholesterol: 204 mg/dL — ABNORMAL HIGH (ref 0–200)
HDL: 45.3 mg/dL (ref >39.00)
LDL Cholesterol: 121 mg/dL — ABNORMAL HIGH (ref 0–99)
NONHDL: 158.54
Total CHOL/HDL Ratio: 4
Triglycerides: 190 mg/dL — ABNORMAL HIGH (ref 0.0–149.0)
VLDL: 38 mg/dL (ref 0.0–40.0)

## 2016-12-28 LAB — CBC WITH DIFFERENTIAL/PLATELET
BASOS ABS: 0.1 10*3/uL (ref 0.0–0.1)
Basophils Relative: 1 % (ref 0.0–3.0)
Eosinophils Absolute: 0.2 10*3/uL (ref 0.0–0.7)
Eosinophils Relative: 2.8 % (ref 0.0–5.0)
HEMATOCRIT: 34.8 % — AB (ref 36.0–46.0)
Hemoglobin: 11.7 g/dL — ABNORMAL LOW (ref 12.0–15.0)
LYMPHS PCT: 24.3 % (ref 12.0–46.0)
Lymphs Abs: 1.7 10*3/uL (ref 0.7–4.0)
MCHC: 33.7 g/dL (ref 30.0–36.0)
MCV: 88.2 fl (ref 78.0–100.0)
MONOS PCT: 4.9 % (ref 3.0–12.0)
Monocytes Absolute: 0.3 10*3/uL (ref 0.1–1.0)
NEUTROS PCT: 67 % (ref 43.0–77.0)
Neutro Abs: 4.8 10*3/uL (ref 1.4–7.7)
Platelets: 211 10*3/uL (ref 150.0–400.0)
RBC: 3.95 Mil/uL (ref 3.87–5.11)
RDW: 14.2 % (ref 11.5–15.5)
WBC: 7.1 10*3/uL (ref 4.0–10.5)

## 2016-12-28 LAB — TSH: TSH: 1.69 u[IU]/mL (ref 0.35–4.50)

## 2016-12-28 NOTE — Telephone Encounter (Signed)
Patient is out of medications but doesn't see Dr Sarajane Jews until next week.  Patient needs the following medications refilled:  Potassium Lisinopril Omeprazole  [facility] [address]

## 2016-12-29 ENCOUNTER — Encounter: Payer: Self-pay | Admitting: Family Medicine

## 2016-12-29 ENCOUNTER — Ambulatory Visit (INDEPENDENT_AMBULATORY_CARE_PROVIDER_SITE_OTHER): Payer: BLUE CROSS/BLUE SHIELD | Admitting: *Deleted

## 2016-12-29 ENCOUNTER — Ambulatory Visit (INDEPENDENT_AMBULATORY_CARE_PROVIDER_SITE_OTHER): Payer: BLUE CROSS/BLUE SHIELD | Admitting: Family Medicine

## 2016-12-29 VITALS — BP 138/76 | HR 75 | Temp 98.3°F | Ht 60.25 in | Wt 204.8 lb

## 2016-12-29 DIAGNOSIS — J309 Allergic rhinitis, unspecified: Secondary | ICD-10-CM

## 2016-12-29 DIAGNOSIS — M79671 Pain in right foot: Secondary | ICD-10-CM

## 2016-12-29 DIAGNOSIS — J988 Other specified respiratory disorders: Secondary | ICD-10-CM | POA: Diagnosis not present

## 2016-12-29 DIAGNOSIS — Z9109 Other allergy status, other than to drugs and biological substances: Secondary | ICD-10-CM

## 2016-12-29 DIAGNOSIS — J209 Acute bronchitis, unspecified: Secondary | ICD-10-CM

## 2016-12-29 DIAGNOSIS — B9789 Other viral agents as the cause of diseases classified elsewhere: Secondary | ICD-10-CM

## 2016-12-29 MED ORDER — HYDROCODONE-HOMATROPINE 5-1.5 MG/5ML PO SYRP: 5.0000 mL | ORAL_SOLUTION | ORAL | 0 refills | Status: DC | PRN

## 2016-12-29 MED ORDER — METHYLPREDNISOLONE ACETATE 80 MG/ML IJ SUSP
120.0000 mg | Freq: Once | INTRAMUSCULAR | Status: AC
  Administered 2016-12-29: 120 mg via INTRAMUSCULAR

## 2016-12-29 MED ORDER — OMEPRAZOLE 20 MG PO CPDR: 20.0000 mg | DELAYED_RELEASE_CAPSULE | Freq: Every day | ORAL | 3 refills | Status: DC

## 2016-12-29 MED ORDER — POTASSIUM CHLORIDE CRYS ER 20 MEQ PO TBCR: 20.0000 meq | EXTENDED_RELEASE_TABLET | Freq: Every day | ORAL | 3 refills | Status: DC

## 2016-12-29 MED ORDER — AZITHROMYCIN 250 MG PO TABS: ORAL_TABLET | ORAL | 0 refills | Status: DC

## 2016-12-29 MED ORDER — LISINOPRIL-HYDROCHLOROTHIAZIDE 20-12.5 MG PO TABS: ORAL_TABLET | ORAL | 3 refills | Status: DC

## 2016-12-29 NOTE — Telephone Encounter (Signed)
Ok to refill all meds listed below? Please advise -Paty

## 2016-12-29 NOTE — Telephone Encounter (Signed)
All 3 were sent in  

## 2016-12-29 NOTE — Progress Notes (Signed)
   Subjective:    Patient ID: [name], female    DOB: 1951/01/11, 66 y.o.   MRN: [id number]  HPI Here for several reasons. First she started coughing 4 days ago and her chest is tight. The cough produces yellow sputum. No fever. Also she is unhappy at her current allergy office and she wants to try another one. Last she has had pain in the right foot for about 2 months. No hx of trauma. No redness or swelling.    Review of Systems  Constitutional: Negative.   HENT: Negative.   Eyes: Negative.   Respiratory: Positive for cough, chest tightness, shortness of breath and wheezing.   Cardiovascular: Negative.   Musculoskeletal: Positive for arthralgias.       Objective:   Physical Exam  Constitutional: She is oriented to person, place, and time. She appears well-developed and well-nourished.  HENT:  Right Ear: External ear normal.  Left Ear: External ear normal.  Nose: Nose normal.  Mouth/Throat: Oropharynx is clear and moist.  Eyes: Conjunctivae are normal.  Neck: No thyromegaly present.  Pulmonary/Chest: Effort normal. No respiratory distress. She has no wheezes. She has no rales.  Scattered rhonchi   Musculoskeletal:  The right foot is very tender between the first and second MTP joints   Lymphadenopathy:    She has no cervical adenopathy.  Neurological: She is alert and oriented to person, place, and time.          Assessment & Plan:  Treat the bronchitis with a Zpack and a steroid shot. Refer to [place] [facility] for the allergies. 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  Alysia Penna, MD

## 2016-12-29 NOTE — Progress Notes (Signed)
Pre visit review using our clinic review tool, if applicable. No additional management support is needed unless otherwise documented below in the visit note. 

## 2017-01-04 ENCOUNTER — Ambulatory Visit (INDEPENDENT_AMBULATORY_CARE_PROVIDER_SITE_OTHER): Payer: BLUE CROSS/BLUE SHIELD | Admitting: Family Medicine

## 2017-01-04 ENCOUNTER — Encounter: Payer: Self-pay | Admitting: Family Medicine

## 2017-01-04 VITALS — BP 130/86 | HR 70 | Temp 98.1°F | Ht 60.25 in | Wt 204.0 lb

## 2017-01-04 DIAGNOSIS — Z0000 Encounter for general adult medical examination without abnormal findings: Secondary | ICD-10-CM

## 2017-01-04 NOTE — Progress Notes (Signed)
Pre visit review using our clinic review tool, if applicable. No additional management support is needed unless otherwise documented below in the visit note. 

## 2017-01-04 NOTE — Progress Notes (Signed)
   Subjective:    Patient ID: RAMI FARNUM, female    DOB: [birth year], 66 y.o.   MRN: [id number]  HPI 66 yr old female for a well exam. She is doing well. She was recently treated for a bronchitis and this has resolved.    Review of Systems  Constitutional: Negative.   HENT: Negative.   Eyes: Negative.   Respiratory: Negative.   Cardiovascular: Negative.   Gastrointestinal: Negative.   Genitourinary: Negative for decreased urine volume, difficulty urinating, dyspareunia, dysuria, enuresis, flank pain, frequency, hematuria, pelvic pain and urgency.  Musculoskeletal: Negative.   Skin: Negative.   Neurological: Negative.   Psychiatric/Behavioral: Negative.        Objective:   Physical Exam  Constitutional: She is oriented to person, place, and time. She appears well-developed and well-nourished. No distress.  HENT:  Head: Normocephalic and atraumatic.  Right Ear: External ear normal.  Left Ear: External ear normal.  Nose: Nose normal.  Mouth/Throat: Oropharynx is clear and moist. No oropharyngeal exudate.  Eyes: Conjunctivae and EOM are normal. Pupils are equal, round, and reactive to light. No scleral icterus.  Neck: Normal range of motion. Neck supple. No JVD present. No thyromegaly present.  Cardiovascular: Normal rate, regular rhythm, normal heart sounds and intact distal pulses.  Exam reveals no gallop and no friction rub.   No murmur heard. Pulmonary/Chest: Effort normal and breath sounds normal. No respiratory distress. She has no wheezes. She has no rales. She exhibits no tenderness.  Abdominal: Soft. Bowel sounds are normal. She exhibits no distension and no mass. There is no tenderness. There is no rebound and no guarding.  Musculoskeletal: Normal range of motion. She exhibits no edema or tenderness.  Lymphadenopathy:    She has no cervical adenopathy.  Neurological: She is alert and oriented to person, place, and time. She has normal reflexes. No cranial nerve  deficit. She exhibits normal muscle tone. Coordination normal.  Skin: Skin is warm and dry. No rash noted. No erythema.  Psychiatric: She has a normal mood and affect. Her behavior is normal. Judgment and thought content normal.          Assessment & Plan:  Well exam. We discussed diet and exercise.  Alysia Penna, MD

## 2017-01-07 ENCOUNTER — Ambulatory Visit (INDEPENDENT_AMBULATORY_CARE_PROVIDER_SITE_OTHER): Payer: BLUE CROSS/BLUE SHIELD | Admitting: *Deleted

## 2017-01-07 DIAGNOSIS — J309 Allergic rhinitis, unspecified: Secondary | ICD-10-CM

## 2017-01-08 ENCOUNTER — Ambulatory Visit (INDEPENDENT_AMBULATORY_CARE_PROVIDER_SITE_OTHER): Payer: BLUE CROSS/BLUE SHIELD

## 2017-01-08 ENCOUNTER — Encounter: Payer: Self-pay | Admitting: Podiatry

## 2017-01-08 ENCOUNTER — Ambulatory Visit (INDEPENDENT_AMBULATORY_CARE_PROVIDER_SITE_OTHER): Payer: BLUE CROSS/BLUE SHIELD | Admitting: Podiatry

## 2017-01-08 ENCOUNTER — Ambulatory Visit: Payer: No Typology Code available for payment source

## 2017-01-08 DIAGNOSIS — M778 Other enthesopathies, not elsewhere classified: Secondary | ICD-10-CM

## 2017-01-08 DIAGNOSIS — M79672 Pain in left foot: Secondary | ICD-10-CM

## 2017-01-08 DIAGNOSIS — M79671 Pain in right foot: Secondary | ICD-10-CM

## 2017-01-08 DIAGNOSIS — M7751 Other enthesopathy of right foot: Secondary | ICD-10-CM | POA: Diagnosis not present

## 2017-01-08 DIAGNOSIS — M779 Enthesopathy, unspecified: Secondary | ICD-10-CM | POA: Diagnosis not present

## 2017-01-08 MED ORDER — TRIAMCINOLONE ACETONIDE 10 MG/ML IJ SUSP
10.0000 mg | Freq: Once | INTRAMUSCULAR | Status: AC
  Administered 2017-01-08: 10 mg

## 2017-01-08 NOTE — Progress Notes (Signed)
   Subjective:    Patient ID: [name], female    DOB: 16-Nov-1950, 66 y.o.   MRN: [id number]  HPI  Chief Complaint  Patient presents with  . Foot Pain    Right; Plantar Forefoot; beneath great toe x 4 months. Pt states that she feels a knot in the area and it hurts to walk especially in the morning.  Left; Arch x "for a long time"   . Nail Problem    Right; 3rd, 4th and 5th toes; Nails are thick.        Review of Systems     Objective:   Physical Exam        Assessment & Plan:

## 2017-01-10 NOTE — Progress Notes (Signed)
Subjective:     Patient ID: [name], female   DOB: 1951/10/05, 66 y.o.   MRN: [id number]  HPI patient presents beneath her right foot stating that she's had pain around this area and inflammation. States that she has trouble bearing weight down on this area if she's on it too long   Review of Systems  All other systems reviewed and are negative.      Objective:   Physical Exam  Constitutional: She is oriented to person, place, and time.  Cardiovascular: Intact distal pulses.   Musculoskeletal: Normal range of motion.  Neurological: She is oriented to person, place, and time.  Skin: Skin is warm.  Nursing note and vitals reviewed.  neurovascular status intact muscle strength was adequate range of motion within normal limits with patient noted to have discomfort around the plantar aspect first metatarsal head right with fluid buildup and a plantarflexed metatarsal. Does not appear to be specific sesamoidal injury but appears to be more related to just the structure. 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 Applied dancer's pad with instructions on usage  X-ray report indicated that there is no signs of sesamoidal injury currently with plantarflexed metatarsal

## 2017-01-14 ENCOUNTER — Ambulatory Visit (INDEPENDENT_AMBULATORY_CARE_PROVIDER_SITE_OTHER): Payer: BLUE CROSS/BLUE SHIELD

## 2017-01-14 DIAGNOSIS — J309 Allergic rhinitis, unspecified: Secondary | ICD-10-CM | POA: Diagnosis not present

## 2017-01-25 ENCOUNTER — Ambulatory Visit (INDEPENDENT_AMBULATORY_CARE_PROVIDER_SITE_OTHER): Payer: BLUE CROSS/BLUE SHIELD | Admitting: Podiatry

## 2017-01-25 ENCOUNTER — Ambulatory Visit: Payer: No Typology Code available for payment source | Admitting: Allergy

## 2017-01-25 ENCOUNTER — Encounter: Payer: Self-pay | Admitting: Podiatry

## 2017-01-25 DIAGNOSIS — M779 Enthesopathy, unspecified: Secondary | ICD-10-CM

## 2017-01-27 NOTE — Progress Notes (Signed)
Subjective:     Patient ID: [name], female   DOB: [birth year], 66 y.o.   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 Patient is casted for this in a functional position and will receive these when ready

## 2017-02-01 ENCOUNTER — Telehealth: Payer: Self-pay | Admitting: Family Medicine

## 2017-02-01 DIAGNOSIS — J988 Other specified respiratory disorders: Principal | ICD-10-CM

## 2017-02-01 DIAGNOSIS — B9789 Other viral agents as the cause of diseases classified elsewhere: Secondary | ICD-10-CM

## 2017-02-01 MED ORDER — HYDROCODONE-HOMATROPINE 5-1.5 MG/5ML PO SYRP: 5.0000 mL | ORAL_SOLUTION | ORAL | 0 refills | Status: DC | PRN

## 2017-02-01 NOTE — Telephone Encounter (Signed)
° ° °  Pt call to ask if she can get a refill on the below med   HYDROcodone-homatropine (HYCODAN) 5-1.5 MG/5ML syrup

## 2017-02-01 NOTE — Telephone Encounter (Signed)
done

## 2017-02-01 NOTE — Telephone Encounter (Signed)
Script is ready for pick up here at front office and tried to reach pt and no answer.

## 2017-02-17 ENCOUNTER — Other Ambulatory Visit: Payer: BLUE CROSS/BLUE SHIELD

## 2017-02-28 IMAGING — DX DG KNEE COMPLETE 4+V*R*
4 series · 4 of 4 positions shown · non-contrast
Comparison: None.

CLINICAL DATA: Recent right knee fall with right knee pain.

EXAM:
RIGHT KNEE - COMPLETE 4+ VIEW

[knee ap]
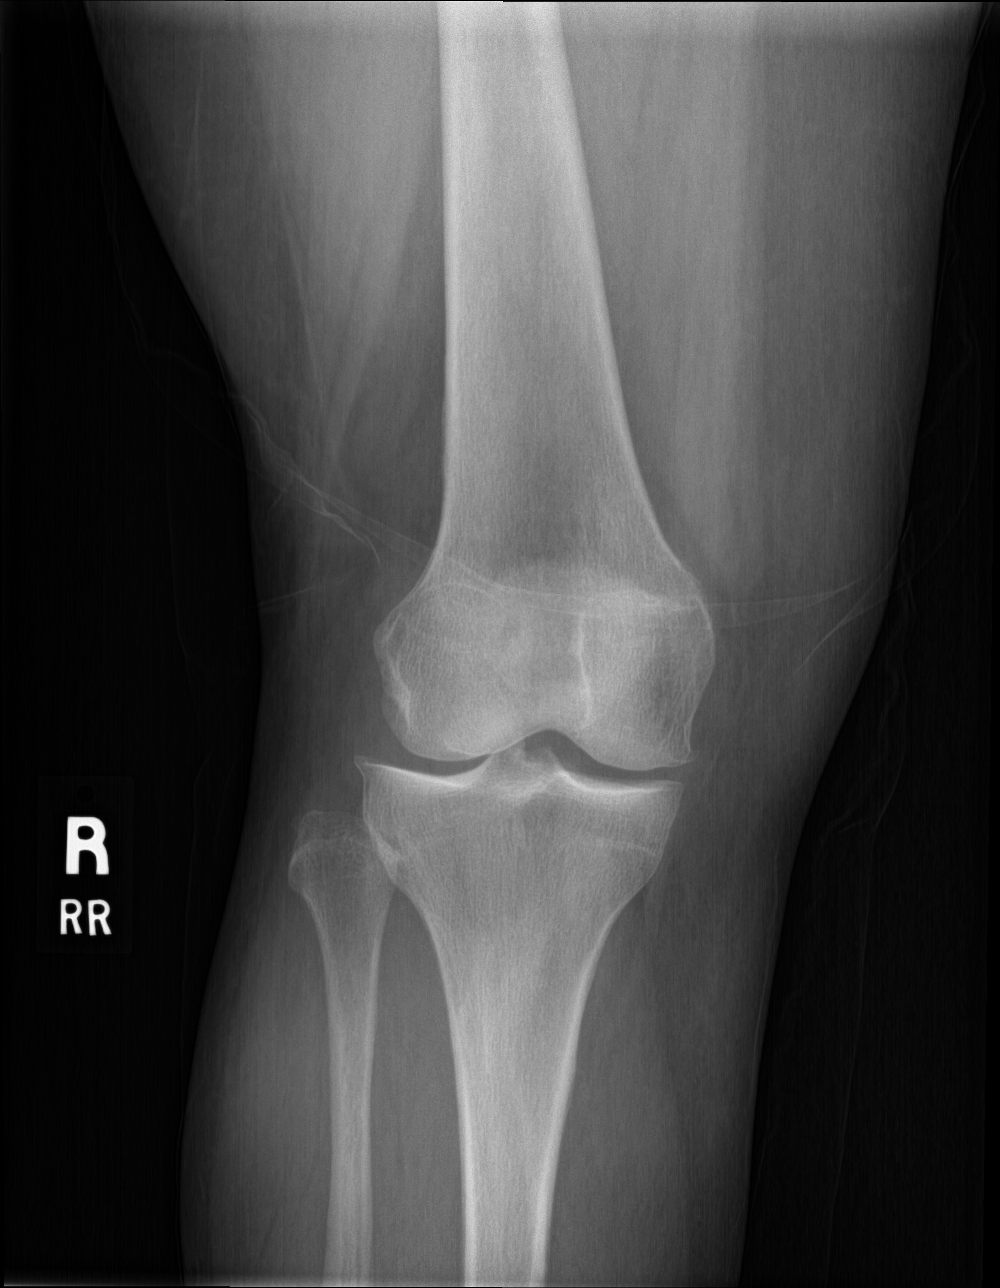

[knee obl (1 of 2)]
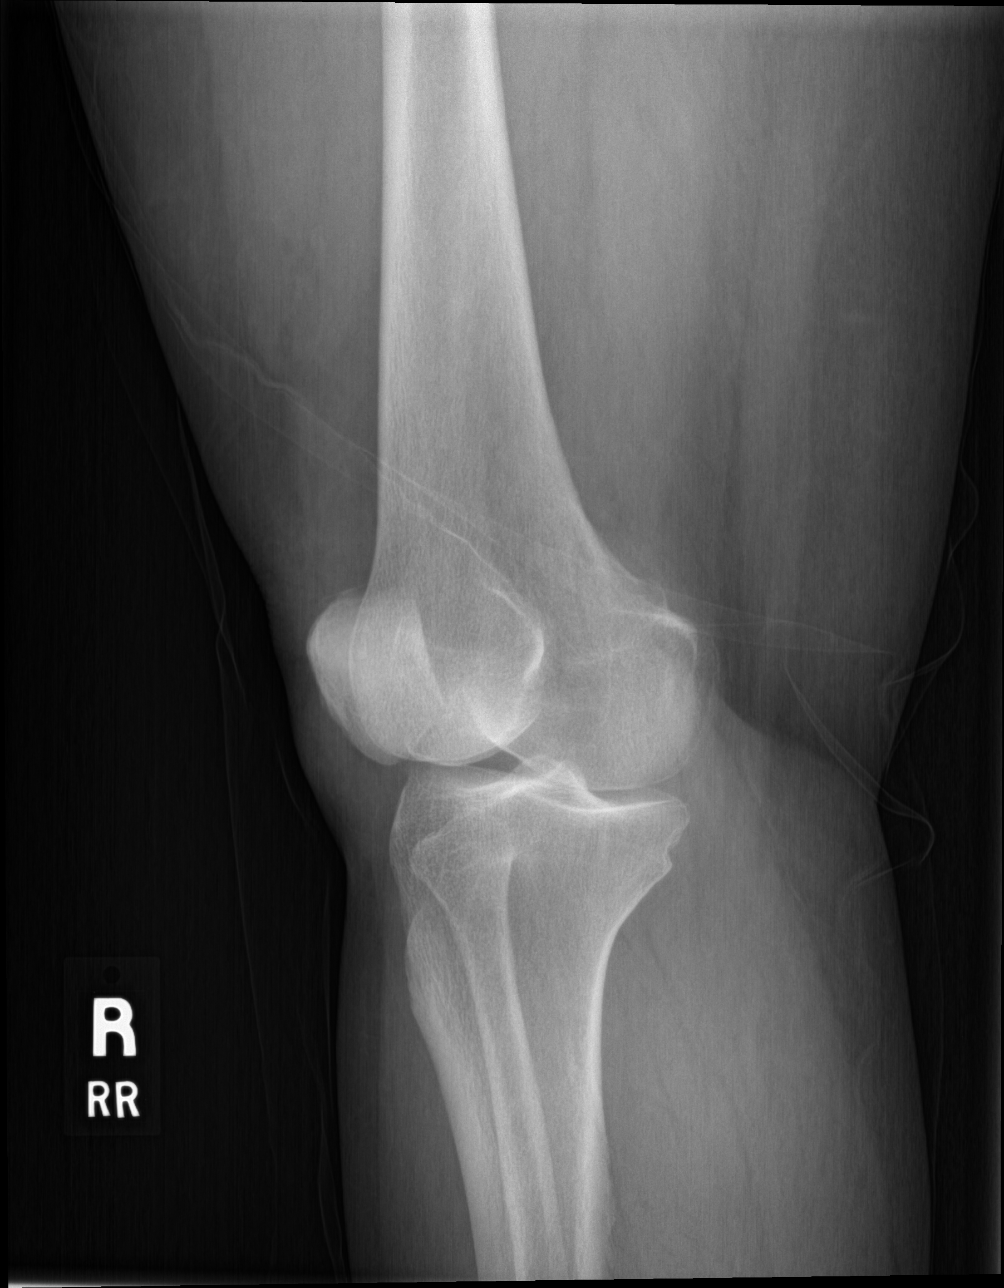

[knee obl (2 of 2)]
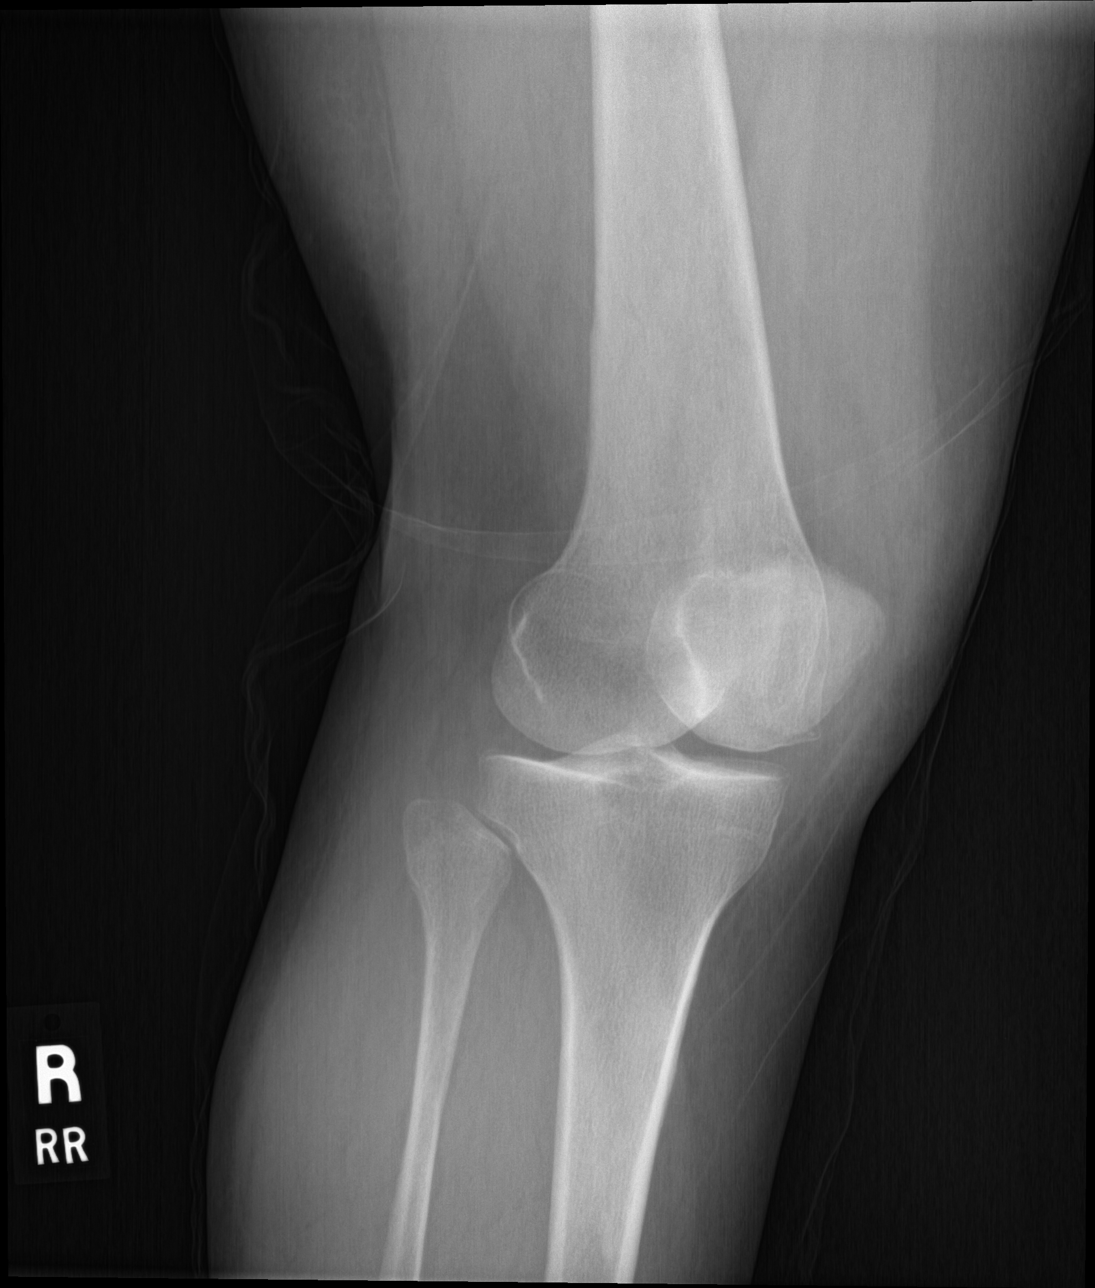

[knee lat]
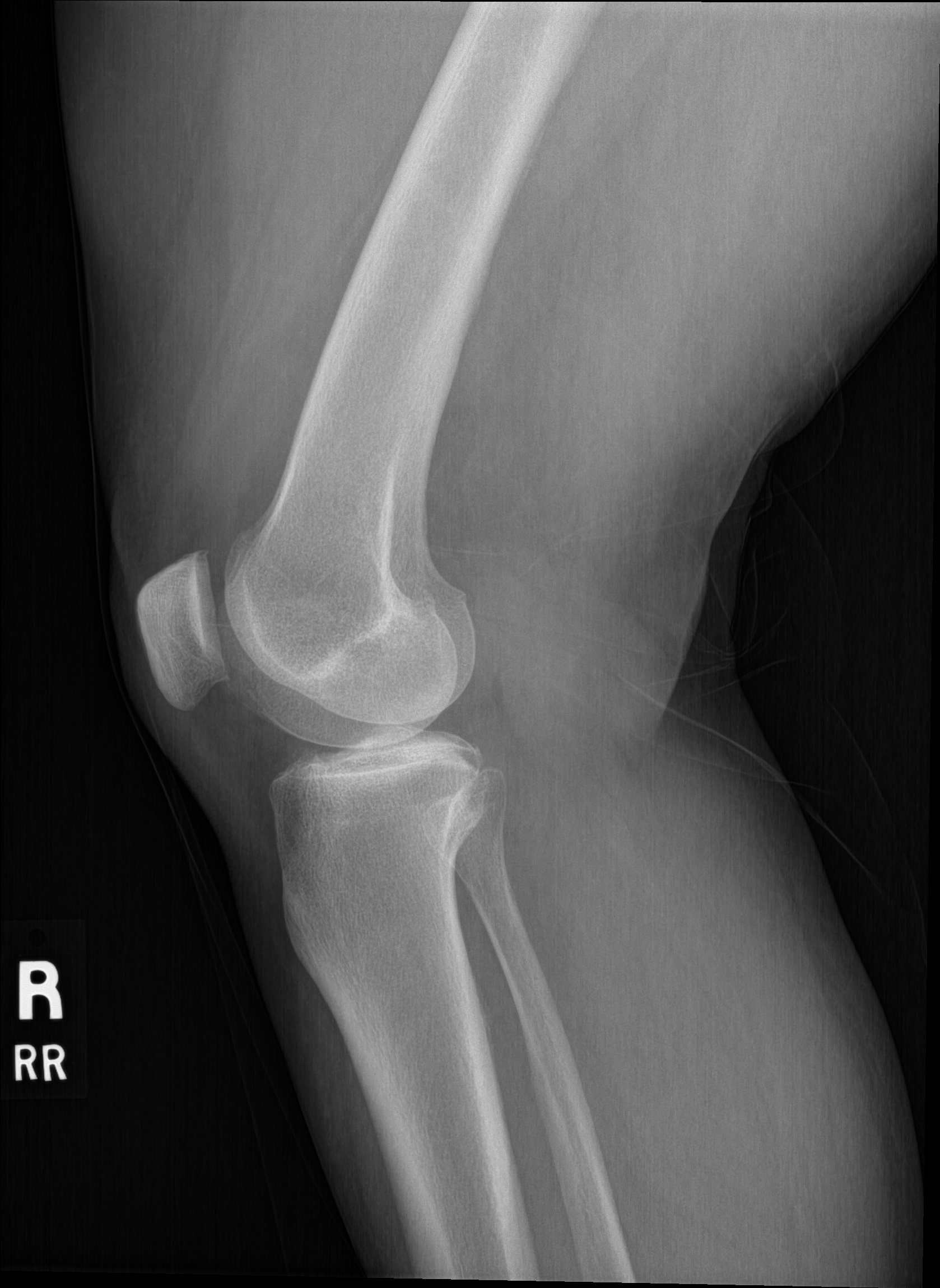

[4 of 4 positions shown; findings below may reference images not displayed]

FINDINGS: No fracture, dislocation or suspicious focal osseous lesion. Trace
suprapatellar right knee joint effusion. Minimal tricompartmental
osteoarthritis.
IMPRESSION: No fracture or malalignment. Minimal tricompartmental right knee
osteoarthritis with trace suprapatellar right knee joint effusion.

## 2017-02-28 IMAGING — DX DG HAND 2V*R*
2 series · 2 of 2 positions shown · non-contrast
Comparison: None.

CLINICAL DATA: Right hand injury from a fall 2 weeks ago

EXAM:
RIGHT HAND - 2 VIEW

[hand pa]
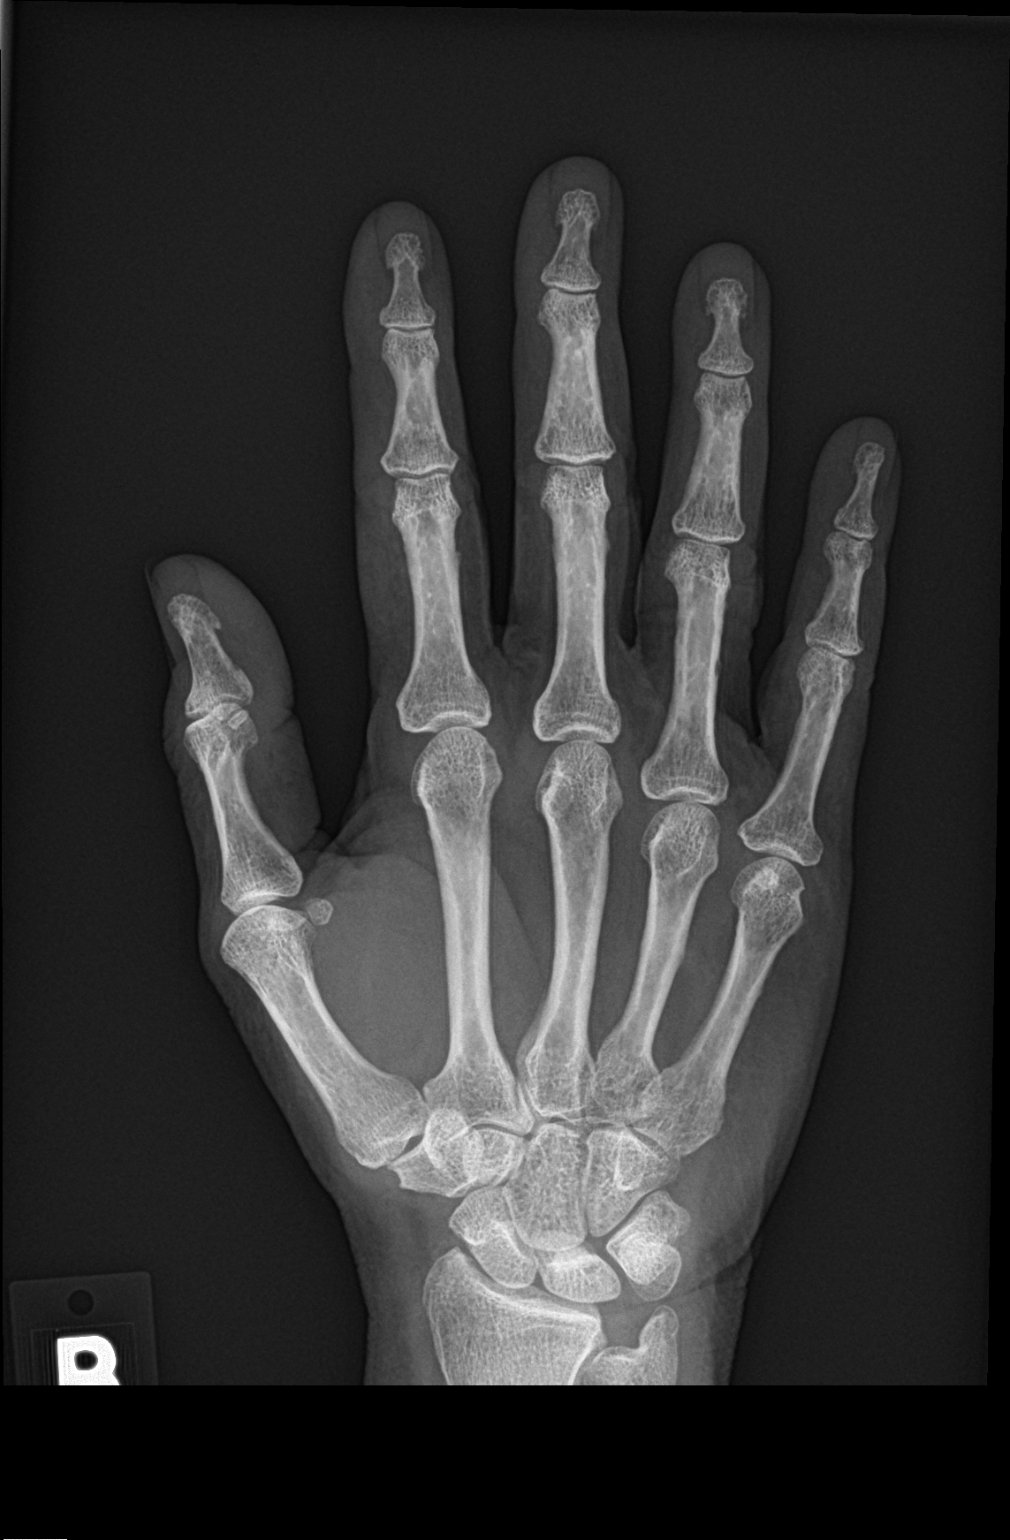

[hand lat]
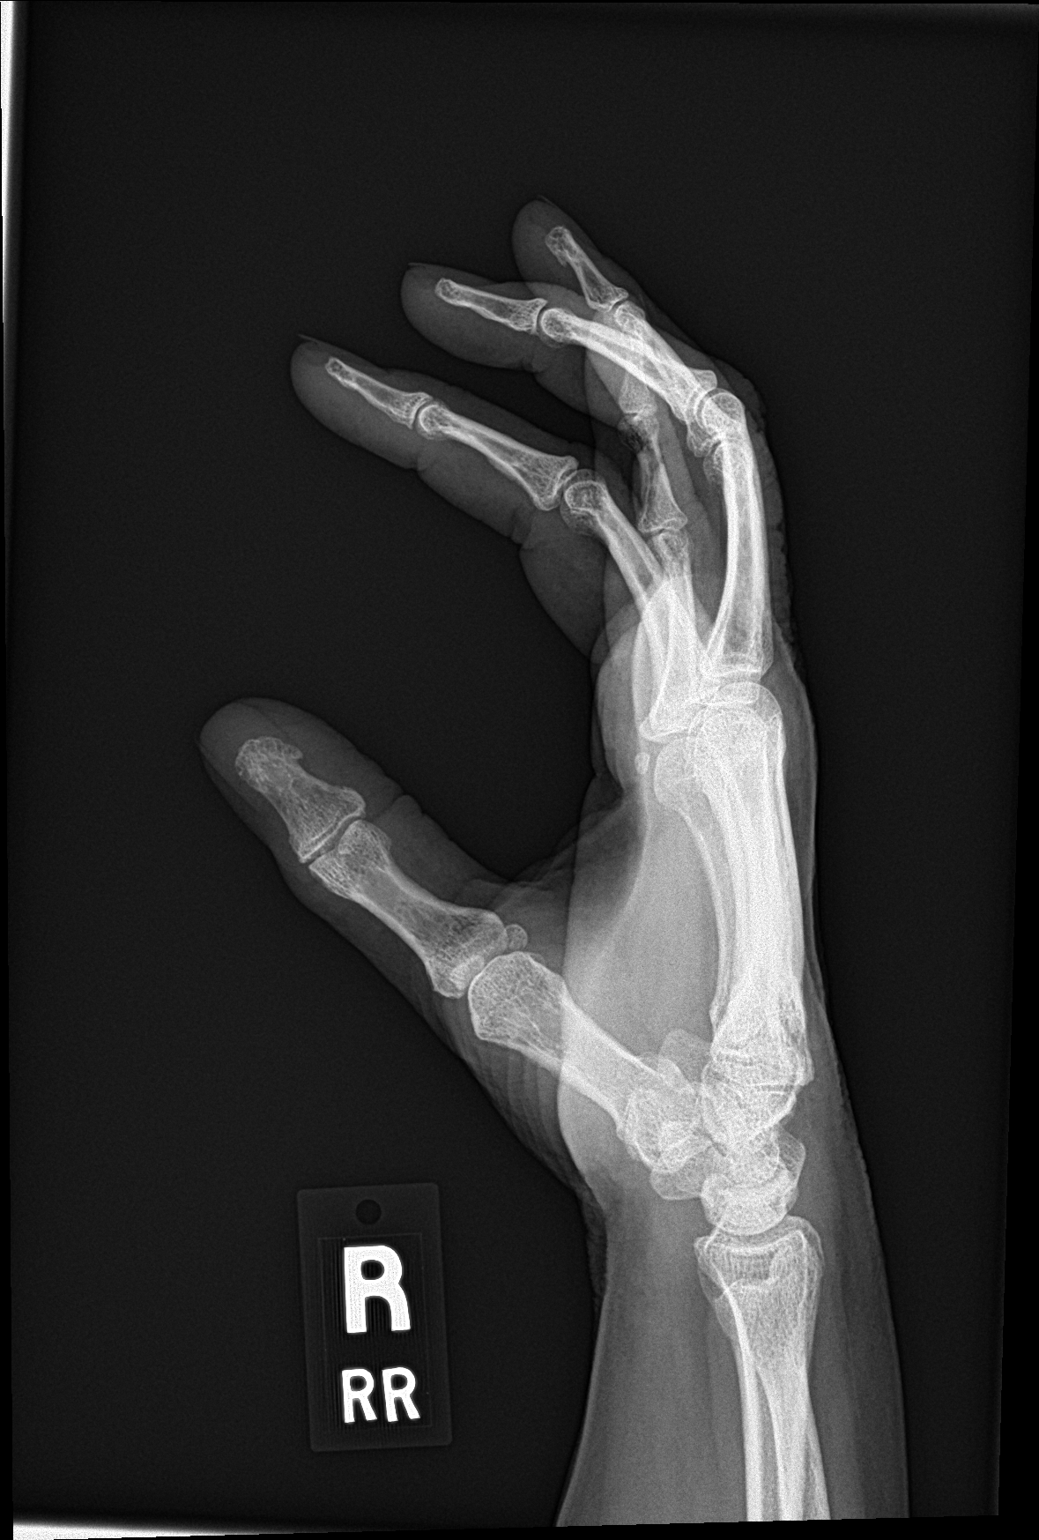

[2 of 2 positions shown; findings below may reference images not displayed]

FINDINGS: AP and lateral views. Overlap of fingers on the lateral view. Ulnar
minus variance. No acute fracture or dislocation. No definite soft
tissue swelling.
IMPRESSION: No acute osseous abnormality.

Decrease sensitivity exam, secondary lack of oblique image and
suboptimal positioning on the lateral view.

## 2017-03-03 ENCOUNTER — Ambulatory Visit: Payer: Self-pay | Admitting: Allergy

## 2017-03-24 ENCOUNTER — Other Ambulatory Visit: Payer: Self-pay

## 2017-03-24 NOTE — Telephone Encounter (Signed)
Received fax for a 90 day supply for Montelukast. I denied it. Patient was last seen 12/03/2016. Patient was asked to come back in 3/4 months. Patient canceled on 01/25/17,and on 03/03/17. Patient needs office visit for further refills.

## 2017-03-29 ENCOUNTER — Telehealth: Payer: Self-pay

## 2017-03-29 NOTE — Telephone Encounter (Signed)
Patient called to report that she was planting flowers about a week and a half ago and was bitten by ants. She is not sure if they were fire ants. She has small, red, itchy and blistered bumps on her feet, hands and face. She does take loratadine daily. Advised her she can add in Benadryl and OTC hydrocortisone/calamine lotion to help with itching. Try not to scratch. Advised her to contact office if areas not improving or worsen. Nothing further needed at this time.

## 2017-04-06 ENCOUNTER — Other Ambulatory Visit: Payer: Self-pay

## 2017-04-06 MED ORDER — MONTELUKAST SODIUM 10 MG PO TABS: 10.0000 mg | ORAL_TABLET | Freq: Every day | ORAL | 0 refills | Status: DC

## 2017-04-16 ENCOUNTER — Other Ambulatory Visit: Payer: Self-pay

## 2017-05-13 ENCOUNTER — Telehealth: Payer: Self-pay | Admitting: *Deleted

## 2017-05-13 NOTE — Telephone Encounter (Signed)
Patient would like a return call from billing.

## 2017-05-13 NOTE — Telephone Encounter (Signed)
Left message to call me back.

## 2017-05-18 NOTE — Telephone Encounter (Signed)
Called pt again - explained that the balance of $5.69 was for an inj on 01-14-17 & [facility] didn't pay until 05-06-17 - she will pay next week - kt

## 2017-06-09 ENCOUNTER — Encounter: Payer: Self-pay | Admitting: Family Medicine

## 2017-11-19 ENCOUNTER — Other Ambulatory Visit: Payer: Self-pay | Admitting: Family Medicine

## 2017-11-19 NOTE — Telephone Encounter (Signed)
Last CPE 01/04/2017  Omeprazole last refilled 12/29/2016 disp 90 with 3 refills  Potassium last refilled 12/29/2016 disp 90 with 3 refills  Sent to PCP for approval for potassium refill  Thanks

## 2017-12-01 ENCOUNTER — Ambulatory Visit (INDEPENDENT_AMBULATORY_CARE_PROVIDER_SITE_OTHER): Payer: BLUE CROSS/BLUE SHIELD | Admitting: Family Medicine

## 2017-12-01 ENCOUNTER — Encounter: Payer: Self-pay | Admitting: Family Medicine

## 2017-12-01 VITALS — BP 114/68 | HR 63 | Temp 97.9°F | Wt 196.6 lb

## 2017-12-01 DIAGNOSIS — R531 Weakness: Secondary | ICD-10-CM

## 2017-12-01 DIAGNOSIS — K069 Disorder of gingiva and edentulous alveolar ridge, unspecified: Secondary | ICD-10-CM | POA: Diagnosis not present

## 2017-12-01 DIAGNOSIS — K121 Other forms of stomatitis: Secondary | ICD-10-CM

## 2017-12-01 LAB — BASIC METABOLIC PANEL
BUN: 10 mg/dL (ref 6–23)
CALCIUM: 9.5 mg/dL (ref 8.4–10.5)
CO2: 25 meq/L (ref 19–32)
CREATININE: 0.91 mg/dL (ref 0.40–1.20)
Chloride: 108 mEq/L (ref 96–112)
GFR: 65.99 mL/min (ref >60.00)
GLUCOSE: 95 mg/dL (ref 70–99)
Potassium: 4.2 mEq/L (ref 3.5–5.1)
Sodium: 145 mEq/L (ref 135–145)

## 2017-12-01 LAB — CBC WITH DIFFERENTIAL/PLATELET
BASOS ABS: 0.1 10*3/uL (ref 0.0–0.1)
Basophils Relative: 0.9 % (ref 0.0–3.0)
EOS ABS: 0.2 10*3/uL (ref 0.0–0.7)
Eosinophils Relative: 2.4 % (ref 0.0–5.0)
HEMATOCRIT: 38.6 % (ref 36.0–46.0)
Hemoglobin: 12.7 g/dL (ref 12.0–15.0)
LYMPHS PCT: 26.6 % (ref 12.0–46.0)
Lymphs Abs: 2 10*3/uL (ref 0.7–4.0)
MCHC: 32.9 g/dL (ref 30.0–36.0)
MCV: 90.6 fl (ref 78.0–100.0)
MONO ABS: 0.4 10*3/uL (ref 0.1–1.0)
Monocytes Relative: 5.2 % (ref 3.0–12.0)
Neutro Abs: 5 10*3/uL (ref 1.4–7.7)
Neutrophils Relative %: 64.9 % (ref 43.0–77.0)
Platelets: 243 10*3/uL (ref 150.0–400.0)
RBC: 4.26 Mil/uL (ref 3.87–5.11)
RDW: 14.5 % (ref 11.5–15.5)
WBC: 7.6 10*3/uL (ref 4.0–10.5)

## 2017-12-01 LAB — HEPATIC FUNCTION PANEL
ALBUMIN: 4.1 g/dL (ref 3.5–5.2)
ALT: 25 U/L (ref 0–35)
AST: 17 U/L (ref 0–37)
Alkaline Phosphatase: 64 U/L (ref 39–117)
Bilirubin, Direct: 0.1 mg/dL (ref 0.0–0.3)
TOTAL PROTEIN: 6.6 g/dL (ref 6.0–8.3)
Total Bilirubin: 0.4 mg/dL (ref 0.2–1.2)

## 2017-12-01 LAB — TSH: TSH: 1.03 u[IU]/mL (ref 0.35–4.50)

## 2017-12-01 NOTE — Progress Notes (Signed)
   Subjective:    Patient ID: [name], female    DOB: 1951/04/18, 67 y.o.   MRN: [id number]  HPI Here for several issues. First she saw a [facility] on 11-24-17 for mouth and gum pain. She was found to have gum disease and thrush, and she was given Clindamycin for 8 days and Nystatin. Her mouth feels better today but she wants me to check it, and she describes a general feeling of weakness for the past month. She cannot be more specific but she has "no energy".    Review of Systems  Constitutional: Positive for fatigue. Negative for fever.  HENT: Positive for dental problem. Negative for congestion, postnasal drip, sinus pressure, sinus pain and sore throat.   Eyes: Negative.   Respiratory: Negative.   Cardiovascular: Negative.   Gastrointestinal: Negative.   Genitourinary: Negative.        Objective:   Physical Exam  Constitutional: She is oriented to person, place, and time. She appears well-developed and well-nourished.  HENT:  Right Ear: External ear normal.  Left Ear: External ear normal.  Nose: Nose normal.  She has four anterior lower teeth that are broken off with a lot of caries. The gum itself looks clear and is not tender   Eyes: Conjunctivae are normal.  Neck: No thyromegaly present.  Cardiovascular: Normal rate, regular rhythm, normal heart sounds and intact distal pulses.  Pulmonary/Chest: Effort normal and breath sounds normal. No respiratory distress. She has no wheezes. She has no rales.  Lymphadenopathy:    She has no cervical adenopathy.  Neurological: She is alert and oriented to person, place, and time.          Assessment & Plan:  Her gum disease and thrush are improved. She will finish out the Clindamycin and Nystatin. Her HTN I stable. For the weakness we will screen with some labs today. Alysia Penna, MD

## 2017-12-07 ENCOUNTER — Telehealth: Payer: Self-pay | Admitting: Family Medicine

## 2017-12-07 NOTE — Telephone Encounter (Signed)
Called pt and left a VM that all lab results came back normal.

## 2017-12-07 NOTE — Telephone Encounter (Signed)
Copied from [place] [id number]. Topic: Quick Communication - See Telephone Encounter >> Dec 07, 2017 11:32 AM Bea Graff, NT wrote: CRM for notification. See Telephone encounter for: Pt would like a call with her lab results.  12/07/17.

## 2017-12-09 ENCOUNTER — Other Ambulatory Visit: Payer: Self-pay

## 2017-12-09 ENCOUNTER — Telehealth: Payer: Self-pay | Admitting: Family Medicine

## 2017-12-09 MED ORDER — LISINOPRIL-HYDROCHLOROTHIAZIDE 20-12.5 MG PO TABS: ORAL_TABLET | ORAL | 2 refills | Status: DC

## 2017-12-09 NOTE — Telephone Encounter (Signed)
Copied from [place] [phone]. Topic: Quick Communication - See Telephone Encounter >> Dec 09, 2017  1:22 PM Ether Griffins B wrote: CRM for notification. See Telephone encounter for:  Pt having sinus issues x a several days and has tried OTC meds and they arent working she is hoping something can be called in for her.  12/09/17.

## 2017-12-10 NOTE — Telephone Encounter (Signed)
Sent to PCP ?

## 2017-12-13 MED ORDER — AZITHROMYCIN 250 MG PO TABS: ORAL_TABLET | ORAL | 0 refills | Status: DC

## 2017-12-13 NOTE — Telephone Encounter (Signed)
Pt called to check on the status of this,  Pt states no one has called her

## 2017-12-13 NOTE — Telephone Encounter (Signed)
Call in a Zpack  ?

## 2017-12-13 NOTE — Telephone Encounter (Signed)
[facility]. Called pt and left a VM Rx has been sent into pharmacy.

## 2017-12-14 ENCOUNTER — Ambulatory Visit (INDEPENDENT_AMBULATORY_CARE_PROVIDER_SITE_OTHER): Payer: BLUE CROSS/BLUE SHIELD | Admitting: Family Medicine

## 2017-12-14 ENCOUNTER — Encounter: Payer: Self-pay | Admitting: Family Medicine

## 2017-12-14 VITALS — BP 102/66 | HR 84 | Temp 98.5°F | Wt 197.8 lb

## 2017-12-14 DIAGNOSIS — J988 Other specified respiratory disorders: Secondary | ICD-10-CM

## 2017-12-14 DIAGNOSIS — J018 Other acute sinusitis: Secondary | ICD-10-CM

## 2017-12-14 DIAGNOSIS — B9789 Other viral agents as the cause of diseases classified elsewhere: Secondary | ICD-10-CM

## 2017-12-14 MED ORDER — HYDROCODONE-HOMATROPINE 5-1.5 MG/5ML PO SYRP: 5.0000 mL | ORAL_SOLUTION | ORAL | 0 refills | Status: DC | PRN

## 2017-12-14 MED ORDER — METHYLPREDNISOLONE ACETATE 80 MG/ML IJ SUSP
120.0000 mg | Freq: Once | INTRAMUSCULAR | Status: AC
  Administered 2017-12-14: 120 mg via INTRAMUSCULAR

## 2017-12-14 NOTE — Addendum Note (Signed)
Addended by: Myriam Forehand on: 12/14/2017 12:18 PM   Modules accepted: Orders

## 2017-12-14 NOTE — Progress Notes (Signed)
   Subjective:    Patient ID: [name], female    DOB: 25-Mar-1951, 67 y.o.   MRN: [id number]  HPI Here for one week of sinus pressure, PND, and coughing up green sputum. No fever. She started on a Zpack last night and she is taking Mucinex DM.    Review of Systems  Constitutional: Negative.   HENT: Positive for congestion, postnasal drip, sinus pressure, sinus pain and sore throat.   Eyes: Negative.   Respiratory: Positive for cough.        Objective:   Physical Exam  Constitutional: She appears well-developed and well-nourished.  HENT:  Right Ear: External ear normal.  Left Ear: External ear normal.  Nose: Nose normal.  Mouth/Throat: Oropharynx is clear and moist.  Eyes: Conjunctivae are normal.  Neck: No thyromegaly present.  Pulmonary/Chest: Effort normal and breath sounds normal. No respiratory distress. She has no wheezes. She has no rales.  Lymphadenopathy:    She has no cervical adenopathy.          Assessment & Plan:  Sinusitis, she will finish the Zpack. Given a steroid shot and cough syrup.  Alysia Penna, MD

## 2017-12-20 ENCOUNTER — Telehealth: Payer: Self-pay | Admitting: Family Medicine

## 2017-12-20 NOTE — Telephone Encounter (Signed)
Copied from [place]. Topic: Quick Communication - Rx Refill/Question >> Dec 20, 2017  9:56 AM Oliver Pila B wrote: Medication: lisinopril-hydrochlorothiazide (PRINZIDE,ZESTORETIC) 20-12.5 MG tablet [[id number]] , reference # [id number]    Has the patient contacted their pharmacy? Yes.     (Agent: If no, request that the patient contact the pharmacy for the refill.)   Preferred Pharmacy (with phone number or street name): [facility]: Please be advised that RX refills may take up to 3 business days. We ask that you follow-up with your pharmacy.

## 2017-12-20 NOTE — Telephone Encounter (Signed)
Last OV 12/14/2017   Last refilled 12/09/2017 disp 90 with 2 [facility]ills [facility]   Will call pt medication has been filled tell pt to check in with [facility]

## 2017-12-20 NOTE — Telephone Encounter (Signed)
Copied from [place]. Topic: Quick Communication - Rx Refill/Question >> Dec 20, 2017  9:56 AM Oliver Pila B wrote: Medication: lisinopril-hydrochlorothiazide (PRINZIDE,ZESTORETIC) 20-12.5 MG tablet [[id number]] , reference # [id number]    Has the patient contacted their pharmacy? Yes.     (Agent: If no, request that the patient contact the pharmacy for the refill.)   Preferred Pharmacy (with phone number or street name): [facility]: Please be advised that RX refills may take up to 3 business days. We ask that you follow-up with your pharmacy.

## 2017-12-22 NOTE — Telephone Encounter (Signed)
Called pt and left a VM that Rx was sent to [facility] on 12/09/2017

## 2018-02-07 DIAGNOSIS — J3089 Other allergic rhinitis: Secondary | ICD-10-CM | POA: Diagnosis not present

## 2018-02-16 DIAGNOSIS — J3089 Other allergic rhinitis: Secondary | ICD-10-CM | POA: Diagnosis not present

## 2018-02-24 DIAGNOSIS — J3089 Other allergic rhinitis: Secondary | ICD-10-CM | POA: Diagnosis not present

## 2018-03-02 DIAGNOSIS — J3089 Other allergic rhinitis: Secondary | ICD-10-CM | POA: Diagnosis not present

## 2018-03-04 DIAGNOSIS — J3089 Other allergic rhinitis: Secondary | ICD-10-CM | POA: Diagnosis not present

## 2018-03-09 DIAGNOSIS — J3089 Other allergic rhinitis: Secondary | ICD-10-CM | POA: Diagnosis not present

## 2018-03-09 DIAGNOSIS — J301 Allergic rhinitis due to pollen: Secondary | ICD-10-CM | POA: Diagnosis not present

## 2018-03-15 DIAGNOSIS — J3089 Other allergic rhinitis: Secondary | ICD-10-CM | POA: Diagnosis not present

## 2018-03-21 ENCOUNTER — Ambulatory Visit (INDEPENDENT_AMBULATORY_CARE_PROVIDER_SITE_OTHER): Payer: Medicare HMO | Admitting: Family Medicine

## 2018-03-21 ENCOUNTER — Encounter: Payer: Self-pay | Admitting: Family Medicine

## 2018-03-21 ENCOUNTER — Other Ambulatory Visit: Payer: Self-pay

## 2018-03-21 VITALS — BP 112/64 | HR 83 | Temp 98.2°F | Ht 61.0 in | Wt 206.8 lb

## 2018-03-21 DIAGNOSIS — K219 Gastro-esophageal reflux disease without esophagitis: Secondary | ICD-10-CM

## 2018-03-21 DIAGNOSIS — I10 Essential (primary) hypertension: Secondary | ICD-10-CM

## 2018-03-21 DIAGNOSIS — J3089 Other allergic rhinitis: Secondary | ICD-10-CM | POA: Diagnosis not present

## 2018-03-21 DIAGNOSIS — F419 Anxiety disorder, unspecified: Secondary | ICD-10-CM

## 2018-03-21 DIAGNOSIS — H269 Unspecified cataract: Secondary | ICD-10-CM | POA: Diagnosis not present

## 2018-03-21 DIAGNOSIS — B351 Tinea unguium: Secondary | ICD-10-CM

## 2018-03-21 LAB — LIPID PANEL
CHOL/HDL RATIO: 4
Cholesterol: 186 mg/dL (ref 0–200)
HDL: 48.4 mg/dL (ref >39.00)
LDL Cholesterol: 110 mg/dL — ABNORMAL HIGH (ref 0–99)
NonHDL: 138.04
TRIGLYCERIDES: 140 mg/dL (ref 0.0–149.0)
VLDL: 28 mg/dL (ref 0.0–40.0)

## 2018-03-21 LAB — POC URINALSYSI DIPSTICK (AUTOMATED)
BILIRUBIN UA: NEGATIVE
GLUCOSE UA: NEGATIVE
Ketones, UA: NEGATIVE
LEUKOCYTES UA: NEGATIVE
NITRITE UA: NEGATIVE
Protein, UA: NEGATIVE
RBC UA: NEGATIVE
Spec Grav, UA: 1.025 (ref 1.010–1.025)
UROBILINOGEN UA: 0.2 U/dL
pH, UA: 6 (ref 5.0–8.0)

## 2018-03-21 MED ORDER — POTASSIUM CHLORIDE CRYS ER 20 MEQ PO TBCR: 20.0000 meq | EXTENDED_RELEASE_TABLET | Freq: Every day | ORAL | 3 refills | Status: DC

## 2018-03-21 MED ORDER — COLESTIPOL HCL 5 G PO GRAN: 5.0000 g | GRANULES | Freq: Two times a day (BID) | ORAL | 3 refills | Status: DC

## 2018-03-21 MED ORDER — TERBINAFINE HCL 250 MG PO TABS: 250.0000 mg | ORAL_TABLET | Freq: Every day | ORAL | Status: DC

## 2018-03-21 MED ORDER — LORAZEPAM 0.5 MG PO TABS: 0.5000 mg | ORAL_TABLET | Freq: Three times a day (TID) | ORAL | 2 refills | Status: DC | PRN

## 2018-03-21 MED ORDER — OMEPRAZOLE 20 MG PO CPDR: 20.0000 mg | DELAYED_RELEASE_CAPSULE | Freq: Every day | ORAL | 3 refills | Status: DC

## 2018-03-21 MED ORDER — LISINOPRIL-HYDROCHLOROTHIAZIDE 20-12.5 MG PO TABS: ORAL_TABLET | ORAL | 3 refills | Status: DC

## 2018-03-21 NOTE — Progress Notes (Signed)
   Subjective:    Patient ID: [name], female    DOB: [birth year], 67 y.o.   MRN: [id number]  HPI Here for several issues. She asks about discolored toenails that she has had for a few years. She also asks about trying a medication for anxiety that she could take as needed. She stopped taking Sertraline due to nausea. Here asthma has been stable. BP has been well controlled. Her optometrist says she has cataracts.    Review of Systems  Constitutional: Negative.   HENT: Negative.   Eyes: Negative.   Respiratory: Negative.   Cardiovascular: Negative.   Gastrointestinal: Negative.   Genitourinary: Negative for decreased urine volume, difficulty urinating, dyspareunia, dysuria, enuresis, flank pain, frequency, hematuria, pelvic pain and urgency.  Musculoskeletal: Negative.   Skin: Negative.   Neurological: Negative.   Psychiatric/Behavioral: Negative for agitation, confusion, decreased concentration, dysphoric mood, hallucinations and sleep disturbance. The patient is nervous/anxious.        Objective:   Physical Exam  Constitutional: She is oriented to person, place, and time. She appears well-developed and well-nourished. No distress.  HENT:  Head: Normocephalic and atraumatic.  Right Ear: External ear normal.  Left Ear: External ear normal.  Nose: Nose normal.  Mouth/Throat: Oropharynx is clear and moist. No oropharyngeal exudate.  Eyes: Pupils are equal, round, and reactive to light. Conjunctivae and EOM are normal. No scleral icterus.  Neck: Normal range of motion. Neck supple. No JVD present. No thyromegaly present.  Cardiovascular: Normal rate, regular rhythm, normal heart sounds and intact distal pulses. Exam reveals no gallop and no friction rub.  No murmur heard. Pulmonary/Chest: Effort normal and breath sounds normal. No respiratory distress. She has no wheezes. She has no rales. She exhibits no tenderness.  Abdominal: Soft. Bowel sounds are normal. She exhibits  no distension and no mass. There is no tenderness. There is no rebound and no guarding.  Musculoskeletal: Normal range of motion. She exhibits no edema or tenderness.  Lymphadenopathy:    She has no cervical adenopathy.  Neurological: She is alert and oriented to person, place, and time. She has normal reflexes. She displays normal reflexes. No cranial nerve deficit. She exhibits normal muscle tone. Coordination normal.  Skin: Skin is warm and dry. No rash noted. No erythema.  Each toenail is thickened and yellow  Psychiatric: She has a normal mood and affect. Her behavior is normal. Judgment and thought content normal.          Assessment & Plan:  Her HTN ans asthma are stable. Refer to Ophthalmology for the cataracts. Try Terbinafine for the onychomycosis. Try Ativan for the anxiety. Get fasting labs.  Alysia Penna, MD

## 2018-03-29 DIAGNOSIS — J3089 Other allergic rhinitis: Secondary | ICD-10-CM | POA: Diagnosis not present

## 2018-03-31 ENCOUNTER — Telehealth: Payer: Self-pay | Admitting: Family Medicine

## 2018-03-31 DIAGNOSIS — J3089 Other allergic rhinitis: Secondary | ICD-10-CM | POA: Diagnosis not present

## 2018-03-31 NOTE — Telephone Encounter (Signed)
Sent to PCP to advise 

## 2018-03-31 NOTE — Telephone Encounter (Signed)
Copied from [place] [id number]. Topic: Quick Communication - Rx Refill/Question >> Mar 31, 2018 11:34 AM Margot Ables wrote: Medication: colestipol (COLESTID) 5 g granules - pt states that she cannot afford this medication it was $400 for 90 day supply. They have a pill form of the same medication that is $125 for 90 day supply but that is still high. She said that if Dr. Sarajane Jews said this is best she will try it but she is hoping for something less expensive. Please advise.   Preferred Pharmacy (with phone number or street name): [place], [place] [phone] (Phone) [phone] (Fax)

## 2018-04-01 MED ORDER — CHOLESTYRAMINE 4 G PO PACK: 4.0000 g | PACK | Freq: Three times a day (TID) | ORAL | 1 refills | Status: DC

## 2018-04-01 NOTE — Telephone Encounter (Signed)
Rx has been sent to [facility] as request by pt. Called and spoke with pt about stopping Colestipol and starting the new medication Cholestyramine. Pt voiced understanding.

## 2018-04-01 NOTE — Telephone Encounter (Signed)
Stop the Colestipol and try Cholestyramine 4 gm powder pack to take TID, call in 30 day supply with 5 rf

## 2018-04-05 DIAGNOSIS — J3089 Other allergic rhinitis: Secondary | ICD-10-CM | POA: Diagnosis not present

## 2018-04-05 DIAGNOSIS — J301 Allergic rhinitis due to pollen: Secondary | ICD-10-CM | POA: Diagnosis not present

## 2018-04-07 DIAGNOSIS — J3089 Other allergic rhinitis: Secondary | ICD-10-CM | POA: Diagnosis not present

## 2018-04-11 DIAGNOSIS — H21231 Degeneration of iris (pigmentary), right eye: Secondary | ICD-10-CM | POA: Diagnosis not present

## 2018-04-11 DIAGNOSIS — H35033 Hypertensive retinopathy, bilateral: Secondary | ICD-10-CM | POA: Diagnosis not present

## 2018-04-11 DIAGNOSIS — H25013 Cortical age-related cataract, bilateral: Secondary | ICD-10-CM | POA: Diagnosis not present

## 2018-04-11 DIAGNOSIS — H2513 Age-related nuclear cataract, bilateral: Secondary | ICD-10-CM | POA: Diagnosis not present

## 2018-04-11 DIAGNOSIS — H2511 Age-related nuclear cataract, right eye: Secondary | ICD-10-CM | POA: Diagnosis not present

## 2018-04-12 DIAGNOSIS — J3089 Other allergic rhinitis: Secondary | ICD-10-CM | POA: Diagnosis not present

## 2018-04-19 DIAGNOSIS — J3089 Other allergic rhinitis: Secondary | ICD-10-CM | POA: Diagnosis not present

## 2018-04-19 DIAGNOSIS — J301 Allergic rhinitis due to pollen: Secondary | ICD-10-CM | POA: Diagnosis not present

## 2018-04-26 DIAGNOSIS — J3089 Other allergic rhinitis: Secondary | ICD-10-CM | POA: Diagnosis not present

## 2018-05-03 DIAGNOSIS — J301 Allergic rhinitis due to pollen: Secondary | ICD-10-CM | POA: Diagnosis not present

## 2018-05-03 DIAGNOSIS — J3089 Other allergic rhinitis: Secondary | ICD-10-CM | POA: Diagnosis not present

## 2018-05-09 DIAGNOSIS — J3089 Other allergic rhinitis: Secondary | ICD-10-CM | POA: Diagnosis not present

## 2018-05-10 DIAGNOSIS — H2511 Age-related nuclear cataract, right eye: Secondary | ICD-10-CM | POA: Diagnosis not present

## 2018-05-10 DIAGNOSIS — H25811 Combined forms of age-related cataract, right eye: Secondary | ICD-10-CM | POA: Diagnosis not present

## 2018-05-17 DIAGNOSIS — J3089 Other allergic rhinitis: Secondary | ICD-10-CM | POA: Diagnosis not present

## 2018-05-24 DIAGNOSIS — J3089 Other allergic rhinitis: Secondary | ICD-10-CM | POA: Diagnosis not present

## 2018-05-27 ENCOUNTER — Telehealth: Payer: Self-pay | Admitting: *Deleted

## 2018-05-27 NOTE — Telephone Encounter (Signed)
Prior auth for Terbinafine 250mg  tablets sent to [email or web address].

## 2018-05-30 DIAGNOSIS — J3089 Other allergic rhinitis: Secondary | ICD-10-CM | POA: Diagnosis not present

## 2018-05-30 NOTE — Telephone Encounter (Signed)
Fax received from [facility] stating the request was approved until 08/27/18 and this was sent to be scanned.

## 2018-06-01 DIAGNOSIS — H2512 Age-related nuclear cataract, left eye: Secondary | ICD-10-CM | POA: Diagnosis not present

## 2018-06-01 DIAGNOSIS — H25012 Cortical age-related cataract, left eye: Secondary | ICD-10-CM | POA: Diagnosis not present

## 2018-06-06 DIAGNOSIS — J3089 Other allergic rhinitis: Secondary | ICD-10-CM | POA: Diagnosis not present

## 2018-06-07 DIAGNOSIS — H25812 Combined forms of age-related cataract, left eye: Secondary | ICD-10-CM | POA: Diagnosis not present

## 2018-06-07 DIAGNOSIS — H2512 Age-related nuclear cataract, left eye: Secondary | ICD-10-CM | POA: Diagnosis not present

## 2018-06-13 DIAGNOSIS — J3089 Other allergic rhinitis: Secondary | ICD-10-CM | POA: Diagnosis not present

## 2018-06-21 DIAGNOSIS — J3089 Other allergic rhinitis: Secondary | ICD-10-CM | POA: Diagnosis not present

## 2018-06-28 DIAGNOSIS — J3089 Other allergic rhinitis: Secondary | ICD-10-CM | POA: Diagnosis not present

## 2018-07-05 DIAGNOSIS — J3089 Other allergic rhinitis: Secondary | ICD-10-CM | POA: Diagnosis not present

## 2018-07-12 DIAGNOSIS — J3089 Other allergic rhinitis: Secondary | ICD-10-CM | POA: Diagnosis not present

## 2018-07-20 DIAGNOSIS — J3089 Other allergic rhinitis: Secondary | ICD-10-CM | POA: Diagnosis not present

## 2018-07-21 DIAGNOSIS — J3089 Other allergic rhinitis: Secondary | ICD-10-CM | POA: Diagnosis not present

## 2018-08-02 ENCOUNTER — Ambulatory Visit (INDEPENDENT_AMBULATORY_CARE_PROVIDER_SITE_OTHER): Payer: Medicare HMO

## 2018-08-02 ENCOUNTER — Encounter: Payer: Self-pay | Admitting: Family Medicine

## 2018-08-02 ENCOUNTER — Ambulatory Visit (INDEPENDENT_AMBULATORY_CARE_PROVIDER_SITE_OTHER): Payer: Medicare HMO | Admitting: Family Medicine

## 2018-08-02 VITALS — BP 120/72 | HR 76 | Temp 98.3°F | Wt 207.0 lb

## 2018-08-02 DIAGNOSIS — S99922A Unspecified injury of left foot, initial encounter: Secondary | ICD-10-CM | POA: Diagnosis not present

## 2018-08-02 DIAGNOSIS — Z124 Encounter for screening for malignant neoplasm of cervix: Secondary | ICD-10-CM | POA: Diagnosis not present

## 2018-08-02 DIAGNOSIS — Z1389 Encounter for screening for other disorder: Secondary | ICD-10-CM | POA: Diagnosis not present

## 2018-08-02 DIAGNOSIS — S99912A Unspecified injury of left ankle, initial encounter: Secondary | ICD-10-CM | POA: Diagnosis not present

## 2018-08-02 DIAGNOSIS — J3089 Other allergic rhinitis: Secondary | ICD-10-CM | POA: Diagnosis not present

## 2018-08-02 DIAGNOSIS — S9032XA Contusion of left foot, initial encounter: Secondary | ICD-10-CM | POA: Diagnosis not present

## 2018-08-02 DIAGNOSIS — M25572 Pain in left ankle and joints of left foot: Secondary | ICD-10-CM | POA: Diagnosis not present

## 2018-08-02 DIAGNOSIS — Z6836 Body mass index (BMI) 36.0-36.9, adult: Secondary | ICD-10-CM | POA: Diagnosis not present

## 2018-08-02 DIAGNOSIS — M79672 Pain in left foot: Secondary | ICD-10-CM | POA: Diagnosis not present

## 2018-08-02 DIAGNOSIS — M7989 Other specified soft tissue disorders: Secondary | ICD-10-CM | POA: Diagnosis not present

## 2018-08-02 DIAGNOSIS — N6321 Unspecified lump in the left breast, upper outer quadrant: Secondary | ICD-10-CM | POA: Diagnosis not present

## 2018-08-02 DIAGNOSIS — Z23 Encounter for immunization: Secondary | ICD-10-CM

## 2018-08-02 DIAGNOSIS — Z01419 Encounter for gynecological examination (general) (routine) without abnormal findings: Secondary | ICD-10-CM | POA: Diagnosis not present

## 2018-08-02 NOTE — Progress Notes (Signed)
   Subjective:    Patient ID: [name], female    DOB: [birth year], 67 y.o.   MRN: [id number]  HPI Here for an injury on 07-25-18 when she was [place] at the beach. She was eating a meal at a picnic table and when she stood up she struck her left foot against a leg of the table. She has had some pain and swelling in the foot since then, and is has turned blue. She has not treated this in any way, in fact she went bowling on it last night.    Review of Systems  Constitutional: Negative.   Respiratory: Negative.   Cardiovascular: Negative.   Musculoskeletal: Positive for arthralgias and joint swelling.       Objective:   Physical Exam  Constitutional:  Walks with a slight limp   Cardiovascular: Normal rate, regular rhythm, normal heart sounds and intact distal pulses.  Pulmonary/Chest: Effort normal and breath sounds normal.  Musculoskeletal:  The left medial foot is slightly swollen and mildly ecchymotic. Not warm. She is tender over the medial side of the foot just anterior to the medial malleolus, and also over the proximal first metatarsal. ROM is full.   On my review of her Xrays I see no fractures or dislocations.         Assessment & Plan:  Foot contusion. This should heal up on its own over th next few weeks. She can wear an elastic support sleeve. Use Ibuprofen prn.  Alysia Penna, MD

## 2018-08-03 ENCOUNTER — Other Ambulatory Visit: Payer: Self-pay | Admitting: Obstetrics and Gynecology

## 2018-08-03 DIAGNOSIS — N6320 Unspecified lump in the left breast, unspecified quadrant: Secondary | ICD-10-CM

## 2018-08-05 ENCOUNTER — Ambulatory Visit
Admission: RE | Admit: 2018-08-05 | Discharge: 2018-08-05 | Disposition: A | Discharge status: Home / Self Care | Payer: Medicare HMO | Source: Ambulatory Visit | Attending: Obstetrics and Gynecology | Admitting: Obstetrics and Gynecology

## 2018-08-05 DIAGNOSIS — N6489 Other specified disorders of breast: Secondary | ICD-10-CM | POA: Diagnosis not present

## 2018-08-05 DIAGNOSIS — N6320 Unspecified lump in the left breast, unspecified quadrant: Secondary | ICD-10-CM

## 2018-08-05 DIAGNOSIS — R928 Other abnormal and inconclusive findings on diagnostic imaging of breast: Secondary | ICD-10-CM | POA: Diagnosis not present

## 2018-08-09 DIAGNOSIS — J3089 Other allergic rhinitis: Secondary | ICD-10-CM | POA: Diagnosis not present

## 2018-08-11 DIAGNOSIS — M25562 Pain in left knee: Secondary | ICD-10-CM | POA: Diagnosis not present

## 2018-08-16 DIAGNOSIS — J3089 Other allergic rhinitis: Secondary | ICD-10-CM | POA: Diagnosis not present

## 2018-08-22 DIAGNOSIS — J3089 Other allergic rhinitis: Secondary | ICD-10-CM | POA: Diagnosis not present

## 2018-08-24 DIAGNOSIS — J3089 Other allergic rhinitis: Secondary | ICD-10-CM | POA: Diagnosis not present

## 2018-08-30 DIAGNOSIS — J3089 Other allergic rhinitis: Secondary | ICD-10-CM | POA: Diagnosis not present

## 2018-09-01 DIAGNOSIS — J3089 Other allergic rhinitis: Secondary | ICD-10-CM | POA: Diagnosis not present

## 2018-09-06 ENCOUNTER — Other Ambulatory Visit: Payer: Self-pay | Admitting: Family Medicine

## 2018-09-06 DIAGNOSIS — J3089 Other allergic rhinitis: Secondary | ICD-10-CM | POA: Diagnosis not present

## 2018-09-09 NOTE — Telephone Encounter (Signed)
Requested medication (s) are due for refill today: Yes  Requested medication (s) are on the active medication list: Yes  Last refill:  03/21/18  Future visit scheduled: No  Notes to clinic:  Unable to refill per protocol, narcotic.     Requested Prescriptions  Pending Prescriptions Disp Refills   LORazepam (ATIVAN) 0.5 MG tablet [Pharmacy Med Name: LORAZEPAM 0.5 MG TABLET] 60 tablet 2    Sig: Take 1 tablet (0.5 mg total) by mouth every 8 (eight) hours as needed for anxiety.     Not Delegated - Psychiatry:  Anxiolytics/Hypnotics Failed - 09/09/2018 12:26 PM      Failed - This refill cannot be delegated      Failed - Urine Drug Screen completed in last 360 days.      Passed - Valid encounter within last 6 months    Recent Outpatient Visits          1 month ago Contusion of left foot, initial [facility], Ishmael Holter, MD   5 months ago Essential hypertension   [facility], Ishmael Holter, MD   8 months ago Other acute sinusitis, recurrence not specified   [facility], Ishmael Holter, MD   9 months ago Weakness   [facility], Ishmael Holter, MD   1 year ago Preventative health care   [facility] [place], Ishmael Holter, MD

## 2018-09-09 NOTE — Telephone Encounter (Signed)
Pt calling to f/up on this request.  °

## 2018-09-12 DIAGNOSIS — J3089 Other allergic rhinitis: Secondary | ICD-10-CM | POA: Diagnosis not present

## 2018-09-13 NOTE — Telephone Encounter (Signed)
Pt checking status on the refill of her LORazepam (ATIVAN) 0.5 MG tablet [Pharmacy Med Name: LORAZEPAM 0.5 MG

## 2018-09-14 NOTE — Telephone Encounter (Signed)
Call in #[phone] with 5 rf 

## 2018-09-20 DIAGNOSIS — J3089 Other allergic rhinitis: Secondary | ICD-10-CM | POA: Diagnosis not present

## 2018-09-27 DIAGNOSIS — J301 Allergic rhinitis due to pollen: Secondary | ICD-10-CM | POA: Diagnosis not present

## 2018-09-27 DIAGNOSIS — J3089 Other allergic rhinitis: Secondary | ICD-10-CM | POA: Diagnosis not present

## 2018-09-27 DIAGNOSIS — J4540 Moderate persistent asthma, uncomplicated: Secondary | ICD-10-CM | POA: Diagnosis not present

## 2018-09-27 DIAGNOSIS — J3081 Allergic rhinitis due to animal (cat) (dog) hair and dander: Secondary | ICD-10-CM | POA: Diagnosis not present

## 2018-10-04 DIAGNOSIS — J3089 Other allergic rhinitis: Secondary | ICD-10-CM | POA: Diagnosis not present

## 2018-10-11 DIAGNOSIS — J301 Allergic rhinitis due to pollen: Secondary | ICD-10-CM | POA: Diagnosis not present

## 2018-10-11 DIAGNOSIS — J3089 Other allergic rhinitis: Secondary | ICD-10-CM | POA: Diagnosis not present

## 2018-10-18 DIAGNOSIS — J3089 Other allergic rhinitis: Secondary | ICD-10-CM | POA: Diagnosis not present

## 2018-10-25 DIAGNOSIS — J3089 Other allergic rhinitis: Secondary | ICD-10-CM | POA: Diagnosis not present

## 2018-10-31 DIAGNOSIS — J3089 Other allergic rhinitis: Secondary | ICD-10-CM | POA: Diagnosis not present

## 2018-11-07 DIAGNOSIS — J3089 Other allergic rhinitis: Secondary | ICD-10-CM | POA: Diagnosis not present

## 2018-11-14 DIAGNOSIS — J3089 Other allergic rhinitis: Secondary | ICD-10-CM | POA: Diagnosis not present

## 2018-11-20 DIAGNOSIS — J069 Acute upper respiratory infection, unspecified: Secondary | ICD-10-CM | POA: Diagnosis not present

## 2018-11-20 DIAGNOSIS — R05 Cough: Secondary | ICD-10-CM | POA: Diagnosis not present

## 2018-11-22 ENCOUNTER — Ambulatory Visit (INDEPENDENT_AMBULATORY_CARE_PROVIDER_SITE_OTHER): Payer: Medicare HMO | Admitting: Family Medicine

## 2018-11-22 ENCOUNTER — Encounter: Payer: Self-pay | Admitting: Family Medicine

## 2018-11-22 VITALS — BP 118/80 | HR 85 | Temp 98.7°F | Wt 204.4 lb

## 2018-11-22 DIAGNOSIS — B9789 Other viral agents as the cause of diseases classified elsewhere: Secondary | ICD-10-CM

## 2018-11-22 DIAGNOSIS — J4541 Moderate persistent asthma with (acute) exacerbation: Secondary | ICD-10-CM

## 2018-11-22 DIAGNOSIS — J988 Other specified respiratory disorders: Secondary | ICD-10-CM | POA: Diagnosis not present

## 2018-11-22 DIAGNOSIS — J209 Acute bronchitis, unspecified: Secondary | ICD-10-CM | POA: Diagnosis not present

## 2018-11-22 MED ORDER — HYDROCODONE-HOMATROPINE 5-1.5 MG/5ML PO SYRP: 5.0000 mL | ORAL_SOLUTION | ORAL | 0 refills | Status: DC | PRN

## 2018-11-22 NOTE — Progress Notes (Signed)
   Subjective:    Patient ID: [name], female    DOB: 1951-04-02, 68 y.o.   MRN: [id number]  HPI Here for one week of chest tightness, wheezing, and a dry cough. She is using her usual Symbicort daily and an albuterol rescue inhaler. She saw an urgent care 4 days ago and reportedly a CXR revealed a pneumonia. She was placed on Augmentin and was given a steroid shot. She is also using Mucinex DM bid, however she does not seem to be improving.    Review of Systems  Constitutional: Negative.   HENT: Negative.   Eyes: Negative.   Respiratory: Positive for cough, chest tightness, shortness of breath and wheezing.   Cardiovascular: Negative.        Objective:   Physical Exam Constitutional:      Comments: Coughing   HENT:     Right Ear: Tympanic membrane and ear canal normal.     Left Ear: Tympanic membrane and ear canal normal.     Nose: Nose normal.     Mouth/Throat:     Pharynx: Oropharynx is clear.  Eyes:     Conjunctiva/sclera: Conjunctivae normal.  Pulmonary:     Effort: Pulmonary effort is normal.     Breath sounds: No rales.     Comments: Scattered rhonchi and wheezes  Neurological:     Mental Status: She is alert.           Assessment & Plan:  Bronchitis with an asthma exacerbation. She was given a Duoneb nebulization treatment. She will finish up the Augmentin and use her inhalers. Recheck prn.  Alysia Penna, MD

## 2018-11-25 DIAGNOSIS — J3089 Other allergic rhinitis: Secondary | ICD-10-CM | POA: Diagnosis not present

## 2018-11-27 DIAGNOSIS — R0602 Shortness of breath: Secondary | ICD-10-CM | POA: Diagnosis not present

## 2018-11-27 DIAGNOSIS — J22 Unspecified acute lower respiratory infection: Secondary | ICD-10-CM | POA: Diagnosis not present

## 2018-11-27 DIAGNOSIS — R05 Cough: Secondary | ICD-10-CM | POA: Diagnosis not present

## 2018-12-02 DIAGNOSIS — J3089 Other allergic rhinitis: Secondary | ICD-10-CM | POA: Diagnosis not present

## 2018-12-06 DIAGNOSIS — J3089 Other allergic rhinitis: Secondary | ICD-10-CM | POA: Diagnosis not present

## 2018-12-13 DIAGNOSIS — J3089 Other allergic rhinitis: Secondary | ICD-10-CM | POA: Diagnosis not present

## 2018-12-19 DIAGNOSIS — J3089 Other allergic rhinitis: Secondary | ICD-10-CM | POA: Diagnosis not present

## 2018-12-26 DIAGNOSIS — J3089 Other allergic rhinitis: Secondary | ICD-10-CM | POA: Diagnosis not present

## 2018-12-28 DIAGNOSIS — J3089 Other allergic rhinitis: Secondary | ICD-10-CM | POA: Diagnosis not present

## 2019-01-03 DIAGNOSIS — J3089 Other allergic rhinitis: Secondary | ICD-10-CM | POA: Diagnosis not present

## 2019-01-05 DIAGNOSIS — M79641 Pain in right hand: Secondary | ICD-10-CM | POA: Diagnosis not present

## 2019-01-05 DIAGNOSIS — M65331 Trigger finger, right middle finger: Secondary | ICD-10-CM | POA: Diagnosis not present

## 2019-01-09 DIAGNOSIS — J3089 Other allergic rhinitis: Secondary | ICD-10-CM | POA: Diagnosis not present

## 2019-01-11 DIAGNOSIS — J3089 Other allergic rhinitis: Secondary | ICD-10-CM | POA: Diagnosis not present

## 2019-01-16 DIAGNOSIS — J3089 Other allergic rhinitis: Secondary | ICD-10-CM | POA: Diagnosis not present

## 2019-01-17 DIAGNOSIS — Z961 Presence of intraocular lens: Secondary | ICD-10-CM | POA: Diagnosis not present

## 2019-01-17 DIAGNOSIS — H35033 Hypertensive retinopathy, bilateral: Secondary | ICD-10-CM | POA: Diagnosis not present

## 2019-01-17 DIAGNOSIS — H21231 Degeneration of iris (pigmentary), right eye: Secondary | ICD-10-CM | POA: Diagnosis not present

## 2019-01-17 DIAGNOSIS — H35363 Drusen (degenerative) of macula, bilateral: Secondary | ICD-10-CM | POA: Diagnosis not present

## 2019-01-19 DIAGNOSIS — J3089 Other allergic rhinitis: Secondary | ICD-10-CM | POA: Diagnosis not present

## 2019-01-23 DIAGNOSIS — J3089 Other allergic rhinitis: Secondary | ICD-10-CM | POA: Diagnosis not present

## 2019-01-30 DIAGNOSIS — J3089 Other allergic rhinitis: Secondary | ICD-10-CM | POA: Diagnosis not present

## 2019-02-06 DIAGNOSIS — J3089 Other allergic rhinitis: Secondary | ICD-10-CM | POA: Diagnosis not present

## 2019-02-07 DIAGNOSIS — M79641 Pain in right hand: Secondary | ICD-10-CM | POA: Diagnosis not present

## 2019-02-07 DIAGNOSIS — M65331 Trigger finger, right middle finger: Secondary | ICD-10-CM | POA: Diagnosis not present

## 2019-02-13 DIAGNOSIS — J3089 Other allergic rhinitis: Secondary | ICD-10-CM | POA: Diagnosis not present

## 2019-02-20 DIAGNOSIS — J3089 Other allergic rhinitis: Secondary | ICD-10-CM | POA: Diagnosis not present

## 2019-02-28 DIAGNOSIS — J3089 Other allergic rhinitis: Secondary | ICD-10-CM | POA: Diagnosis not present

## 2019-03-06 DIAGNOSIS — J3089 Other allergic rhinitis: Secondary | ICD-10-CM | POA: Diagnosis not present

## 2019-03-13 DIAGNOSIS — J3089 Other allergic rhinitis: Secondary | ICD-10-CM | POA: Diagnosis not present

## 2019-03-17 ENCOUNTER — Encounter: Payer: Self-pay | Admitting: Family Medicine

## 2019-03-17 ENCOUNTER — Ambulatory Visit (INDEPENDENT_AMBULATORY_CARE_PROVIDER_SITE_OTHER): Payer: Medicare HMO | Admitting: Family Medicine

## 2019-03-17 ENCOUNTER — Other Ambulatory Visit: Payer: Self-pay

## 2019-03-17 DIAGNOSIS — K219 Gastro-esophageal reflux disease without esophagitis: Secondary | ICD-10-CM | POA: Diagnosis not present

## 2019-03-17 DIAGNOSIS — I10 Essential (primary) hypertension: Secondary | ICD-10-CM

## 2019-03-17 DIAGNOSIS — F419 Anxiety disorder, unspecified: Secondary | ICD-10-CM | POA: Diagnosis not present

## 2019-03-17 MED ORDER — LORAZEPAM 1 MG PO TABS: 1.0000 mg | ORAL_TABLET | Freq: Three times a day (TID) | ORAL | 1 refills | Status: DC | PRN

## 2019-03-17 MED ORDER — POTASSIUM CHLORIDE CRYS ER 20 MEQ PO TBCR: 20.0000 meq | EXTENDED_RELEASE_TABLET | Freq: Every day | ORAL | 3 refills | Status: DC

## 2019-03-17 MED ORDER — LISINOPRIL-HYDROCHLOROTHIAZIDE 20-12.5 MG PO TABS: ORAL_TABLET | ORAL | 3 refills | Status: DC

## 2019-03-17 MED ORDER — OMEPRAZOLE 20 MG PO CPDR: 20.0000 mg | DELAYED_RELEASE_CAPSULE | Freq: Every day | ORAL | 3 refills | Status: DC

## 2019-03-17 NOTE — Progress Notes (Signed)
Subjective:    Patient ID: [name], female    DOB: [birth year], 68 y.o.   MRN: [id number]  HPI Virtual Visit via Video Note  I connected with the patient on 03/17/19 at 10:45 AM EDT by a video enabled telemedicine application and verified that I am speaking with the correct person using two identifiers.  Location patient: home Location provider:work or home office Persons participating in the virtual visit: patient, provider  I discussed the limitations of evaluation and management by telemedicine and the availability of in person appointments. The patient expressed understanding and agreed to proceed.   HPI: Here to follow up on issues. She feels fine but she does ask if the dose of lorazepam can be increased. She struggles with anxiety on some days. She sleeps well. Her BP is stable and her GED is stable.    ROS: See pertinent positives and negatives per HPI.  Past Medical History:  Diagnosis Date  . Allergy   . Asthma    sees Dr. Baird Lyons  . Colon polyp 2007  . Depression   . Diverticulosis   . GERD (gastroesophageal reflux disease)   . Gynecological examination    sees Dr. Paula Compton  . Hyperlipemia   . Hypertension     Past Surgical History:  Procedure Laterality Date  . CARPAL TUNNEL RELEASE Right 1980  . COLONOSCOPY  06-20-14   per Dr. Olevia Perches, adenomatous polyp,repeat in 5 yrs   . DILATION AND CURETTAGE OF UTERUS  2005    Family History  Problem Relation Age of Onset  . Colon cancer Mother 37  . Asthma Mother   . Colon cancer Father 49  . Liver cancer Father   . Throat cancer Brother   . Colon cancer Maternal Aunt        not sure age of onset  . Colon cancer Maternal Aunt        not sure age of onset  . Diabetes Sister   . Pancreatic cancer Neg Hx   . Rectal cancer Neg Hx   . Stomach cancer Neg Hx   . Allergic rhinitis Neg Hx   . Angioedema Neg Hx   . Eczema Neg Hx      Current Outpatient Medications:  .  albuterol (PROAIR  HFA) 108 (90 Base) MCG/ACT inhaler, INHALE 2 PUFFS INTO THE LUNGS EVERY 6 HOURS AS NEEDED FOR WHEEZING., Disp: 3 Inhaler, Rfl: 3 .  aspirin 81 MG tablet, Take 81 mg by mouth daily.  , Disp: , Rfl:  .  budesonide-formoterol (SYMBICORT) 80-4.5 MCG/ACT inhaler, 2 puffs then rinse mouth, twice daily maintenance inhaler, Disp: 3 Inhaler, Rfl: 3 .  Diphenhyd-Hydrocort-Nystatin (FIRST-DUKES MOUTHWASH) SUSP, Swish and swallow 65mLs three times daily, Disp: 150 mL, Rfl: 0 .  EPINEPHrine 0.3 mg/0.3 mL IJ SOAJ injection, Use as directed for severe allergic reaction, Disp: 2 Device, Rfl: 1 .  Flaxseed, Linseed, (FLAX SEED OIL) 1000 MG CAPS, Take 1 capsule by mouth daily.  , Disp: , Rfl:  .  fluticasone (FLONASE) 50 MCG/ACT nasal spray, 1-2 sprays each nostril once or twice daily, Disp: 48 g, Rfl: 3 .  HYDROcodone-homatropine (HYCODAN) 5-1.5 MG/5ML syrup, Take 5 mLs by mouth every 4 (four) hours as needed for cough., Disp: 240 mL, Rfl: 0 .  lisinopril-hydrochlorothiazide (PRINZIDE,ZESTORETIC) 20-12.5 MG tablet, TAKE ONE TABLET BY MOUTH EVERY DAY, Disp: 90 tablet, Rfl: 3 .  LORazepam (ATIVAN) 0.5 MG tablet, TAKE 1 TABLET (0.5 MG TOTAL) BY MOUTH EVERY 8 (EIGHT)  HOURS AS NEEDED FOR ANXIETY., Disp: 60 tablet, Rfl: 5 .  NON FORMULARY, Allergy vaccine. Once a week , Disp: , Rfl:  .  omeprazole (PRILOSEC) 20 MG capsule, Take 1 capsule (20 mg total) by mouth daily., Disp: 90 capsule, Rfl: 3 .  potassium chloride SA (KLOR-CON M20) 20 MEQ tablet, Take 1 tablet (20 mEq total) by mouth daily., Disp: 90 tablet, Rfl: 3 .  pyridOXINE (VITAMIN B-6) 100 MG tablet, Take 100 mg by mouth daily.  , Disp: , Rfl:   EXAM:  VITALS per patient if applicable:  GENERAL: alert, oriented, appears well and in no acute distress  HEENT: atraumatic, conjunttiva clear, no obvious abnormalities on inspection of external nose and ears  NECK: normal movements of the head and neck  LUNGS: on inspection no signs of respiratory distress,  breathing rate appears normal, no obvious gross SOB, gasping or wheezing  CV: no obvious cyanosis  MS: moves all visible extremities without noticeable abnormality  PSYCH/NEURO: pleasant and cooperative, no obvious depression or anxiety, speech and thought processing grossly intact  ASSESSMENT AND PLAN: Her HTN and GERD are stable and the meds are refilled. For the anxiety we will increase the Lorazepam to 1 mg as needed.  Alysia Penna, MD Discussed the following assessment and plan:  No diagnosis found.     I discussed the assessment and treatment plan with the patient. The patient was provided an opportunity to ask questions and all were answered. The patient agreed with the plan and demonstrated an understanding of the instructions.   The patient was advised to call back or seek an in-person evaluation if the symptoms worsen or if the condition fails to improve as anticipated.     Review of Systems     Objective:   Physical Exam        Assessment & Plan:

## 2019-03-20 DIAGNOSIS — J3089 Other allergic rhinitis: Secondary | ICD-10-CM | POA: Diagnosis not present

## 2019-03-27 DIAGNOSIS — J3089 Other allergic rhinitis: Secondary | ICD-10-CM | POA: Diagnosis not present

## 2019-04-04 DIAGNOSIS — J3089 Other allergic rhinitis: Secondary | ICD-10-CM | POA: Diagnosis not present

## 2019-04-12 DIAGNOSIS — J3089 Other allergic rhinitis: Secondary | ICD-10-CM | POA: Diagnosis not present

## 2019-04-17 DIAGNOSIS — J3089 Other allergic rhinitis: Secondary | ICD-10-CM | POA: Diagnosis not present

## 2019-04-25 DIAGNOSIS — J3089 Other allergic rhinitis: Secondary | ICD-10-CM | POA: Diagnosis not present

## 2019-05-01 DIAGNOSIS — J3089 Other allergic rhinitis: Secondary | ICD-10-CM | POA: Diagnosis not present

## 2019-05-02 DIAGNOSIS — J3089 Other allergic rhinitis: Secondary | ICD-10-CM | POA: Diagnosis not present

## 2019-05-09 DIAGNOSIS — J3089 Other allergic rhinitis: Secondary | ICD-10-CM | POA: Diagnosis not present

## 2019-05-17 DIAGNOSIS — J3089 Other allergic rhinitis: Secondary | ICD-10-CM | POA: Diagnosis not present

## 2019-05-22 ENCOUNTER — Other Ambulatory Visit: Payer: Self-pay | Admitting: Family Medicine

## 2019-05-22 DIAGNOSIS — J3089 Other allergic rhinitis: Secondary | ICD-10-CM | POA: Diagnosis not present

## 2019-05-24 DIAGNOSIS — J3089 Other allergic rhinitis: Secondary | ICD-10-CM | POA: Diagnosis not present

## 2019-05-29 DIAGNOSIS — J3089 Other allergic rhinitis: Secondary | ICD-10-CM | POA: Diagnosis not present

## 2019-05-30 NOTE — Telephone Encounter (Signed)
Pt called for an update on the refill request.

## 2019-05-31 DIAGNOSIS — J3089 Other allergic rhinitis: Secondary | ICD-10-CM | POA: Diagnosis not present

## 2019-06-05 DIAGNOSIS — J3089 Other allergic rhinitis: Secondary | ICD-10-CM | POA: Diagnosis not present

## 2019-06-06 ENCOUNTER — Encounter: Payer: Self-pay | Admitting: Gastroenterology

## 2019-06-12 DIAGNOSIS — J3089 Other allergic rhinitis: Secondary | ICD-10-CM | POA: Diagnosis not present

## 2019-06-19 DIAGNOSIS — J3089 Other allergic rhinitis: Secondary | ICD-10-CM | POA: Diagnosis not present

## 2019-06-26 DIAGNOSIS — J3089 Other allergic rhinitis: Secondary | ICD-10-CM | POA: Diagnosis not present

## 2019-07-03 DIAGNOSIS — J3089 Other allergic rhinitis: Secondary | ICD-10-CM | POA: Diagnosis not present

## 2019-07-10 DIAGNOSIS — J3089 Other allergic rhinitis: Secondary | ICD-10-CM | POA: Diagnosis not present

## 2019-07-26 DIAGNOSIS — J3089 Other allergic rhinitis: Secondary | ICD-10-CM | POA: Diagnosis not present

## 2019-07-31 DIAGNOSIS — J3089 Other allergic rhinitis: Secondary | ICD-10-CM | POA: Diagnosis not present

## 2019-08-07 DIAGNOSIS — J3089 Other allergic rhinitis: Secondary | ICD-10-CM | POA: Diagnosis not present

## 2019-08-10 DIAGNOSIS — J209 Acute bronchitis, unspecified: Secondary | ICD-10-CM | POA: Diagnosis not present

## 2019-08-10 DIAGNOSIS — J4540 Moderate persistent asthma, uncomplicated: Secondary | ICD-10-CM | POA: Diagnosis not present

## 2019-08-10 DIAGNOSIS — J3081 Allergic rhinitis due to animal (cat) (dog) hair and dander: Secondary | ICD-10-CM | POA: Diagnosis not present

## 2019-08-10 DIAGNOSIS — J3089 Other allergic rhinitis: Secondary | ICD-10-CM | POA: Diagnosis not present

## 2019-08-15 DIAGNOSIS — J3089 Other allergic rhinitis: Secondary | ICD-10-CM | POA: Diagnosis not present

## 2019-08-24 ENCOUNTER — Ambulatory Visit (INDEPENDENT_AMBULATORY_CARE_PROVIDER_SITE_OTHER): Payer: Medicare HMO

## 2019-08-24 ENCOUNTER — Other Ambulatory Visit: Payer: Self-pay

## 2019-08-24 DIAGNOSIS — Z23 Encounter for immunization: Secondary | ICD-10-CM | POA: Diagnosis not present

## 2019-08-24 DIAGNOSIS — J3089 Other allergic rhinitis: Secondary | ICD-10-CM | POA: Diagnosis not present

## 2019-08-30 DIAGNOSIS — J3089 Other allergic rhinitis: Secondary | ICD-10-CM | POA: Diagnosis not present

## 2019-08-31 DIAGNOSIS — Z01419 Encounter for gynecological examination (general) (routine) without abnormal findings: Secondary | ICD-10-CM | POA: Diagnosis not present

## 2019-08-31 DIAGNOSIS — Z1231 Encounter for screening mammogram for malignant neoplasm of breast: Secondary | ICD-10-CM | POA: Diagnosis not present

## 2019-08-31 DIAGNOSIS — Z0000 Encounter for general adult medical examination without abnormal findings: Secondary | ICD-10-CM | POA: Diagnosis not present

## 2019-09-06 DIAGNOSIS — J3089 Other allergic rhinitis: Secondary | ICD-10-CM | POA: Diagnosis not present

## 2019-09-11 DIAGNOSIS — J3089 Other allergic rhinitis: Secondary | ICD-10-CM | POA: Diagnosis not present

## 2019-09-18 DIAGNOSIS — J3089 Other allergic rhinitis: Secondary | ICD-10-CM | POA: Diagnosis not present

## 2019-09-28 DIAGNOSIS — J4540 Moderate persistent asthma, uncomplicated: Secondary | ICD-10-CM | POA: Diagnosis not present

## 2019-09-28 DIAGNOSIS — J3089 Other allergic rhinitis: Secondary | ICD-10-CM | POA: Diagnosis not present

## 2019-09-28 DIAGNOSIS — J3081 Allergic rhinitis due to animal (cat) (dog) hair and dander: Secondary | ICD-10-CM | POA: Diagnosis not present

## 2019-10-03 DIAGNOSIS — J3089 Other allergic rhinitis: Secondary | ICD-10-CM | POA: Diagnosis not present

## 2019-10-09 DIAGNOSIS — J3089 Other allergic rhinitis: Secondary | ICD-10-CM | POA: Diagnosis not present

## 2019-10-16 DIAGNOSIS — J3089 Other allergic rhinitis: Secondary | ICD-10-CM | POA: Diagnosis not present

## 2019-10-23 DIAGNOSIS — J3089 Other allergic rhinitis: Secondary | ICD-10-CM | POA: Diagnosis not present

## 2019-10-31 DIAGNOSIS — J3089 Other allergic rhinitis: Secondary | ICD-10-CM | POA: Diagnosis not present

## 2019-11-06 DIAGNOSIS — J3089 Other allergic rhinitis: Secondary | ICD-10-CM | POA: Diagnosis not present

## 2019-11-08 DIAGNOSIS — J3089 Other allergic rhinitis: Secondary | ICD-10-CM | POA: Diagnosis not present

## 2019-11-14 DIAGNOSIS — J3089 Other allergic rhinitis: Secondary | ICD-10-CM | POA: Diagnosis not present

## 2019-11-16 DIAGNOSIS — J3089 Other allergic rhinitis: Secondary | ICD-10-CM | POA: Diagnosis not present

## 2019-11-20 DIAGNOSIS — J3089 Other allergic rhinitis: Secondary | ICD-10-CM | POA: Diagnosis not present

## 2019-11-21 ENCOUNTER — Other Ambulatory Visit: Payer: Self-pay | Admitting: Family Medicine

## 2019-11-22 DIAGNOSIS — J3089 Other allergic rhinitis: Secondary | ICD-10-CM | POA: Diagnosis not present

## 2019-11-27 DIAGNOSIS — J3089 Other allergic rhinitis: Secondary | ICD-10-CM | POA: Diagnosis not present

## 2019-12-04 DIAGNOSIS — J3089 Other allergic rhinitis: Secondary | ICD-10-CM | POA: Diagnosis not present

## 2019-12-11 DIAGNOSIS — J3089 Other allergic rhinitis: Secondary | ICD-10-CM | POA: Diagnosis not present

## 2019-12-14 DIAGNOSIS — H52223 Regular astigmatism, bilateral: Secondary | ICD-10-CM | POA: Diagnosis not present

## 2019-12-14 DIAGNOSIS — Z961 Presence of intraocular lens: Secondary | ICD-10-CM | POA: Diagnosis not present

## 2019-12-14 DIAGNOSIS — H524 Presbyopia: Secondary | ICD-10-CM | POA: Diagnosis not present

## 2019-12-14 DIAGNOSIS — H5203 Hypermetropia, bilateral: Secondary | ICD-10-CM | POA: Diagnosis not present

## 2019-12-18 DIAGNOSIS — J3089 Other allergic rhinitis: Secondary | ICD-10-CM | POA: Diagnosis not present

## 2019-12-26 DIAGNOSIS — J3089 Other allergic rhinitis: Secondary | ICD-10-CM | POA: Diagnosis not present

## 2020-01-01 DIAGNOSIS — J3089 Other allergic rhinitis: Secondary | ICD-10-CM | POA: Diagnosis not present

## 2020-01-09 DIAGNOSIS — J3089 Other allergic rhinitis: Secondary | ICD-10-CM | POA: Diagnosis not present

## 2020-01-15 DIAGNOSIS — J3089 Other allergic rhinitis: Secondary | ICD-10-CM | POA: Diagnosis not present

## 2020-01-22 DIAGNOSIS — J3089 Other allergic rhinitis: Secondary | ICD-10-CM | POA: Diagnosis not present

## 2020-01-30 DIAGNOSIS — J3089 Other allergic rhinitis: Secondary | ICD-10-CM | POA: Diagnosis not present

## 2020-02-05 DIAGNOSIS — J3089 Other allergic rhinitis: Secondary | ICD-10-CM | POA: Diagnosis not present

## 2020-02-13 DIAGNOSIS — J3089 Other allergic rhinitis: Secondary | ICD-10-CM | POA: Diagnosis not present

## 2020-02-20 DIAGNOSIS — J3089 Other allergic rhinitis: Secondary | ICD-10-CM | POA: Diagnosis not present

## 2020-02-28 DIAGNOSIS — J3089 Other allergic rhinitis: Secondary | ICD-10-CM | POA: Diagnosis not present

## 2020-03-01 DIAGNOSIS — J3089 Other allergic rhinitis: Secondary | ICD-10-CM | POA: Diagnosis not present

## 2020-03-04 DIAGNOSIS — J3089 Other allergic rhinitis: Secondary | ICD-10-CM | POA: Diagnosis not present

## 2020-03-06 DIAGNOSIS — L821 Other seborrheic keratosis: Secondary | ICD-10-CM | POA: Diagnosis not present

## 2020-03-06 DIAGNOSIS — L918 Other hypertrophic disorders of the skin: Secondary | ICD-10-CM | POA: Diagnosis not present

## 2020-03-11 ENCOUNTER — Ambulatory Visit (INDEPENDENT_AMBULATORY_CARE_PROVIDER_SITE_OTHER): Payer: Medicare HMO | Admitting: Family Medicine

## 2020-03-11 ENCOUNTER — Other Ambulatory Visit: Payer: Self-pay

## 2020-03-11 ENCOUNTER — Encounter: Payer: Self-pay | Admitting: Family Medicine

## 2020-03-11 VITALS — BP 140/70 | HR 82 | Temp 98.1°F | Wt 205.0 lb

## 2020-03-11 DIAGNOSIS — G629 Polyneuropathy, unspecified: Secondary | ICD-10-CM | POA: Diagnosis not present

## 2020-03-11 DIAGNOSIS — J3089 Other allergic rhinitis: Secondary | ICD-10-CM | POA: Diagnosis not present

## 2020-03-11 DIAGNOSIS — Z0000 Encounter for general adult medical examination without abnormal findings: Secondary | ICD-10-CM

## 2020-03-11 LAB — CBC WITH DIFFERENTIAL/PLATELET
Basophils Absolute: 0.1 10*3/uL (ref 0.0–0.1)
Basophils Relative: 0.9 % (ref 0.0–3.0)
Eosinophils Absolute: 0.2 10*3/uL (ref 0.0–0.7)
Eosinophils Relative: 2.3 % (ref 0.0–5.0)
HCT: 35.9 % — ABNORMAL LOW (ref 36.0–46.0)
Hemoglobin: 12 g/dL (ref 12.0–15.0)
Lymphocytes Relative: 24.2 % (ref 12.0–46.0)
Lymphs Abs: 1.9 10*3/uL (ref 0.7–4.0)
MCHC: 33.4 g/dL (ref 30.0–36.0)
MCV: 91.3 fl (ref 78.0–100.0)
Monocytes Absolute: 0.5 10*3/uL (ref 0.1–1.0)
Monocytes Relative: 5.7 % (ref 3.0–12.0)
Neutro Abs: 5.3 10*3/uL (ref 1.4–7.7)
Neutrophils Relative %: 66.9 % (ref 43.0–77.0)
Platelets: 204 10*3/uL (ref 150.0–400.0)
RBC: 3.93 Mil/uL (ref 3.87–5.11)
RDW: 14.6 % (ref 11.5–15.5)
WBC: 8 10*3/uL (ref 4.0–10.5)

## 2020-03-11 LAB — LIPID PANEL
Cholesterol: 187 mg/dL (ref 0–200)
HDL: 45.9 mg/dL (ref >39.00)
LDL Cholesterol: 103 mg/dL — ABNORMAL HIGH (ref 0–99)
NonHDL: 140.95
Total CHOL/HDL Ratio: 4
Triglycerides: 190 mg/dL — ABNORMAL HIGH (ref 0.0–149.0)
VLDL: 38 mg/dL (ref 0.0–40.0)

## 2020-03-11 LAB — HEPATIC FUNCTION PANEL
ALT: 21 U/L (ref 0–35)
AST: 21 U/L (ref 0–37)
Albumin: 4.1 g/dL (ref 3.5–5.2)
Alkaline Phosphatase: 71 U/L (ref 39–117)
Bilirubin, Direct: 0.1 mg/dL (ref 0.0–0.3)
Total Bilirubin: 0.6 mg/dL (ref 0.2–1.2)
Total Protein: 6.3 g/dL (ref 6.0–8.3)

## 2020-03-11 LAB — BASIC METABOLIC PANEL
BUN: 12 mg/dL (ref 6–23)
CO2: 26 mEq/L (ref 19–32)
Calcium: 9.2 mg/dL (ref 8.4–10.5)
Chloride: 105 mEq/L (ref 96–112)
Creatinine, Ser: 0.88 mg/dL (ref 0.40–1.20)
GFR: 64.08 mL/min (ref >60.00)
Glucose, Bld: 118 mg/dL — ABNORMAL HIGH (ref 70–99)
Potassium: 4 mEq/L (ref 3.5–5.1)
Sodium: 140 mEq/L (ref 135–145)

## 2020-03-11 LAB — HEMOGLOBIN A1C: Hgb A1c MFr Bld: 5.8 % (ref 4.6–6.5)

## 2020-03-11 LAB — TSH: TSH: 2.12 u[IU]/mL (ref 0.35–4.50)

## 2020-03-11 MED ORDER — POTASSIUM CHLORIDE CRYS ER 20 MEQ PO TBCR: 20.0000 meq | EXTENDED_RELEASE_TABLET | Freq: Every day | ORAL | 3 refills | Status: DC

## 2020-03-11 MED ORDER — OMEPRAZOLE 20 MG PO CPDR: 20.0000 mg | DELAYED_RELEASE_CAPSULE | Freq: Every day | ORAL | 3 refills | Status: DC

## 2020-03-11 MED ORDER — COLESTIPOL HCL 5 G PO GRAN: 5.0000 g | GRANULES | Freq: Two times a day (BID) | ORAL | 3 refills | Status: DC

## 2020-03-11 MED ORDER — LORAZEPAM 1 MG PO TABS: 1.0000 mg | ORAL_TABLET | Freq: Three times a day (TID) | ORAL | 1 refills | Status: DC | PRN

## 2020-03-11 MED ORDER — LISINOPRIL-HYDROCHLOROTHIAZIDE 20-12.5 MG PO TABS: ORAL_TABLET | ORAL | 3 refills | Status: DC

## 2020-03-11 NOTE — Progress Notes (Signed)
   Subjective:    Patient ID: [name], female    DOB: 06-17-1951, 69 y.o.   MRN: [id number]  HPI Here for a well exam. Her main complaint today is occasional tingling or numbness in the hands and feet. This started a few months ago. There is no swelling or pain. Her BP is stable. She missed her colonoscopy last year due to the pandemic. She did see her GYN recently for an exam and mammogram.    Review of Systems  Constitutional: Negative.   HENT: Negative.   Eyes: Negative.   Respiratory: Negative.   Cardiovascular: Negative.   Gastrointestinal: Negative.   Genitourinary: Negative for decreased urine volume, difficulty urinating, dyspareunia, dysuria, enuresis, flank pain, frequency, hematuria, pelvic pain and urgency.  Musculoskeletal: Negative.   Skin: Negative.   Neurological: Negative.   Psychiatric/Behavioral: Negative.        Objective:   Physical Exam Constitutional:      General: She is not in acute distress.    Appearance: She is well-developed.  HENT:     Head: Normocephalic and atraumatic.     Right Ear: External ear normal.     Left Ear: External ear normal.     Nose: Nose normal.     Mouth/Throat:     Pharynx: No oropharyngeal exudate.  Eyes:     General: No scleral icterus.    Conjunctiva/sclera: Conjunctivae normal.     Pupils: Pupils are equal, round, and reactive to light.  Neck:     Thyroid: No thyromegaly.     Vascular: No JVD.  Cardiovascular:     Rate and Rhythm: Normal rate and regular rhythm.     Heart sounds: Normal heart sounds. No murmur. No friction rub. No gallop.   Pulmonary:     Effort: Pulmonary effort is normal. No respiratory distress.     Breath sounds: Normal breath sounds. No wheezing or rales.  Chest:     Chest wall: No tenderness.  Abdominal:     General: Bowel sounds are normal. There is no distension.     Palpations: Abdomen is soft. There is no mass.     Tenderness: There is no abdominal tenderness. There is no  guarding or rebound.  Musculoskeletal:        General: No tenderness. Normal range of motion.     Cervical back: Normal range of motion and neck supple.  Lymphadenopathy:     Cervical: No cervical adenopathy.  Skin:    General: Skin is warm and dry.     Findings: No erythema or rash.  Neurological:     Mental Status: She is alert and oriented to person, place, and time.     Cranial Nerves: No cranial nerve deficit.     Motor: No abnormal muscle tone.     Coordination: Coordination normal.     Deep Tendon Reflexes: Reflexes are normal and symmetric. Reflexes normal.  Psychiatric:        Behavior: Behavior normal.        Thought Content: Thought content normal.        Judgment: Judgment normal.           Assessment & Plan:  Well exam. We will get fasting labs. We discussed diet and exercise. 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 Set up another colonoscopy.  Alysia Penna, MD

## 2020-03-12 ENCOUNTER — Telehealth: Payer: Self-pay | Admitting: Family Medicine

## 2020-03-12 MED ORDER — CHOLESTYRAMINE LIGHT 4 G PO PACK
4.0000 g | PACK | Freq: Two times a day (BID) | ORAL | 3 refills | Status: DC
Start: 2020-03-12 — End: 2020-09-03

## 2020-03-12 NOTE — Telephone Encounter (Signed)
Left a detailed message on verified voice mail.   

## 2020-03-12 NOTE — Telephone Encounter (Signed)
She can try Cholestyramine. I sent this to [facility]

## 2020-03-12 NOTE — Telephone Encounter (Signed)
Pt was prescribed colestipol (COLESTID) 5 g granules. It is too expensive she would like to know if there is a cheaper alternative. Pt use [facility] - [place], [place] please  [phone]

## 2020-03-18 DIAGNOSIS — J3089 Other allergic rhinitis: Secondary | ICD-10-CM | POA: Diagnosis not present

## 2020-03-20 DIAGNOSIS — J3089 Other allergic rhinitis: Secondary | ICD-10-CM | POA: Diagnosis not present

## 2020-03-25 DIAGNOSIS — J3089 Other allergic rhinitis: Secondary | ICD-10-CM | POA: Diagnosis not present

## 2020-03-27 DIAGNOSIS — J3089 Other allergic rhinitis: Secondary | ICD-10-CM | POA: Diagnosis not present

## 2020-04-01 DIAGNOSIS — J3089 Other allergic rhinitis: Secondary | ICD-10-CM | POA: Diagnosis not present

## 2020-04-09 DIAGNOSIS — J3089 Other allergic rhinitis: Secondary | ICD-10-CM | POA: Diagnosis not present

## 2020-04-15 DIAGNOSIS — J3089 Other allergic rhinitis: Secondary | ICD-10-CM | POA: Diagnosis not present

## 2020-04-15 DIAGNOSIS — J301 Allergic rhinitis due to pollen: Secondary | ICD-10-CM | POA: Diagnosis not present

## 2020-04-23 DIAGNOSIS — J3089 Other allergic rhinitis: Secondary | ICD-10-CM | POA: Diagnosis not present

## 2020-04-23 DIAGNOSIS — J301 Allergic rhinitis due to pollen: Secondary | ICD-10-CM | POA: Diagnosis not present

## 2020-04-30 DIAGNOSIS — J3089 Other allergic rhinitis: Secondary | ICD-10-CM | POA: Diagnosis not present

## 2020-05-06 DIAGNOSIS — J3089 Other allergic rhinitis: Secondary | ICD-10-CM | POA: Diagnosis not present

## 2020-05-08 ENCOUNTER — Other Ambulatory Visit: Payer: Self-pay

## 2020-05-08 ENCOUNTER — Encounter: Payer: Self-pay | Admitting: Family Medicine

## 2020-05-08 ENCOUNTER — Ambulatory Visit (INDEPENDENT_AMBULATORY_CARE_PROVIDER_SITE_OTHER): Payer: Medicare HMO | Admitting: Family Medicine

## 2020-05-08 VITALS — BP 130/70 | HR 87 | Temp 97.6°F | Wt 202.8 lb

## 2020-05-08 DIAGNOSIS — B356 Tinea cruris: Secondary | ICD-10-CM

## 2020-05-08 MED ORDER — KETOCONAZOLE 2 % EX CREA: 1.0000 "application " | TOPICAL_CREAM | Freq: Every day | CUTANEOUS | 2 refills | Status: DC

## 2020-05-08 NOTE — Progress Notes (Signed)
   Subjective:    Patient ID: [name], female    DOB: 18-Feb-1951, 69 y.o.   MRN: [id number]  HPI Here for 2 weeks of a burning rash in the groins. OTC creams do not help.    Review of Systems  Constitutional: Negative.   Respiratory: Negative.   Cardiovascular: Negative.   Skin: Positive for rash.       Objective:   Physical Exam Constitutional:      Appearance: Normal appearance.  Cardiovascular:     Rate and Rhythm: Normal rate and regular rhythm.     Pulses: Normal pulses.     Heart sounds: Normal heart sounds.  Pulmonary:     Effort: Pulmonary effort is normal.     Breath sounds: Normal breath sounds.  Skin:    Comments: Both groin areas have a macular red rash   Neurological:     Mental Status: She is alert.           Assessment & Plan:  Tinea cruris, treat with Ketoconazole cream.  Alysia Penna, MD

## 2020-05-14 DIAGNOSIS — J3089 Other allergic rhinitis: Secondary | ICD-10-CM | POA: Diagnosis not present

## 2020-05-21 DIAGNOSIS — J3089 Other allergic rhinitis: Secondary | ICD-10-CM | POA: Diagnosis not present

## 2020-05-27 DIAGNOSIS — J3089 Other allergic rhinitis: Secondary | ICD-10-CM | POA: Diagnosis not present

## 2020-06-04 DIAGNOSIS — J3089 Other allergic rhinitis: Secondary | ICD-10-CM | POA: Diagnosis not present

## 2020-06-05 ENCOUNTER — Ambulatory Visit (INDEPENDENT_AMBULATORY_CARE_PROVIDER_SITE_OTHER): Payer: Medicare HMO | Admitting: Family Medicine

## 2020-06-05 ENCOUNTER — Other Ambulatory Visit: Payer: Self-pay

## 2020-06-05 ENCOUNTER — Encounter: Payer: Self-pay | Admitting: Family Medicine

## 2020-06-05 VITALS — BP 120/70 | HR 95 | Temp 98.6°F | Wt 202.2 lb

## 2020-06-05 DIAGNOSIS — L02422 Furuncle of left axilla: Secondary | ICD-10-CM

## 2020-06-05 MED ORDER — CEPHALEXIN 500 MG PO CAPS
500.0000 mg | ORAL_CAPSULE | Freq: Three times a day (TID) | ORAL | 0 refills | Status: AC
Start: 2020-06-05 — End: 2020-06-15

## 2020-06-05 NOTE — Progress Notes (Signed)
   Subjective:    Patient ID: [name], female    DOB: Aug 14, 1951, 69 y.o.   MRN: [id number]  HPI Here for 2 weeks of a tender lump in the left arm pit. No fever. She opened it with a needle and some fluid drained out.    Review of Systems  Constitutional: Negative.   Respiratory: Negative.   Cardiovascular: Negative.        Objective:   Physical Exam Constitutional:      Appearance: Normal appearance.  Cardiovascular:     Rate and Rhythm: Normal rate and regular rhythm.     Pulses: Normal pulses.     Heart sounds: Normal heart sounds.  Pulmonary:     Effort: Pulmonary effort is normal.     Breath sounds: Normal breath sounds.  Skin:    Comments: Small tender boil in the left axilla   Neurological:     Mental Status: She is alert.           Assessment & Plan:  Boil, treat with Keflex.  Alysia Penna, MD

## 2020-06-10 DIAGNOSIS — J3089 Other allergic rhinitis: Secondary | ICD-10-CM | POA: Diagnosis not present

## 2020-06-18 DIAGNOSIS — J301 Allergic rhinitis due to pollen: Secondary | ICD-10-CM | POA: Diagnosis not present

## 2020-06-18 DIAGNOSIS — J3089 Other allergic rhinitis: Secondary | ICD-10-CM | POA: Diagnosis not present

## 2020-06-25 DIAGNOSIS — J3089 Other allergic rhinitis: Secondary | ICD-10-CM | POA: Diagnosis not present

## 2020-07-02 DIAGNOSIS — J3089 Other allergic rhinitis: Secondary | ICD-10-CM | POA: Diagnosis not present

## 2020-07-09 DIAGNOSIS — J3089 Other allergic rhinitis: Secondary | ICD-10-CM | POA: Diagnosis not present

## 2020-07-16 DIAGNOSIS — J3089 Other allergic rhinitis: Secondary | ICD-10-CM | POA: Diagnosis not present

## 2020-07-30 DIAGNOSIS — J3089 Other allergic rhinitis: Secondary | ICD-10-CM | POA: Diagnosis not present

## 2020-08-05 DIAGNOSIS — J3089 Other allergic rhinitis: Secondary | ICD-10-CM | POA: Diagnosis not present

## 2020-08-08 DIAGNOSIS — J3089 Other allergic rhinitis: Secondary | ICD-10-CM | POA: Diagnosis not present

## 2020-08-13 DIAGNOSIS — J3089 Other allergic rhinitis: Secondary | ICD-10-CM | POA: Diagnosis not present

## 2020-08-15 DIAGNOSIS — J3089 Other allergic rhinitis: Secondary | ICD-10-CM | POA: Diagnosis not present

## 2020-08-15 DIAGNOSIS — J3081 Allergic rhinitis due to animal (cat) (dog) hair and dander: Secondary | ICD-10-CM | POA: Diagnosis not present

## 2020-08-19 DIAGNOSIS — J3089 Other allergic rhinitis: Secondary | ICD-10-CM | POA: Diagnosis not present

## 2020-08-19 DIAGNOSIS — J3081 Allergic rhinitis due to animal (cat) (dog) hair and dander: Secondary | ICD-10-CM | POA: Diagnosis not present

## 2020-08-19 DIAGNOSIS — J301 Allergic rhinitis due to pollen: Secondary | ICD-10-CM | POA: Diagnosis not present

## 2020-08-21 DIAGNOSIS — J3089 Other allergic rhinitis: Secondary | ICD-10-CM | POA: Diagnosis not present

## 2020-08-26 ENCOUNTER — Other Ambulatory Visit: Payer: Self-pay

## 2020-08-26 ENCOUNTER — Ambulatory Visit (INDEPENDENT_AMBULATORY_CARE_PROVIDER_SITE_OTHER): Payer: Medicare HMO

## 2020-08-26 DIAGNOSIS — J3089 Other allergic rhinitis: Secondary | ICD-10-CM | POA: Diagnosis not present

## 2020-08-26 DIAGNOSIS — Z23 Encounter for immunization: Secondary | ICD-10-CM | POA: Diagnosis not present

## 2020-09-03 ENCOUNTER — Ambulatory Visit (INDEPENDENT_AMBULATORY_CARE_PROVIDER_SITE_OTHER): Payer: Medicare HMO

## 2020-09-03 ENCOUNTER — Other Ambulatory Visit: Payer: Self-pay

## 2020-09-03 DIAGNOSIS — Z0000 Encounter for general adult medical examination without abnormal findings: Secondary | ICD-10-CM | POA: Diagnosis not present

## 2020-09-03 DIAGNOSIS — J3089 Other allergic rhinitis: Secondary | ICD-10-CM | POA: Diagnosis not present

## 2020-09-03 NOTE — Progress Notes (Signed)
Subjective:   [name] is a 69 y.o. female who presents for an Initial Medicare Annual Wellness Visit.  I connected with [name] today by telephone and verified that I am speaking with the correct person using two identifiers. Location patient: home Location provider: work Persons participating in the virtual visit: patient, provider.   I discussed the limitations, risks, security and privacy concerns of performing an evaluation and management service by telephone and the availability of in person appointments. I also discussed with the patient that there may be a patient responsible charge related to this service. The patient expressed understanding and verbally consented to this telephonic visit.    Interactive audio and video telecommunications were attempted between this provider and patient, however failed, due to patient having technical difficulties OR patient did not have access to video capability.  We continued and completed visit with audio only.      Review of Systems    N/A  Cardiac Risk Factors include: advanced age (>32men, >80 women);hypertension     Objective:    There were no vitals filed for this visit. There is no height or weight on file to calculate BMI.  Advanced Directives 09/03/2020 04/30/2016 06/06/2014  Does Patient Have a Medical Advance Directive? Yes;No No Patient does not have advance directive  Does patient want to make changes to medical advance directive? No - Patient declined - -  Would patient like information on creating a medical advance directive? No - Patient declined No - patient declined information -    Current Medications (verified) Outpatient Encounter Medications as of 09/03/2020  Medication Sig  . albuterol (PROAIR HFA) 108 (90 Base) MCG/ACT inhaler INHALE 2 PUFFS INTO THE LUNGS EVERY 6 HOURS AS NEEDED FOR WHEEZING.  Marland Kitchen aspirin 81 MG tablet Take 81 mg by mouth daily.    . budesonide-formoterol (SYMBICORT) 80-4.5  MCG/ACT inhaler 2 puffs then rinse mouth, twice daily maintenance inhaler  . Flaxseed, Linseed, (FLAX SEED OIL) 1000 MG CAPS Take 1 capsule by mouth daily.    . fluticasone (FLONASE) 50 MCG/ACT nasal spray 1-2 sprays each nostril once or twice daily  . ketoconazole (NIZORAL) 2 % cream Apply 1 application topically daily.  Marland Kitchen lisinopril-hydrochlorothiazide (ZESTORETIC) 20-12.5 MG tablet TAKE 1 TABLET EVERY DAY  . LORazepam (ATIVAN) 1 MG tablet Take 1 tablet (1 mg total) by mouth every 8 (eight) hours as needed for anxiety.  . montelukast (SINGULAIR) 10 MG tablet Take 10 mg by mouth at bedtime.  . NON FORMULARY Allergy vaccine. Once a week   . omeprazole (PRILOSEC) 20 MG capsule Take 1 capsule (20 mg total) by mouth daily.  . potassium chloride SA (KLOR-CON) 20 MEQ tablet Take 1 tablet (20 mEq total) by mouth daily.  Marland Kitchen pyridOXINE (VITAMIN B-6) 100 MG tablet Take 100 mg by mouth daily.    . vitamin B-12 (CYANOCOBALAMIN) 100 MCG tablet Take 100 mcg by mouth daily.  . Diphenhyd-Hydrocort-Nystatin (FIRST-DUKES MOUTHWASH) SUSP Swish and swallow 25mLs three times daily (Patient not taking: Reported on 09/03/2020)  . EPINEPHrine 0.3 mg/0.3 mL IJ SOAJ injection Use as directed for severe allergic reaction (Patient not taking: Reported on 09/03/2020)  . HYDROcodone-homatropine (HYCODAN) 5-1.5 MG/5ML syrup Take 5 mLs by mouth every 4 (four) hours as needed for cough. (Patient not taking: Reported on 09/03/2020)  . [DISCONTINUED] cholestyramine light (PREVALITE) 4 g packet Take 1 packet (4 g total) by mouth 2 (two) times daily.   No facility-administered encounter medications on file as of 09/03/2020.  Allergies (verified) Doxycycline   History: Past Medical History:  Diagnosis Date  . Allergy   . Asthma    sees Dr. Baird Lyons  . Colon polyp 2007  . Depression   . Diverticulosis   . GERD (gastroesophageal reflux disease)   . Gynecological examination    sees Dr. Paula Compton  .  Hyperlipemia   . Hypertension    Past Surgical History:  Procedure Laterality Date  . CARPAL TUNNEL RELEASE Right 1980  . COLONOSCOPY  06-20-14   per Dr. Olevia Perches, adenomatous polyp,repeat in 5 yrs   . DILATION AND CURETTAGE OF UTERUS  2005   Family History  Problem Relation Age of Onset  . Colon cancer Mother 3  . Asthma Mother   . Colon cancer Father 70  . Liver cancer Father   . Throat cancer Brother   . Colon cancer Maternal Aunt        not sure age of onset  . Colon cancer Maternal Aunt        not sure age of onset  . Diabetes Sister   . Pancreatic cancer Neg Hx   . Rectal cancer Neg Hx   . Stomach cancer Neg Hx   . Allergic rhinitis Neg Hx   . Angioedema Neg Hx   . Eczema Neg Hx    Social History   Socioeconomic History  . Marital status: Divorced    Spouse name: Not on file  . Number of children: 2  . Years of education: Not on file  . Highest education level: Not on file  Occupational History    Employer: [employer]  Tobacco Use  . Smoking status: Never Smoker  . Smokeless tobacco: Never Used  Substance and Sexual Activity  . Alcohol use: Yes    Alcohol/week: 0.0 standard drinks    Comment: occ  . Drug use: No  . Sexual activity: Not on file  Other Topics Concern  . Not on file  Social History Narrative   Daily caffeine    Social Determinants of Health   Financial Resource Strain: Low Risk   . Difficulty of Paying Living Expenses: Not hard at all  Food Insecurity: No Food Insecurity  . Worried About Charity fundraiser in the Last Year: Never true  . Ran Out of Food in the Last Year: Never true  Transportation Needs: No Transportation Needs  . Lack of Transportation (Medical): No  . Lack of Transportation (Non-Medical): No  Physical Activity: Insufficiently Active  . Days of Exercise per Week: 7 days  . Minutes of Exercise per Session: 20 min  Stress: No Stress Concern Present  . Feeling of Stress : Not at all  Social Connections:  Moderately Isolated  . Frequency of Communication with Friends and Family: More than three times a week  . Frequency of Social Gatherings with Friends and Family: More than three times a week  . Attends Religious Services: Never  . Active Member of Clubs or Organizations: No  . Attends Archivist Meetings: Never  . Marital Status: Married    Tobacco Counseling Counseling given: Not Answered   Clinical Intake:  Pre-visit preparation completed: Yes  Pain : No/denies pain     Nutritional Risks: None Diabetes: No  How often do you need to have someone help you when you read instructions, pamphlets, or other written materials from your doctor or pharmacy?: 1 - Never What is the last grade level you completed in school?: 12th Grade  Diabetic?No   Interpreter Needed?: No  Information entered by :: [place] of Daily Living In your present state of health, do you have any difficulty performing the following activities: 09/03/2020  Hearing? Y  Comment has difficulties with hearing at times  Vision? N  Difficulty concentrating or making decisions? Y  Comment Has issues with remembering at times  Walking or climbing stairs? N  Dressing or bathing? N  Doing errands, shopping? N  Preparing Food and eating ? N  Using the Toilet? N  In the past six months, have you accidently leaked urine? N  Do you have problems with loss of bowel control? N  Managing your Medications? N  Managing your Finances? N  Housekeeping or managing your Housekeeping? N  Some recent data might be hidden    Patient Care Team: Laurey Morale, MD as PCP - General  Indicate any recent Medical Services you may have received from other than [facility] providers in the past year (date may be approximate).     Assessment:   This is a routine wellness examination for [name].  Hearing/Vision screen  Hearing Screening   125Hz  250Hz  500Hz  1000Hz  2000Hz  3000Hz  4000Hz  6000Hz  8000Hz     Right ear:           Left ear:           Vision Screening Comments: Patient states gets eyes examined once per year  Dietary issues and exercise activities discussed: Current Exercise Habits: Home exercise routine, Type of exercise: walking, Time (Minutes): 20, Frequency (Times/Week): 7, Weekly Exercise (Minutes/Week): 140, Intensity: Mild, Exercise limited by: None identified  Goals    . Exercise 3x per week (30 min per time)    . Weight (lb) < 150 lb (68 kg)      Depression Screen PHQ 2/9 Scores 09/03/2020 11/05/2015 10/06/2015  PHQ - 2 Score 0 0 0  PHQ- 9 Score 0 - -    Fall Risk Fall Risk  09/03/2020 03/21/2018 05/14/2016  Falls in the past year? 0 No Yes  Number falls in past yr: 0 - 1  Injury with Fall? 0 - Yes  Risk for fall due to : No Fall Risks - -  Follow up Falls evaluation completed;Falls prevention discussed - -    Any stairs in or around the home? No  If so, are there any without handrails? No  Home free of loose throw rugs in walkways, pet beds, electrical cords, etc? Yes  Adequate lighting in your home to reduce risk of falls? Yes   ASSISTIVE DEVICES UTILIZED TO PREVENT FALLS:  Life alert? No  Use of a cane, walker or w/c? No  Grab bars in the bathroom? Yes  Shower chair or bench in shower? Yes  Elevated toilet seat or a handicapped toilet? No     Cognitive Function:     6CIT Screen 09/03/2020  What Year? 0 points  What month? 0 points  What time? 0 points  Count back from 20 0 points  Months in reverse 0 points  Repeat phrase 2 points  Total Score 2    Immunizations Immunization History  Administered Date(s) Administered  . Fluad Quad(high Dose 65+) 08/24/2019, 08/26/2020  . Influenza Split 08/10/2011, 08/09/2012, 08/02/2013  . Influenza Whole 11/09/2004  . Influenza,inj,Quad PF,6+ Mos 09/14/2016, 08/02/2018  . Influenza-Unspecified 08/14/2014, 08/07/2015  . PFIZER SARS-COV-2 Vaccination 12/14/2019, 01/04/2020  . Pneumococcal  Conjugate-13 03/10/2016  . Pneumococcal Polysaccharide-23 11/09/2004, 09/08/2010  . Td 11/09/2002  . Tdap  05/21/2013    TDAP status: Up to date Flu Vaccine status: Up to date Pneumococcal vaccine status: Up to date Covid-19 vaccine status: Completed vaccines  Qualifies for Shingles Vaccine? Yes   Zostavax completed No   Shingrix Completed?: No.    Education has been provided regarding the importance of this vaccine. Patient has been advised to call insurance company to determine out of pocket expense if they have not yet received this vaccine. Advised may also receive vaccine at local pharmacy or Health Dept. Verbalized acceptance and understanding.  Screening Tests Health Maintenance  Topic Date Due  . Hepatitis C Screening  Never done  . PNA vac Low Risk Adult (2 of 2 - PPSV23) 02/22/2018  . COLONOSCOPY  09/11/2019  . MAMMOGRAM  08/05/2020  . TETANUS/TDAP  05/22/2023  . INFLUENZA VACCINE  Completed  . DEXA SCAN  Completed  . COVID-19 Vaccine  Completed    Health Maintenance  Health Maintenance Due  Topic Date Due  . Hepatitis C Screening  Never done  . PNA vac Low Risk Adult (2 of 2 - PPSV23) 02/22/2018  . COLONOSCOPY  09/11/2019  . MAMMOGRAM  08/05/2020    Colorectal cancer screening: Referral to GI placed 09/03/2020. Pt aware the office will call re: appt. Mammogram status: Completed 08/05/2018. Patient gets these done through GYN. Repeat every year Bone Density status: Completed 06/09/2012. Results reflect: Bone density results: NORMAL. Repeat every 0 years.  Lung Cancer Screening: (Low Dose CT Chest recommended if Age 30-80 years, 30 pack-year currently smoking OR have quit w/in 15years.) does not qualify.   Lung Cancer Screening Referral: N/A  Additional Screening:  Hepatitis C Screening: does qualify;   Vision Screening: Recommended annual ophthalmology exams for early detection of glaucoma and other disorders of the eye. Is the patient up to date with their  annual eye exam?  Yes  Who is the provider or what is the name of the office in which the patient attends annual eye exams? Dr. Herbert Deaner If pt is not established with a provider, would they like to be referred to a provider to establish care? No .   Dental Screening: Recommended annual dental exams for proper oral hygiene  Community Resource Referral / Chronic Care Management: CRR required this visit?  No   CCM required this visit?  No      Plan:     I have personally reviewed and noted the following in the patient's chart:   . Medical and social history . Use of alcohol, tobacco or illicit drugs  . Current medications and supplements . Functional ability and status . Nutritional status . Physical activity . Advanced directives . List of other physicians . Hospitalizations, surgeries, and ER visits in previous 12 months . Vitals . Screenings to include cognitive, depression, and falls . Referrals and appointments  In addition, I have reviewed and discussed with patient certain preventive protocols, quality metrics, and best practice recommendations. A written personalized care plan for preventive services as well as general preventive health recommendations were provided to patient.     Ofilia Neas, LPN   [id number]   Nurse Notes: None

## 2020-09-03 NOTE — Patient Instructions (Signed)
[name] , Thank you for taking time to come for your Medicare Wellness Visit. I appreciate your ongoing commitment to your health goals. Please review the following plan we discussed and let me know if I can assist you in the future.   Screening recommendations/referrals: Colonoscopy: Currently due, please let us know when you are ready to get scheduled  Mammogram: Currently due, you may receive this as usual through Dr.Richardson's office Bone Density: No longer required Recommended yearly ophthalmology/optometry visit for glaucoma screening and checkup Recommended yearly dental visit for hygiene and checkup  Vaccinations: Influenza vaccine: Up to date, next due fall 2022 Pneumococcal vaccine: Completed series Tdap vaccine: Up to date, next due 05/22/2023 Shingles vaccine: Currently due, you may receive this at your local pharmacy     Advanced directives: Advance directive discussed with you today. Even though you declined this today please call our office should you change your mind and we can give you the proper paperwork for you to fill out.   Conditions/risks identified: None   Next appointment: 09/04/2021 @ 10:30 am with [place] for Medicare Wellness visit    Preventive Care 32 Years and Older, Female Preventive care refers to lifestyle choices and visits with your health care provider that can promote health and wellness. What does preventive care include?  A yearly physical exam. This is also called an annual well check.  Dental exams once or twice a year.  Routine eye exams. Ask your health care provider how often you should have your eyes checked.  Personal lifestyle choices, including:  Daily care of your teeth and gums.  Regular physical activity.  Eating a healthy diet.  Avoiding tobacco and drug use.  Limiting alcohol use.  Practicing safe sex.  Taking low-dose aspirin every day.  Taking vitamin and mineral supplements as  recommended by your health care provider. What happens during an annual well check? The services and screenings done by your health care provider during your annual well check will depend on your age, overall health, lifestyle risk factors, and family history of disease. Counseling  Your health care provider may ask you questions about your:  Alcohol use.  Tobacco use.  Drug use.  Emotional well-being.  Home and relationship well-being.  Sexual activity.  Eating habits.  History of falls.  Memory and ability to understand (cognition).  Work and work Statistician.  Reproductive health. Screening  You may have the following tests or measurements:  Height, weight, and BMI.  Blood pressure.  Lipid and cholesterol levels. These may be checked every 5 years, or more frequently if you are over 43 years old.  Skin check.  Lung cancer screening. You may have this screening every year starting at age 29 if you have a 30-pack-year history of smoking and currently smoke or have quit within the past 15 years.  Fecal occult blood test (FOBT) of the stool. You may have this test every year starting at age 40.  Flexible sigmoidoscopy or colonoscopy. You may have a sigmoidoscopy every 5 years or a colonoscopy every 10 years starting at age 68.  Hepatitis C blood test.  Hepatitis B blood test.  Sexually transmitted disease (STD) testing.  Diabetes screening. This is done by checking your blood sugar (glucose) after you have not eaten for a while (fasting). You may have this done every 1-3 years.  Bone density scan. This is done to screen for osteoporosis. You may have this done starting at age 16.  Mammogram. This  may be done every 1-2 years. Talk to your health care provider about how often you should have regular mammograms. Talk with your health care provider about your test results, treatment options, and if necessary, the need for more tests. Vaccines  Your health care  provider may recommend certain vaccines, such as:  Influenza vaccine. This is recommended every year.  Tetanus, diphtheria, and acellular pertussis (Tdap, Td) vaccine. You may need a Td booster every 10 years.  Zoster vaccine. You may need this after age 75.  Pneumococcal 13-valent conjugate (PCV13) vaccine. One dose is recommended after age 76.  Pneumococcal polysaccharide (PPSV23) vaccine. One dose is recommended after age 14. Talk to your health care provider about which screenings and vaccines you need and how often you need them. This information is not intended to replace advice given to you by your health care provider. Make sure you discuss any questions you have with your health care provider. Document Released: 11/22/2015 Document Revised: 07/15/2016 Document Reviewed: 08/27/2015 Elsevier Interactive Patient Education  2017 [place] Prevention in the Home Falls can cause injuries. They can happen to people of all ages. There are many things you can do to make your home safe and to help prevent falls. What can I do on the outside of my home?  Regularly fix the edges of walkways and driveways and fix any cracks.  Remove anything that might make you trip as you walk through a door, such as a raised step or threshold.  Trim any bushes or trees on the path to your home.  Use bright outdoor lighting.  Clear any walking paths of anything that might make someone trip, such as rocks or tools.  Regularly check to see if handrails are loose or broken. Make sure that both sides of any steps have handrails.  Any raised decks and porches should have guardrails on the edges.  Have any leaves, snow, or ice cleared regularly.  Use sand or salt on walking paths during winter.  Clean up any spills in your garage right away. This includes oil or grease spills. What can I do in the bathroom?  Use night lights.  Install grab bars by the toilet and in the tub and shower. Do  not use towel bars as grab bars.  Use non-skid mats or decals in the tub or shower.  If you need to sit down in the shower, use a plastic, non-slip stool.  Keep the floor dry. Clean up any water that spills on the floor as soon as it happens.  Remove soap buildup in the tub or shower regularly.  Attach bath mats securely with double-sided non-slip rug tape.  Do not have throw rugs and other things on the floor that can make you trip. What can I do in the bedroom?  Use night lights.  Make sure that you have a light by your bed that is easy to reach.  Do not use any sheets or blankets that are too big for your bed. They should not hang down onto the floor.  Have a firm chair that has side arms. You can use this for support while you get dressed.  Do not have throw rugs and other things on the floor that can make you trip. What can I do in the kitchen?  Clean up any spills right away.  Avoid walking on wet floors.  Keep items that you use a lot in easy-to-reach places.  If you need to reach something above  you, use a strong step stool that has a grab bar.  Keep electrical cords out of the way.  Do not use floor polish or wax that makes floors slippery. If you must use wax, use non-skid floor wax.  Do not have throw rugs and other things on the floor that can make you trip. What can I do with my stairs?  Do not leave any items on the stairs.  Make sure that there are handrails on both sides of the stairs and use them. Fix handrails that are broken or loose. Make sure that handrails are as long as the stairways.  Check any carpeting to make sure that it is firmly attached to the stairs. Fix any carpet that is loose or worn.  Avoid having throw rugs at the top or bottom of the stairs. If you do have throw rugs, attach them to the floor with carpet tape.  Make sure that you have a light switch at the top of the stairs and the bottom of the stairs. If you do not have them,  ask someone to add them for you. What else can I do to help prevent falls?  Wear shoes that:  Do not have high heels.  Have rubber bottoms.  Are comfortable and fit you well.  Are closed at the toe. Do not wear sandals.  If you use a stepladder:  Make sure that it is fully opened. Do not climb a closed stepladder.  Make sure that both sides of the stepladder are locked into place.  Ask someone to hold it for you, if possible.  Clearly mark and make sure that you can see:  Any grab bars or handrails.  First and last steps.  Where the edge of each step is.  Use tools that help you move around (mobility aids) if they are needed. These include:  Canes.  Walkers.  Scooters.  Crutches.  Turn on the lights when you go into a dark area. Replace any light bulbs as soon as they burn out.  Set up your furniture so you have a clear path. Avoid moving your furniture around.  If any of your floors are uneven, fix them.  If there are any pets around you, be aware of where they are.  Review your medicines with your doctor. Some medicines can make you feel dizzy. This can increase your chance of falling. Ask your doctor what other things that you can do to help prevent falls. This information is not intended to replace advice given to you by your health care provider. Make sure you discuss any questions you have with your health care provider. Document Released: 08/22/2009 Document Revised: 04/02/2016 Document Reviewed: 11/30/2014 Elsevier Interactive Patient Education  2017 [employer].

## 2020-09-10 DIAGNOSIS — J3089 Other allergic rhinitis: Secondary | ICD-10-CM | POA: Diagnosis not present

## 2020-09-16 DIAGNOSIS — J3089 Other allergic rhinitis: Secondary | ICD-10-CM | POA: Diagnosis not present

## 2020-09-19 ENCOUNTER — Other Ambulatory Visit: Payer: Self-pay | Admitting: Family Medicine

## 2020-09-19 NOTE — Telephone Encounter (Signed)
Refill request for pending medication last OV 06/05/20 last refill May 2021. Please advise.

## 2020-09-19 NOTE — Telephone Encounter (Signed)
Patient is calling and requesting a refill for LORazepam (ATIVAN) 1 MG tablet sent to  [place], [place] [address]  Phone:  [phone] Fax:  [phone]  CB is [phone]

## 2020-09-20 MED ORDER — LORAZEPAM 1 MG PO TABS: 1.0000 mg | ORAL_TABLET | Freq: Three times a day (TID) | ORAL | 1 refills | Status: DC | PRN

## 2020-09-23 DIAGNOSIS — J3089 Other allergic rhinitis: Secondary | ICD-10-CM | POA: Diagnosis not present

## 2020-09-30 DIAGNOSIS — J4540 Moderate persistent asthma, uncomplicated: Secondary | ICD-10-CM | POA: Diagnosis not present

## 2020-09-30 DIAGNOSIS — J3081 Allergic rhinitis due to animal (cat) (dog) hair and dander: Secondary | ICD-10-CM | POA: Diagnosis not present

## 2020-09-30 DIAGNOSIS — J3089 Other allergic rhinitis: Secondary | ICD-10-CM | POA: Diagnosis not present

## 2020-10-01 ENCOUNTER — Telehealth: Payer: Self-pay | Admitting: Family Medicine

## 2020-10-01 MED ORDER — CEPHALEXIN 500 MG PO CAPS
500.0000 mg | ORAL_CAPSULE | Freq: Three times a day (TID) | ORAL | 0 refills | Status: AC
Start: 2020-10-01 — End: 2020-10-11

## 2020-10-01 NOTE — Telephone Encounter (Signed)
Call this in for TID for 10 days

## 2020-10-01 NOTE — Telephone Encounter (Signed)
Rx sent in, pt is aware. 

## 2020-10-01 NOTE — Telephone Encounter (Signed)
Patient has a spot coming back up under her arm that she seen Dr. Sarajane Jews for in July. She needs cephALEXin (KEFLEX) 500 MG capsule sent to   [id number] - [place], [place] [address] Phone:  [phone]  Fax:  [phone]

## 2020-10-01 NOTE — Addendum Note (Signed)
Addended by: Nathanial Millman E on: 10/01/2020 01:06 PM   Modules accepted: Orders

## 2020-10-08 DIAGNOSIS — J3089 Other allergic rhinitis: Secondary | ICD-10-CM | POA: Diagnosis not present

## 2020-10-17 DIAGNOSIS — J3089 Other allergic rhinitis: Secondary | ICD-10-CM | POA: Diagnosis not present

## 2020-10-21 DIAGNOSIS — Z01419 Encounter for gynecological examination (general) (routine) without abnormal findings: Secondary | ICD-10-CM | POA: Diagnosis not present

## 2020-10-21 DIAGNOSIS — B977 Papillomavirus as the cause of diseases classified elsewhere: Secondary | ICD-10-CM | POA: Diagnosis not present

## 2020-10-21 DIAGNOSIS — Z1231 Encounter for screening mammogram for malignant neoplasm of breast: Secondary | ICD-10-CM | POA: Diagnosis not present

## 2020-10-23 DIAGNOSIS — J3089 Other allergic rhinitis: Secondary | ICD-10-CM | POA: Diagnosis not present

## 2020-10-29 DIAGNOSIS — J3089 Other allergic rhinitis: Secondary | ICD-10-CM | POA: Diagnosis not present

## 2020-11-05 DIAGNOSIS — J3089 Other allergic rhinitis: Secondary | ICD-10-CM | POA: Diagnosis not present

## 2020-11-12 ENCOUNTER — Telehealth: Payer: Self-pay | Admitting: Family Medicine

## 2020-11-12 DIAGNOSIS — J3089 Other allergic rhinitis: Secondary | ICD-10-CM | POA: Diagnosis not present

## 2020-11-12 NOTE — Telephone Encounter (Signed)
Pt is calling in stating that she need a refill on Rx's fluticasone (FLONASE) 50 MCG and Lorazepam (ATIVAN) 1 MG  Pharm:  [facility]

## 2020-11-14 MED ORDER — FLUTICASONE PROPIONATE 50 MCG/ACT NA SUSP: NASAL | 3 refills | Status: DC

## 2020-11-14 MED ORDER — LORAZEPAM 1 MG PO TABS: 1.0000 mg | ORAL_TABLET | Freq: Three times a day (TID) | ORAL | 1 refills | Status: DC | PRN

## 2020-11-14 NOTE — Telephone Encounter (Signed)
Patient informed of the message below.

## 2020-11-14 NOTE — Telephone Encounter (Signed)
Done

## 2020-11-19 DIAGNOSIS — J3089 Other allergic rhinitis: Secondary | ICD-10-CM | POA: Diagnosis not present

## 2020-11-20 ENCOUNTER — Other Ambulatory Visit: Payer: Self-pay

## 2020-11-20 MED ORDER — FLUTICASONE PROPIONATE 50 MCG/ACT NA SUSP: NASAL | 3 refills | Status: DC

## 2020-11-20 NOTE — Telephone Encounter (Signed)
[name], clarification of prescription given

## 2020-11-20 NOTE — Telephone Encounter (Signed)
[name] is calling from [place] is calling and needed to get clarification for Fluticasone Propionate, please advise. CB is [phone]

## 2020-11-26 DIAGNOSIS — J3089 Other allergic rhinitis: Secondary | ICD-10-CM | POA: Diagnosis not present

## 2020-11-28 DIAGNOSIS — L814 Other melanin hyperpigmentation: Secondary | ICD-10-CM | POA: Diagnosis not present

## 2020-11-28 DIAGNOSIS — D1801 Hemangioma of skin and subcutaneous tissue: Secondary | ICD-10-CM | POA: Diagnosis not present

## 2020-11-28 DIAGNOSIS — L821 Other seborrheic keratosis: Secondary | ICD-10-CM | POA: Diagnosis not present

## 2020-11-28 DIAGNOSIS — D225 Melanocytic nevi of trunk: Secondary | ICD-10-CM | POA: Diagnosis not present

## 2020-11-28 DIAGNOSIS — L720 Epidermal cyst: Secondary | ICD-10-CM | POA: Diagnosis not present

## 2020-11-28 DIAGNOSIS — L918 Other hypertrophic disorders of the skin: Secondary | ICD-10-CM | POA: Diagnosis not present

## 2020-12-03 DIAGNOSIS — J3089 Other allergic rhinitis: Secondary | ICD-10-CM | POA: Diagnosis not present

## 2020-12-04 DIAGNOSIS — J3089 Other allergic rhinitis: Secondary | ICD-10-CM | POA: Diagnosis not present

## 2020-12-10 DIAGNOSIS — J3089 Other allergic rhinitis: Secondary | ICD-10-CM | POA: Diagnosis not present

## 2020-12-17 DIAGNOSIS — J301 Allergic rhinitis due to pollen: Secondary | ICD-10-CM | POA: Diagnosis not present

## 2020-12-17 DIAGNOSIS — J3089 Other allergic rhinitis: Secondary | ICD-10-CM | POA: Diagnosis not present

## 2020-12-24 DIAGNOSIS — J3089 Other allergic rhinitis: Secondary | ICD-10-CM | POA: Diagnosis not present

## 2020-12-30 ENCOUNTER — Other Ambulatory Visit: Payer: Self-pay | Admitting: Family Medicine

## 2020-12-31 DIAGNOSIS — J3089 Other allergic rhinitis: Secondary | ICD-10-CM | POA: Diagnosis not present

## 2020-12-31 DIAGNOSIS — J301 Allergic rhinitis due to pollen: Secondary | ICD-10-CM | POA: Diagnosis not present

## 2021-01-01 DIAGNOSIS — H524 Presbyopia: Secondary | ICD-10-CM | POA: Diagnosis not present

## 2021-01-01 DIAGNOSIS — H04123 Dry eye syndrome of bilateral lacrimal glands: Secondary | ICD-10-CM | POA: Diagnosis not present

## 2021-01-01 DIAGNOSIS — Z961 Presence of intraocular lens: Secondary | ICD-10-CM | POA: Diagnosis not present

## 2021-01-02 DIAGNOSIS — J301 Allergic rhinitis due to pollen: Secondary | ICD-10-CM | POA: Diagnosis not present

## 2021-01-02 DIAGNOSIS — J3089 Other allergic rhinitis: Secondary | ICD-10-CM | POA: Diagnosis not present

## 2021-01-07 DIAGNOSIS — J3089 Other allergic rhinitis: Secondary | ICD-10-CM | POA: Diagnosis not present

## 2021-01-09 DIAGNOSIS — J301 Allergic rhinitis due to pollen: Secondary | ICD-10-CM | POA: Diagnosis not present

## 2021-01-09 DIAGNOSIS — J3089 Other allergic rhinitis: Secondary | ICD-10-CM | POA: Diagnosis not present

## 2021-01-13 DIAGNOSIS — J3089 Other allergic rhinitis: Secondary | ICD-10-CM | POA: Diagnosis not present

## 2021-01-20 DIAGNOSIS — J3089 Other allergic rhinitis: Secondary | ICD-10-CM | POA: Diagnosis not present

## 2021-01-27 DIAGNOSIS — J3089 Other allergic rhinitis: Secondary | ICD-10-CM | POA: Diagnosis not present

## 2021-02-03 DIAGNOSIS — J3089 Other allergic rhinitis: Secondary | ICD-10-CM | POA: Diagnosis not present

## 2021-02-03 DIAGNOSIS — J301 Allergic rhinitis due to pollen: Secondary | ICD-10-CM | POA: Diagnosis not present

## 2021-02-10 DIAGNOSIS — J3089 Other allergic rhinitis: Secondary | ICD-10-CM | POA: Diagnosis not present

## 2021-02-17 DIAGNOSIS — J301 Allergic rhinitis due to pollen: Secondary | ICD-10-CM | POA: Diagnosis not present

## 2021-02-17 DIAGNOSIS — J3089 Other allergic rhinitis: Secondary | ICD-10-CM | POA: Diagnosis not present

## 2021-02-25 DIAGNOSIS — J3089 Other allergic rhinitis: Secondary | ICD-10-CM | POA: Diagnosis not present

## 2021-03-04 DIAGNOSIS — J3089 Other allergic rhinitis: Secondary | ICD-10-CM | POA: Diagnosis not present

## 2021-03-11 ENCOUNTER — Other Ambulatory Visit: Payer: Self-pay

## 2021-03-12 ENCOUNTER — Ambulatory Visit (INDEPENDENT_AMBULATORY_CARE_PROVIDER_SITE_OTHER): Payer: Medicare HMO | Admitting: Family Medicine

## 2021-03-12 ENCOUNTER — Encounter: Payer: Self-pay | Admitting: Family Medicine

## 2021-03-12 VITALS — BP 122/74 | HR 80 | Temp 98.2°F | Ht 61.0 in | Wt 201.0 lb

## 2021-03-12 DIAGNOSIS — Z0000 Encounter for general adult medical examination without abnormal findings: Secondary | ICD-10-CM

## 2021-03-12 DIAGNOSIS — J3089 Other allergic rhinitis: Secondary | ICD-10-CM | POA: Diagnosis not present

## 2021-03-12 LAB — LIPID PANEL
Cholesterol: 195 mg/dL (ref 0–200)
HDL: 44 mg/dL (ref >39.00)
LDL Cholesterol: 114 mg/dL — ABNORMAL HIGH (ref 0–99)
NonHDL: 150.99
Total CHOL/HDL Ratio: 4
Triglycerides: 183 mg/dL — ABNORMAL HIGH (ref 0.0–149.0)
VLDL: 36.6 mg/dL (ref 0.0–40.0)

## 2021-03-12 LAB — CBC WITH DIFFERENTIAL/PLATELET
Basophils Absolute: 0.1 10*3/uL (ref 0.0–0.1)
Basophils Relative: 1.1 % (ref 0.0–3.0)
Eosinophils Absolute: 0.1 10*3/uL (ref 0.0–0.7)
Eosinophils Relative: 1.9 % (ref 0.0–5.0)
HCT: 36.7 % (ref 36.0–46.0)
Hemoglobin: 12.6 g/dL (ref 12.0–15.0)
Lymphocytes Relative: 20.7 % (ref 12.0–46.0)
Lymphs Abs: 1.6 10*3/uL (ref 0.7–4.0)
MCHC: 34.4 g/dL (ref 30.0–36.0)
MCV: 90.5 fl (ref 78.0–100.0)
Monocytes Absolute: 0.4 10*3/uL (ref 0.1–1.0)
Monocytes Relative: 5.4 % (ref 3.0–12.0)
Neutro Abs: 5.4 10*3/uL (ref 1.4–7.7)
Neutrophils Relative %: 70.9 % (ref 43.0–77.0)
Platelets: 206 10*3/uL (ref 150.0–400.0)
RBC: 4.06 Mil/uL (ref 3.87–5.11)
RDW: 14 % (ref 11.5–15.5)
WBC: 7.6 10*3/uL (ref 4.0–10.5)

## 2021-03-12 LAB — HEPATIC FUNCTION PANEL
ALT: 14 U/L (ref 0–35)
AST: 14 U/L (ref 0–37)
Albumin: 4.2 g/dL (ref 3.5–5.2)
Alkaline Phosphatase: 71 U/L (ref 39–117)
Bilirubin, Direct: 0.1 mg/dL (ref 0.0–0.3)
Total Bilirubin: 0.6 mg/dL (ref 0.2–1.2)
Total Protein: 6.5 g/dL (ref 6.0–8.3)

## 2021-03-12 LAB — BASIC METABOLIC PANEL
BUN: 12 mg/dL (ref 6–23)
CO2: 28 mEq/L (ref 19–32)
Calcium: 9.4 mg/dL (ref 8.4–10.5)
Chloride: 104 mEq/L (ref 96–112)
Creatinine, Ser: 0.84 mg/dL (ref 0.40–1.20)
GFR: 71.59 mL/min (ref >60.00)
Glucose, Bld: 109 mg/dL — ABNORMAL HIGH (ref 70–99)
Potassium: 4 mEq/L (ref 3.5–5.1)
Sodium: 141 mEq/L (ref 135–145)

## 2021-03-12 LAB — HEMOGLOBIN A1C: Hgb A1c MFr Bld: 6 % (ref 4.6–6.5)

## 2021-03-12 LAB — T3, FREE: T3, Free: 3.4 pg/mL (ref 2.3–4.2)

## 2021-03-12 LAB — T4, FREE: Free T4: 0.71 ng/dL (ref 0.60–1.60)

## 2021-03-12 LAB — TSH: TSH: 2 u[IU]/mL (ref 0.35–4.50)

## 2021-03-12 MED ORDER — POTASSIUM CHLORIDE CRYS ER 20 MEQ PO TBCR: 20.0000 meq | EXTENDED_RELEASE_TABLET | Freq: Every day | ORAL | 3 refills | Status: DC

## 2021-03-12 MED ORDER — OMEPRAZOLE 20 MG PO CPDR
1.0000 | DELAYED_RELEASE_CAPSULE | Freq: Every day | ORAL | 3 refills | Status: DC
Start: 2021-03-12 — End: 2022-01-02

## 2021-03-12 MED ORDER — TERBINAFINE HCL 250 MG PO TABS: 250.0000 mg | ORAL_TABLET | Freq: Every day | ORAL | 1 refills | Status: DC

## 2021-03-12 MED ORDER — LISINOPRIL-HYDROCHLOROTHIAZIDE 20-12.5 MG PO TABS
1.0000 | ORAL_TABLET | Freq: Every day | ORAL | 3 refills | Status: DC
Start: 2021-03-12 — End: 2022-01-02

## 2021-03-12 NOTE — Progress Notes (Signed)
   Subjective:    Patient ID: [name], female    DOB: 1951-01-06, 70 y.o.   MRN: [id number]  HPI Here for a well exam. She feels well. She sees Dr. Orvil Feil for asthma. She asks about a dark color on several of her toenails.    Review of Systems  Constitutional: Negative.   HENT: Negative.   Eyes: Negative.   Respiratory: Negative.   Cardiovascular: Negative.   Gastrointestinal: Negative.   Genitourinary: Negative for decreased urine volume, difficulty urinating, dyspareunia, dysuria, enuresis, flank pain, frequency, hematuria, pelvic pain and urgency.  Musculoskeletal: Negative.   Skin: Negative.   Neurological: Negative.   Psychiatric/Behavioral: Negative.        Objective:   Physical Exam Constitutional:      General: She is not in acute distress.    Appearance: She is well-developed. She is obese.  HENT:     Head: Normocephalic and atraumatic.     Right Ear: External ear normal.     Left Ear: External ear normal.     Nose: Nose normal.     Mouth/Throat:     Pharynx: No oropharyngeal exudate.  Eyes:     General: No scleral icterus.    Conjunctiva/sclera: Conjunctivae normal.     Pupils: Pupils are equal, round, and reactive to light.  Neck:     Thyroid: No thyromegaly.     Vascular: No JVD.  Cardiovascular:     Rate and Rhythm: Normal rate and regular rhythm.     Heart sounds: Normal heart sounds. No murmur heard. No friction rub. No gallop.   Pulmonary:     Effort: Pulmonary effort is normal. No respiratory distress.     Breath sounds: Normal breath sounds. No wheezing or rales.  Chest:     Chest wall: No tenderness.  Abdominal:     General: Bowel sounds are normal. There is no distension.     Palpations: Abdomen is soft. There is no mass.     Tenderness: There is no abdominal tenderness. There is no guarding or rebound.  Musculoskeletal:        General: No tenderness. Normal range of motion.     Cervical back: Normal range of motion and neck  supple.  Lymphadenopathy:     Cervical: No cervical adenopathy.  Skin:    General: Skin is warm and dry.     Findings: No erythema or rash.     Comments: All toenails show fungal involvement   Neurological:     Mental Status: She is alert and oriented to person, place, and time.     Cranial Nerves: No cranial nerve deficit.     Motor: No abnormal muscle tone.     Coordination: Coordination normal.     Deep Tendon Reflexes: Reflexes are normal and symmetric. Reflexes normal.  Psychiatric:        Behavior: Behavior normal.        Thought Content: Thought content normal.        Judgment: Judgment normal.           Assessment & Plan:  Well exam. We discussed diet and exercise. Get fasting labs. Treat the toenail fungus with terbinafine for 6 months. Set up another colonoscopy. Alysia Penna, MD

## 2021-03-17 ENCOUNTER — Other Ambulatory Visit: Payer: Self-pay

## 2021-03-17 ENCOUNTER — Telehealth: Payer: Self-pay | Admitting: Family Medicine

## 2021-03-17 DIAGNOSIS — J3089 Other allergic rhinitis: Secondary | ICD-10-CM | POA: Diagnosis not present

## 2021-03-17 MED ORDER — CEPHALEXIN 500 MG PO CAPS: ORAL_CAPSULE | ORAL | 0 refills | Status: DC

## 2021-03-17 NOTE — Telephone Encounter (Signed)
Pt is calling in stating that she is having ingrown hairs under her arms that is painful that appeared up on Saturday and would like to see if she can get some medication for it.  Pt is not sure of the name of the medication but stated that she has had it in the past.  [facility] [address]  Pt declined to make an appointment state that she was just in the office last week.

## 2021-03-17 NOTE — Telephone Encounter (Signed)
Pt Rx sent to her pharmacy, left detailed message for pt on her mobile phone

## 2021-03-17 NOTE — Telephone Encounter (Signed)
Call in Keflex 500 mg TID for 10 days

## 2021-03-24 DIAGNOSIS — J3089 Other allergic rhinitis: Secondary | ICD-10-CM | POA: Diagnosis not present

## 2021-03-31 DIAGNOSIS — J3089 Other allergic rhinitis: Secondary | ICD-10-CM | POA: Diagnosis not present

## 2021-04-08 DIAGNOSIS — J3089 Other allergic rhinitis: Secondary | ICD-10-CM | POA: Diagnosis not present

## 2021-04-11 ENCOUNTER — Telehealth: Payer: Self-pay | Admitting: Family Medicine

## 2021-04-11 MED ORDER — CEPHALEXIN 500 MG PO CAPS: ORAL_CAPSULE | ORAL | 0 refills | Status: DC

## 2021-04-11 NOTE — Telephone Encounter (Signed)
Last office visit- 03/12/21 Last refill- 03/17/21--30 cap, no refills  No future office visit scheduled

## 2021-04-11 NOTE — Telephone Encounter (Signed)
Pt call and stated she need a refill on cephALEXin (KEFLEX) 500 MG capsule sent to [facility] on [address]

## 2021-04-11 NOTE — Telephone Encounter (Signed)
Done

## 2021-04-14 NOTE — Telephone Encounter (Signed)
Spoke with pt state that she picked up Rx and is taking, pt state that she is feeling better

## 2021-04-15 DIAGNOSIS — J3089 Other allergic rhinitis: Secondary | ICD-10-CM | POA: Diagnosis not present

## 2021-04-21 DIAGNOSIS — J3089 Other allergic rhinitis: Secondary | ICD-10-CM | POA: Diagnosis not present

## 2021-04-28 ENCOUNTER — Telehealth (INDEPENDENT_AMBULATORY_CARE_PROVIDER_SITE_OTHER): Payer: Medicare HMO | Admitting: Family Medicine

## 2021-04-28 ENCOUNTER — Encounter: Payer: Self-pay | Admitting: Family Medicine

## 2021-04-28 VITALS — Wt 201.0 lb

## 2021-04-28 DIAGNOSIS — B370 Candidal stomatitis: Secondary | ICD-10-CM | POA: Diagnosis not present

## 2021-04-28 MED ORDER — NYSTATIN 100000 UNIT/ML MT SUSP: 5.0000 mL | Freq: Four times a day (QID) | OROMUCOSAL | 0 refills | Status: DC

## 2021-04-28 NOTE — Progress Notes (Signed)
Subjective:    Patient ID: [name], female    DOB: [birth year], 70 y.o.   MRN: [id number]  HPI Virtual Visit via Video Note  I connected with the patient on 04/28/21 at  3:45 PM EDT by a video enabled telemedicine application and verified that I am speaking with the correct person using two identifiers.  Location patient: home Location provider:work or home office Persons participating in the virtual visit: patient, provider  I discussed the limitations of evaluation and management by telemedicine and the availability of in person appointments. The patient expressed understanding and agreed to proceed.   HPI: Here for what she thinks is thrush. She has used Symbicort for several years and she tried to remember to wash her mouth out afterwards. Now for 3 days she has had redness and soreness of the tongue with "white spots" on the inner cheeks. She feels fine otherwise.    ROS: See pertinent positives and negatives per HPI.  Past Medical History:  Diagnosis Date   Allergy    Asthma    sees Dr. Baird Lyons   Colon polyp 2007   Depression    Diverticulosis    GERD (gastroesophageal reflux disease)    Gynecological examination    sees Dr. Paula Compton   Hyperlipemia    Hypertension     Past Surgical History:  Procedure Laterality Date   CARPAL TUNNEL RELEASE Right 1980   COLONOSCOPY  06-20-14   per Dr. Olevia Perches, adenomatous polyp,repeat in 5 yrs    DILATION AND CURETTAGE OF UTERUS  2005    Family History  Problem Relation Age of Onset   Colon cancer Mother 3   Asthma Mother    Colon cancer Father 59   Liver cancer Father    Throat cancer Brother    Colon cancer Maternal Aunt        not sure age of onset   Colon cancer Maternal Aunt        not sure age of onset   Diabetes Sister    Pancreatic cancer Neg Hx    Rectal cancer Neg Hx    Stomach cancer Neg Hx    Allergic rhinitis Neg Hx    Angioedema Neg Hx    Eczema Neg Hx      Current  Outpatient Medications:    albuterol (PROAIR HFA) 108 (90 Base) MCG/ACT inhaler, INHALE 2 PUFFS INTO THE LUNGS EVERY 6 HOURS AS NEEDED FOR WHEEZING., Disp: 3 Inhaler, Rfl: 3   aspirin 81 MG tablet, Take 81 mg by mouth daily., Disp: , Rfl:    budesonide-formoterol (SYMBICORT) 80-4.5 MCG/ACT inhaler, 2 puffs then rinse mouth, twice daily maintenance inhaler, Disp: 3 Inhaler, Rfl: 3   cephALEXin (KEFLEX) 500 MG capsule, Take 1 capsule by mouth 3 times daily x 10 days, Disp: 30 capsule, Rfl: 0   EPINEPHrine 0.3 mg/0.3 mL IJ SOAJ injection, Use as directed for severe allergic reaction, Disp: 2 Device, Rfl: 1   Flaxseed, Linseed, (FLAX SEED OIL) 1000 MG CAPS, Take 1 capsule by mouth daily., Disp: , Rfl:    fluticasone (FLONASE) 50 MCG/ACT nasal spray, One spray in each nostril twice daily, Disp: 48 g, Rfl: 3   ketoconazole (NIZORAL) 2 % cream, Apply 1 application topically daily., Disp: 30 g, Rfl: 2   lisinopril-hydrochlorothiazide (ZESTORETIC) 20-12.5 MG tablet, Take 1 tablet by mouth daily., Disp: 90 tablet, Rfl: 3   LORazepam (ATIVAN) 1 MG tablet, Take 1 tablet (1 mg total) by mouth every 8 (  eight) hours as needed for anxiety., Disp: 270 tablet, Rfl: 1   NON FORMULARY, Allergy vaccine. Once a week, Disp: , Rfl:    nystatin (MYCOSTATIN) 100000 UNIT/ML suspension, Take 5 mLs (500,000 Units total) by mouth 4 (four) times daily., Disp: 473 mL, Rfl: 0   omeprazole (PRILOSEC) 20 MG capsule, Take 1 capsule (20 mg total) by mouth daily., Disp: 90 capsule, Rfl: 3   potassium chloride SA (KLOR-CON) 20 MEQ tablet, Take 1 tablet (20 mEq total) by mouth daily., Disp: 90 tablet, Rfl: 3   pyridOXINE (VITAMIN B-6) 100 MG tablet, Take 100 mg by mouth daily., Disp: , Rfl:    terbinafine (LAMISIL) 250 MG tablet, Take 1 tablet (250 mg total) by mouth daily., Disp: 90 tablet, Rfl: 1   vitamin B-12 (CYANOCOBALAMIN) 100 MCG tablet, Take 100 mcg by mouth daily., Disp: , Rfl:   EXAM:  VITALS per patient if  applicable:  GENERAL: alert, oriented, appears well and in no acute distress  HEENT: atraumatic, conjunttiva clear, no obvious abnormalities on inspection of external nose and ears  NECK: normal movements of the head and neck  LUNGS: on inspection no signs of respiratory distress, breathing rate appears normal, no obvious gross SOB, gasping or wheezing  CV: no obvious cyanosis  MS: moves all visible extremities without noticeable abnormality  PSYCH/NEURO: pleasant and cooperative, no obvious depression or anxiety, speech and thought processing grossly intact  ASSESSMENT AND PLAN: Thrush, treat withgNystatin suspension QID.  Alysia Penna, MD  Discussed the following assessment and plan:  No diagnosis found.     I discussed the assessment and treatment plan with the patient. The patient was provided an opportunity to ask questions and all were answered. The patient agreed with the plan and demonstrated an understanding of the instructions.   The patient was advised to call back or seek an in-person evaluation if the symptoms worsen or if the condition fails to improve as anticipated.      Review of Systems     Objective:   Physical Exam        Assessment & Plan:

## 2021-04-29 DIAGNOSIS — J3089 Other allergic rhinitis: Secondary | ICD-10-CM | POA: Diagnosis not present

## 2021-05-01 DIAGNOSIS — J3089 Other allergic rhinitis: Secondary | ICD-10-CM | POA: Diagnosis not present

## 2021-05-06 DIAGNOSIS — J3089 Other allergic rhinitis: Secondary | ICD-10-CM | POA: Diagnosis not present

## 2021-05-14 DIAGNOSIS — J3089 Other allergic rhinitis: Secondary | ICD-10-CM | POA: Diagnosis not present

## 2021-05-15 ENCOUNTER — Other Ambulatory Visit: Payer: Self-pay | Admitting: Family Medicine

## 2021-05-15 ENCOUNTER — Encounter: Payer: Self-pay | Admitting: Gastroenterology

## 2021-05-16 NOTE — Telephone Encounter (Signed)
Last refill- 11/14/20 Last video visit- 04/28/21  No future office visit scheduled

## 2021-05-19 DIAGNOSIS — J3089 Other allergic rhinitis: Secondary | ICD-10-CM | POA: Diagnosis not present

## 2021-05-27 DIAGNOSIS — J3089 Other allergic rhinitis: Secondary | ICD-10-CM | POA: Diagnosis not present

## 2021-06-03 ENCOUNTER — Other Ambulatory Visit: Payer: Self-pay

## 2021-06-03 ENCOUNTER — Ambulatory Visit (AMBULATORY_SURGERY_CENTER): Payer: Medicare HMO

## 2021-06-03 VITALS — Ht 61.0 in | Wt 203.0 lb

## 2021-06-03 DIAGNOSIS — Z860100 Personal history of colon polyps, unspecified: Secondary | ICD-10-CM

## 2021-06-03 DIAGNOSIS — Z800 Family history of malignant neoplasm of digestive organs: Secondary | ICD-10-CM

## 2021-06-03 MED ORDER — NA SULFATE-K SULFATE-MG SULF 17.5-3.13-1.6 GM/177ML PO SOLN: 1.0000 | Freq: Once | ORAL | 0 refills | Status: AC

## 2021-06-03 NOTE — Progress Notes (Signed)
Patient's pre-visit was done today over the phone with the patient    Name,DOB and address verified.   Patient denies any allergies to Eggs and Soy. Patient denies any problems with anesthesia/sedation. Patient denies taking diet pills or blood thinners. No home Oxygen. Packet of Prep instructions mailed to patient including a copy of a consent form-pt is aware. Patient understands to call us back with any questions or concerns. Patient is aware of our care-partner policy and 0000000 safety protocol.

## 2021-06-04 DIAGNOSIS — J3089 Other allergic rhinitis: Secondary | ICD-10-CM | POA: Diagnosis not present

## 2021-06-10 ENCOUNTER — Telehealth: Payer: Self-pay | Admitting: Gastroenterology

## 2021-06-10 DIAGNOSIS — J3089 Other allergic rhinitis: Secondary | ICD-10-CM | POA: Diagnosis not present

## 2021-06-10 NOTE — Telephone Encounter (Signed)
Inbound call from patient. Have procedure 8/9. Had blood in stool the last 2 days. As of this morning 06/10/2021, there is no more blood. Patient asks for a call back to see if it okay to go through with procedure. Best contact number [phone]

## 2021-06-10 NOTE — Telephone Encounter (Signed)
Ok to proceed as scheduled - informed her needs to be addressed with colon 8-9- pt asked about hemorrhoid being addressed at colon- informed we cannot band or address hems at the colon- pt verbalized understanding

## 2021-06-17 ENCOUNTER — Other Ambulatory Visit: Payer: Self-pay

## 2021-06-17 ENCOUNTER — Ambulatory Visit (AMBULATORY_SURGERY_CENTER): Payer: Medicare HMO | Admitting: Gastroenterology

## 2021-06-17 ENCOUNTER — Encounter: Payer: Self-pay | Admitting: Gastroenterology

## 2021-06-17 VITALS — BP 118/63 | HR 70 | Temp 98.6°F | Resp 19 | Ht 61.0 in | Wt 203.0 lb

## 2021-06-17 DIAGNOSIS — D122 Benign neoplasm of ascending colon: Secondary | ICD-10-CM | POA: Diagnosis not present

## 2021-06-17 DIAGNOSIS — D124 Benign neoplasm of descending colon: Secondary | ICD-10-CM

## 2021-06-17 DIAGNOSIS — D120 Benign neoplasm of cecum: Secondary | ICD-10-CM

## 2021-06-17 DIAGNOSIS — D123 Benign neoplasm of transverse colon: Secondary | ICD-10-CM | POA: Diagnosis not present

## 2021-06-17 DIAGNOSIS — Z800 Family history of malignant neoplasm of digestive organs: Secondary | ICD-10-CM | POA: Diagnosis not present

## 2021-06-17 DIAGNOSIS — Z860100 Personal history of colon polyps, unspecified: Secondary | ICD-10-CM

## 2021-06-17 HISTORY — PX: COLONOSCOPY: SHX174

## 2021-06-17 MED ORDER — SODIUM CHLORIDE 0.9 % IV SOLN: 500.0000 mL | Freq: Once | INTRAVENOUS | Status: DC

## 2021-06-17 NOTE — Progress Notes (Signed)
C.W. vital signs. 

## 2021-06-17 NOTE — Progress Notes (Signed)
Pt's states no medical or surgical changes since previsit or office visit. 

## 2021-06-17 NOTE — Patient Instructions (Signed)
Handouts given for polyps, diverticulosis, hemorrhoids and high fiber diet.  Await pathology results.  Repeat colonoscopy in 3 years.  YOU HAD AN ENDOSCOPIC PROCEDURE TODAY AT [facility]:   Refer to the procedure report that was given to you for any specific questions about what was found during the examination.  If the procedure report does not answer your questions, please call your gastroenterologist to clarify.  If you requested that your care partner not be given the details of your procedure findings, then the procedure report has been included in a sealed envelope for you to review at your convenience later.  YOU SHOULD EXPECT: Some feelings of bloating in the abdomen. Passage of more gas than usual.  Walking can help get rid of the air that was put into your GI tract during the procedure and reduce the bloating. If you had a lower endoscopy (such as a colonoscopy or flexible sigmoidoscopy) you may notice spotting of blood in your stool or on the toilet paper. If you underwent a bowel prep for your procedure, you may not have a normal bowel movement for a few days.  Please Note:  You might notice some irritation and congestion in your nose or some drainage.  This is from the oxygen used during your procedure.  There is no need for concern and it should clear up in a day or so.  SYMPTOMS TO REPORT IMMEDIATELY:  Following lower endoscopy (colonoscopy or flexible sigmoidoscopy):  Excessive amounts of blood in the stool  Significant tenderness or worsening of abdominal pains  Swelling of the abdomen that is new, acute  Fever of 100F or higher  For urgent or emergent issues, a gastroenterologist can be reached at any hour by calling [phone]. Do not use MyChart messaging for urgent concerns.    DIET:  We do recommend a small meal at first, but then you may proceed to your regular diet.  Drink plenty of fluids but you should avoid alcoholic beverages for 24  hours.  ACTIVITY:  You should plan to take it easy for the rest of today and you should NOT DRIVE or use heavy machinery until tomorrow (because of the sedation medicines used during the test).    FOLLOW UP: Our staff will call the number listed on your records 48-72 hours following your procedure to check on you and address any questions or concerns that you may have regarding the information given to you following your procedure. If we do not reach you, we will leave a message.  We will attempt to reach you two times.  During this call, we will ask if you have developed any symptoms of COVID 19. If you develop any symptoms (ie: fever, flu-like symptoms, shortness of breath, cough etc.) before then, please call [phone].  If you test positive for Covid 19 in the 2 weeks post procedure, please call and report this information to [place].    If any biopsies were taken you will be contacted by phone or by letter within the next 1-3 weeks.  Please call us at [phone] if you have not heard about the biopsies in 3 weeks.    SIGNATURES/CONFIDENTIALITY: You and/or your care partner have signed paperwork which will be entered into your electronic medical record.  These signatures attest to the fact that that the information above on your After Visit Summary has been reviewed and is understood.  Full responsibility of the confidentiality of this discharge information lies with you and/or your  care-partner.  

## 2021-06-17 NOTE — Progress Notes (Signed)
Called to room to assist during endoscopic procedure.  Patient ID and intended procedure confirmed with present staff. Received instructions for my participation in the procedure from the performing physician.  

## 2021-06-17 NOTE — Progress Notes (Signed)
pt tolerated well. VSS. awake and to recovery. Report given to RN.  

## 2021-06-17 NOTE — Op Note (Signed)
[facility] Patient Name: [name] Procedure Date: 06/17/2021 8:53 AM MRN: [id number] Endoscopist: Thornton Park MD, MD Age: 70 Referring MD:  Date of Birth: 1951-10-28 Gender: Female Account #: [id number] Procedure:                Colonoscopy Indications:              Surveillance: Personal history of adenomatous                            polyps on last colonoscopy 5 years ago, Family                            history of colon cancer in multiple first-degree                            relatives                           Prior normal colonoscopies in 1999, 2007, and 2010                           Tubular adenoma on colonoscopy 2015                           Mother with colon cancer at age 59                           Father with colon cancer at age 63                           Maternal aunts with colon cancer Medicines:                Monitored Anesthesia Care Procedure:                Pre-Anesthesia Assessment:                           - Prior to the procedure, a History and Physical                            was performed, and patient medications and                            allergies were reviewed. The patient's tolerance of                            previous anesthesia was also reviewed. The risks                            and benefits of the procedure and the sedation                            options and risks were discussed with the patient.                            All questions  were answered, and informed consent                            was obtained. Prior Anticoagulants: The patient has                            taken no previous anticoagulant or antiplatelet                            agents. ASA Grade Assessment: II - A patient with                            mild systemic disease. After reviewing the risks                            and benefits, the patient was deemed in                            satisfactory condition to undergo the  procedure.                           After obtaining informed consent, the colonoscope                            was passed under direct vision. Throughout the                            procedure, the patient's blood pressure, pulse, and                            oxygen saturations were monitored continuously. The                            CF [id number] was introduced through the anus                            and advanced to the the cecum, identified by                            appendiceal orifice and ileocecal valve. The                            colonoscopy was technically difficult and complex                            due to a redundant colon and significant looping.                            Successful completion of the procedure was aided by                            applying abdominal pressure. The patient tolerated  the procedure well. The quality of the bowel                            preparation was good. The terminal ileum, ileocecal                            valve, appendiceal orifice, and rectum were                            photographed. Scope In: 8:59:45 AM Scope Out: 9:19:58 AM Scope Withdrawal Time: 0 hours 15 minutes 9 seconds  Total Procedure Duration: 0 hours 20 minutes 13 seconds  Findings:                 Non-bleeding external and internal hemorrhoids were                            found.                           Many small and large-mouthed diverticula were found                            in the sigmoid colon, descending colon and                            ascending colon.                           A 3 mm polyp was found in the descending colon. The                            polyp was sessile. The polyp was removed with a                            cold snare. Resection and retrieval were complete.                            Estimated blood loss was minimal.                           A 4 mm polyp was found in the  transverse colon. The                            polyp was sessile. The polyp was removed with a                            cold snare. Resection and retrieval were complete.                            Estimated blood loss was minimal.                           A 2 mm polyp was found in the proximal ascending  colon. The polyp was sessile. The polyp was removed                            with a cold snare. Resection and retrieval were                            complete. Estimated blood loss was minimal.                           A 2 mm polyp was found in the cecum. The polyp was                            sessile. The polyp was removed with a cold snare.                            Resection and retrieval were complete. Estimated                            blood loss was minimal.                           The exam was otherwise without abnormality on                            direct and retroflexion views. Complications:            No immediate complications. Estimated blood loss:                            Minimal. Estimated Blood Loss:     Estimated blood loss was minimal. Impression:               - Non-bleeding external and internal hemorrhoids.                           - Diverticulosis in the sigmoid colon, in the                            descending colon and in the ascending colon.                           - One 3 mm polyp in the descending colon, removed                            with a cold snare. Resected and retrieved.                           - One 4 mm polyp in the transverse colon, removed                            with a cold snare. Resected and retrieved.                           - One 2 mm polyp in the proximal  ascending colon,                            removed with a cold snare. Resected and retrieved.                           - One 2 mm polyp in the cecum, removed with a cold                            snare. Resected and retrieved.                            - The examination was otherwise normal on direct                            and retroflexion views. Recommendation:           - Patient has a contact number available for                            emergencies. The signs and symptoms of potential                            delayed complications were discussed with the                            patient. Return to normal activities tomorrow.                            Written discharge instructions were provided to the                            patient.                           - High fiber diet.                           - Continue present medications.                           - Await pathology results.                           - Repeat colonoscopy in 3 years for surveillance.                           - Emerging evidence supports eating a diet of                            fruits, vegetables, grains, calcium, and yogurt                            while reducing red meat and alcohol may reduce the  risk of colon cancer.                           - Given these results, all first degree relatives                            (brothers, sisters, children, parents) should start                            colon cancer screening at age 67.                           - Thank you for allowing me to be involved in your                            colon cancer prevention. Thornton Park MD, MD 06/17/2021 9:25:51 AM This report has been signed electronically.

## 2021-06-18 DIAGNOSIS — J3089 Other allergic rhinitis: Secondary | ICD-10-CM | POA: Diagnosis not present

## 2021-06-19 ENCOUNTER — Other Ambulatory Visit: Payer: Self-pay | Admitting: Family Medicine

## 2021-06-19 ENCOUNTER — Telehealth: Payer: Self-pay

## 2021-06-19 ENCOUNTER — Telehealth: Payer: Self-pay | Admitting: *Deleted

## 2021-06-19 NOTE — Telephone Encounter (Signed)
Attempted to reach pt. With follow-up call following endoscopic procedure 06/17/2021.  LM On pt. Voice mail.  Will try to reach pt. Again later today.

## 2021-06-19 NOTE — Telephone Encounter (Signed)
  Follow up Call-  Call back number 06/17/2021  Post procedure Call Back phone  # [phone]  Permission to leave phone message Yes  Some recent data might be hidden     Patient questions:  Do you have a fever, pain , or abdominal swelling? No. Pain Score  0 *  Have you tolerated food without any problems? Yes.    Have you been able to return to your normal activities? Yes.    Do you have any questions about your discharge instructions: Diet   No. Medications  No. Follow up visit  No.  Do you have questions or concerns about your Care? No.  Actions: * If pain score is 4 or above: No action needed, pain <4.  Have you developed a fever since your procedure? no  2.   Have you had an respiratory symptoms (SOB or cough) since your procedure? no  3.   Have you tested positive for COVID 19 since your procedure no  4.   Have you had any family members/close contacts diagnosed with the COVID 19 since your procedure?  no   If yes to any of these questions please route to Joylene John, RN and Joella Prince, RN

## 2021-06-20 DIAGNOSIS — J3089 Other allergic rhinitis: Secondary | ICD-10-CM | POA: Diagnosis not present

## 2021-06-20 NOTE — Telephone Encounter (Signed)
PT needs a refill of their cephALEXin (KEFLEX) 500 MG capsule [facility]

## 2021-06-23 DIAGNOSIS — J3089 Other allergic rhinitis: Secondary | ICD-10-CM | POA: Diagnosis not present

## 2021-06-25 DIAGNOSIS — J3089 Other allergic rhinitis: Secondary | ICD-10-CM | POA: Diagnosis not present

## 2021-06-27 ENCOUNTER — Encounter: Payer: Self-pay | Admitting: Gastroenterology

## 2021-07-01 DIAGNOSIS — J3089 Other allergic rhinitis: Secondary | ICD-10-CM | POA: Diagnosis not present

## 2021-07-07 DIAGNOSIS — J3089 Other allergic rhinitis: Secondary | ICD-10-CM | POA: Diagnosis not present

## 2021-07-15 DIAGNOSIS — J3089 Other allergic rhinitis: Secondary | ICD-10-CM | POA: Diagnosis not present

## 2021-07-29 DIAGNOSIS — J3089 Other allergic rhinitis: Secondary | ICD-10-CM | POA: Diagnosis not present

## 2021-08-01 ENCOUNTER — Telehealth: Payer: Self-pay | Admitting: Family Medicine

## 2021-08-01 ENCOUNTER — Other Ambulatory Visit: Payer: Self-pay | Admitting: Family Medicine

## 2021-08-01 ENCOUNTER — Other Ambulatory Visit: Payer: Self-pay

## 2021-08-01 MED ORDER — CEPHALEXIN 500 MG PO CAPS: ORAL_CAPSULE | ORAL | 1 refills | Status: DC

## 2021-08-01 NOTE — Telephone Encounter (Signed)
Patient called to get refill on cephALEXin (KEFLEX) 500 MG capsule     Please Send to  [id number] [place], [place] Phone:  [phone]  Fax:  [phone]      Good callback number is [phone]    Please Advise

## 2021-08-01 NOTE — Telephone Encounter (Signed)
[facility] [address].  Lvm informing patient prescription has been sent to pharmacy

## 2021-08-01 NOTE — Telephone Encounter (Signed)
Spoke with pt state that she felt  3 lymph nodes under her arm, pt states that Dr Sarajane Jews treated her for the same condition previously and was  prescribed Keflex 500 mg which cleared them. Please advise

## 2021-08-01 NOTE — Telephone Encounter (Signed)
I need to know what this if for

## 2021-08-01 NOTE — Telephone Encounter (Signed)
Noted. Please call in Keflex 500 mg TID for 10 days, #30 with one rf

## 2021-08-01 NOTE — Telephone Encounter (Signed)
Last refill- 06/20/21--30 tabs, no refill

## 2021-08-04 NOTE — Telephone Encounter (Signed)
Last video visit- 04/28/21 Next O.V physical- 09/10/21  Can this patient receive a refill

## 2021-08-05 DIAGNOSIS — J3089 Other allergic rhinitis: Secondary | ICD-10-CM | POA: Diagnosis not present

## 2021-08-12 DIAGNOSIS — J3089 Other allergic rhinitis: Secondary | ICD-10-CM | POA: Diagnosis not present

## 2021-08-16 DIAGNOSIS — Z9189 Other specified personal risk factors, not elsewhere classified: Secondary | ICD-10-CM | POA: Diagnosis not present

## 2021-08-16 DIAGNOSIS — Z1152 Encounter for screening for COVID-19: Secondary | ICD-10-CM | POA: Diagnosis not present

## 2021-08-19 DIAGNOSIS — J3089 Other allergic rhinitis: Secondary | ICD-10-CM | POA: Diagnosis not present

## 2021-08-19 DIAGNOSIS — Z23 Encounter for immunization: Secondary | ICD-10-CM | POA: Diagnosis not present

## 2021-08-22 ENCOUNTER — Telehealth: Payer: Medicare HMO | Admitting: Family Medicine

## 2021-08-22 ENCOUNTER — Encounter: Payer: Self-pay | Admitting: Family Medicine

## 2021-08-22 ENCOUNTER — Telehealth (INDEPENDENT_AMBULATORY_CARE_PROVIDER_SITE_OTHER): Payer: Medicare HMO | Admitting: Family Medicine

## 2021-08-22 DIAGNOSIS — J019 Acute sinusitis, unspecified: Secondary | ICD-10-CM

## 2021-08-22 MED ORDER — HYDROCODONE BIT-HOMATROP MBR 5-1.5 MG/5ML PO SOLN: 5.0000 mL | ORAL | 0 refills | Status: DC | PRN

## 2021-08-22 MED ORDER — AZITHROMYCIN 250 MG PO TABS: ORAL_TABLET | ORAL | 0 refills | Status: DC

## 2021-08-22 NOTE — Progress Notes (Signed)
   Subjective:    Patient ID: [name], female    DOB: 1951-10-13, 70 y.o.   MRN: [id number]  HPI Virtual Visit via Telephone Note  I connected with the patient on 08/22/21 at  8:30 AM EDT by telephone and verified that I am speaking with the correct person using two identifiers.   I discussed the limitations, risks, security and privacy concerns of performing an evaluation and management service by telephone and the availability of in person appointments. I also discussed with the patient that there may be a patient responsible charge related to this service. The patient expressed understanding and agreed to proceed.  Location patient: home Location provider: work or home office Participants present for the call: patient, provider Patient did not have a visit in the prior 7 days to address this/these issue(s).   History of Present Illness: Here for 10 days of sinus congestion, PND, a ST, and a dry cough. No fever or body aches or SOB. No NVD. She went to an urgent care 6 days ago and she tested negative for the Covid-19 virus.   Observations/Objective: Patient sounds cheerful and well on the phone. I do not appreciate any SOB. Speech and thought processing are grossly intact. Patient reported vitals:  Assessment and Plan: Sinusitis, treat with a Zpack. Alysia Penna, MD   Follow Up Instructions:     [phone] 5-10 [phone] 11-20 [phone] 21-30 I did not refer this patient for an OV in the next 24 hours for this/these issue(s).  I discussed the assessment and treatment plan with the patient. The patient was provided an opportunity to ask questions and all were answered. The patient agreed with the plan and demonstrated an understanding of the instructions.   The patient was advised to call back or seek an in-person evaluation if the symptoms worsen or if the condition fails to improve as anticipated.  I provided 12 minutes of non-face-to-face time during this  encounter.   Alysia Penna, MD     Review of Systems     Objective:   Physical Exam        Assessment & Plan:

## 2021-08-26 DIAGNOSIS — J3089 Other allergic rhinitis: Secondary | ICD-10-CM | POA: Diagnosis not present

## 2021-09-02 ENCOUNTER — Other Ambulatory Visit: Payer: Self-pay

## 2021-09-02 ENCOUNTER — Ambulatory Visit (INDEPENDENT_AMBULATORY_CARE_PROVIDER_SITE_OTHER): Payer: Medicare HMO | Admitting: Family Medicine

## 2021-09-02 ENCOUNTER — Encounter: Payer: Self-pay | Admitting: Family Medicine

## 2021-09-02 VITALS — BP 132/68 | HR 72 | Temp 98.8°F | Wt 196.0 lb

## 2021-09-02 DIAGNOSIS — J019 Acute sinusitis, unspecified: Secondary | ICD-10-CM | POA: Diagnosis not present

## 2021-09-02 MED ORDER — METHYLPREDNISOLONE ACETATE 80 MG/ML IJ SUSP
80.0000 mg | Freq: Once | INTRAMUSCULAR | Status: AC
  Administered 2021-09-02: 80 mg via INTRAMUSCULAR

## 2021-09-02 MED ORDER — METHYLPREDNISOLONE ACETATE 40 MG/ML IJ SUSP
40.0000 mg | Freq: Once | INTRAMUSCULAR | Status: AC
  Administered 2021-09-02: 40 mg via INTRAMUSCULAR

## 2021-09-02 MED ORDER — LEVOFLOXACIN 500 MG PO TABS: 500.0000 mg | ORAL_TABLET | Freq: Every day | ORAL | 0 refills | Status: AC

## 2021-09-02 NOTE — Progress Notes (Signed)
    Subjective:    Patient ID: [name], female    DOB: Nov 15, 1950, 70 y.o.   MRN: [id number]  HPI Here for a partially treated sinusitis. We saw her on 08-22-21 for this and gave her a Zpack. This seemed to help for a few days but the symptoms returned. She has had 3 weeks of sinus congestion, ear pain. PND, ST, and dry cough. No fever. Taking Mucinex.    Review of Systems  Constitutional: Negative.   HENT:  Positive for congestion, ear pain, hearing loss, postnasal drip and sore throat.   Eyes: Negative.   Respiratory:  Positive for cough. Negative for shortness of breath and wheezing.   Gastrointestinal: Negative.       Objective:   Physical Exam Constitutional:      Appearance: Normal appearance. She is not ill-appearing.  HENT:     Right Ear: Tympanic membrane, ear canal and external ear normal.     Left Ear: Tympanic membrane, ear canal and external ear normal.     Nose: Nose normal.     Mouth/Throat:     Pharynx: Oropharynx is clear.  Eyes:     Conjunctiva/sclera: Conjunctivae normal.  Pulmonary:     Effort: Pulmonary effort is normal.     Breath sounds: Normal breath sounds.  Lymphadenopathy:     Cervical: No cervical adenopathy.  Neurological:     Mental Status: She is alert.          Assessment & Plan:  Sinusitis, given a shot of DepoMedrol and she will start 10 days of Levaquin. Alysia Penna, MD

## 2021-09-02 NOTE — Addendum Note (Signed)
Addended by: Wyvonne Lenz on: 09/02/2021 01:54 PM   Modules accepted: Orders

## 2021-09-04 ENCOUNTER — Ambulatory Visit (INDEPENDENT_AMBULATORY_CARE_PROVIDER_SITE_OTHER): Payer: Medicare HMO

## 2021-09-04 DIAGNOSIS — Z0000 Encounter for general adult medical examination without abnormal findings: Secondary | ICD-10-CM | POA: Diagnosis not present

## 2021-09-04 DIAGNOSIS — Z1231 Encounter for screening mammogram for malignant neoplasm of breast: Secondary | ICD-10-CM | POA: Diagnosis not present

## 2021-09-04 NOTE — Patient Instructions (Signed)
[name] , Thank you for taking time to come for your Medicare Wellness Visit. I appreciate your ongoing commitment to your health goals. Please review the following plan we discussed and let me know if I can assist you in the future.   Screening recommendations/referrals: Colonoscopy: 06/17/2021  due 2027 Mammogram: ordered 09/04/2021 Bone Density: 06/09/2012 Recommended yearly ophthalmology/optometry visit for glaucoma screening and checkup Recommended yearly dental visit for hygiene and checkup  Vaccinations: Influenza vaccine: completed  Pneumococcal vaccine: completed series  Tdap vaccine: 05/21/2013 Shingles vaccine: will consider     Advanced directives: will provide copies   Conditions/risks identified: none   Next appointment: none    Preventive Care 68 Years and Older, Female Preventive care refers to lifestyle choices and visits with your health care provider that can promote health and wellness. What does preventive care include? A yearly physical exam. This is also called an annual well check. Dental exams once or twice a year. Routine eye exams. Ask your health care provider how often you should have your eyes checked. Personal lifestyle choices, including: Daily care of your teeth and gums. Regular physical activity. Eating a healthy diet. Avoiding tobacco and drug use. Limiting alcohol use. Practicing safe sex. Taking low-dose aspirin every day. Taking vitamin and mineral supplements as recommended by your health care provider. What happens during an annual well check? The services and screenings done by your health care provider during your annual well check will depend on your age, overall health, lifestyle risk factors, and family history of disease. Counseling  Your health care provider may ask you questions about your: Alcohol use. Tobacco use. Drug use. Emotional well-being. Home and relationship well-being. Sexual activity. Eating  habits. History of falls. Memory and ability to understand (cognition). Work and work Statistician. Reproductive health. Screening  You may have the following tests or measurements: Height, weight, and BMI. Blood pressure. Lipid and cholesterol levels. These may be checked every 5 years, or more frequently if you are over 35 years old. Skin check. Lung cancer screening. You may have this screening every year starting at age 76 if you have a 30-pack-year history of smoking and currently smoke or have quit within the past 15 years. Fecal occult blood test (FOBT) of the stool. You may have this test every year starting at age 57. Flexible sigmoidoscopy or colonoscopy. You may have a sigmoidoscopy every 5 years or a colonoscopy every 10 years starting at age 58. Hepatitis C blood test. Hepatitis B blood test. Sexually transmitted disease (STD) testing. Diabetes screening. This is done by checking your blood sugar (glucose) after you have not eaten for a while (fasting). You may have this done every 1-3 years. Bone density scan. This is done to screen for osteoporosis. You may have this done starting at age 72. Mammogram. This may be done every 1-2 years. Talk to your health care provider about how often you should have regular mammograms. Talk with your health care provider about your test results, treatment options, and if necessary, the need for more tests. Vaccines  Your health care provider may recommend certain vaccines, such as: Influenza vaccine. This is recommended every year. Tetanus, diphtheria, and acellular pertussis (Tdap, Td) vaccine. You may need a Td booster every 10 years. Zoster vaccine. You may need this after age 61. Pneumococcal 13-valent conjugate (PCV13) vaccine. One dose is recommended after age 68. Pneumococcal polysaccharide (PPSV23) vaccine. One dose is recommended after age 39. Talk to your health care provider about which screenings  and vaccines you need and how  often you need them. This information is not intended to replace advice given to you by your health care provider. Make sure you discuss any questions you have with your health care provider. Document Released: 11/22/2015 Document Revised: 07/15/2016 Document Reviewed: 08/27/2015 Elsevier Interactive Patient Education  2017 [place] Prevention in the Home Falls can cause injuries. They can happen to people of all ages. There are many things you can do to make your home safe and to help prevent falls. What can I do on the outside of my home? Regularly fix the edges of walkways and driveways and fix any cracks. Remove anything that might make you trip as you walk through a door, such as a raised step or threshold. Trim any bushes or trees on the path to your home. Use bright outdoor lighting. Clear any walking paths of anything that might make someone trip, such as rocks or tools. Regularly check to see if handrails are loose or broken. Make sure that both sides of any steps have handrails. Any raised decks and porches should have guardrails on the edges. Have any leaves, snow, or ice cleared regularly. Use sand or salt on walking paths during winter. Clean up any spills in your garage right away. This includes oil or grease spills. What can I do in the bathroom? Use night lights. Install grab bars by the toilet and in the tub and shower. Do not use towel bars as grab bars. Use non-skid mats or decals in the tub or shower. If you need to sit down in the shower, use a plastic, non-slip stool. Keep the floor dry. Clean up any water that spills on the floor as soon as it happens. Remove soap buildup in the tub or shower regularly. Attach bath mats securely with double-sided non-slip rug tape. Do not have throw rugs and other things on the floor that can make you trip. What can I do in the bedroom? Use night lights. Make sure that you have a light by your bed that is easy to  reach. Do not use any sheets or blankets that are too big for your bed. They should not hang down onto the floor. Have a firm chair that has side arms. You can use this for support while you get dressed. Do not have throw rugs and other things on the floor that can make you trip. What can I do in the kitchen? Clean up any spills right away. Avoid walking on wet floors. Keep items that you use a lot in easy-to-reach places. If you need to reach something above you, use a strong step stool that has a grab bar. Keep electrical cords out of the way. Do not use floor polish or wax that makes floors slippery. If you must use wax, use non-skid floor wax. Do not have throw rugs and other things on the floor that can make you trip. What can I do with my stairs? Do not leave any items on the stairs. Make sure that there are handrails on both sides of the stairs and use them. Fix handrails that are broken or loose. Make sure that handrails are as long as the stairways. Check any carpeting to make sure that it is firmly attached to the stairs. Fix any carpet that is loose or worn. Avoid having throw rugs at the top or bottom of the stairs. If you do have throw rugs, attach them to the floor with carpet tape.  Make sure that you have a light switch at the top of the stairs and the bottom of the stairs. If you do not have them, ask someone to add them for you. What else can I do to help prevent falls? Wear shoes that: Do not have high heels. Have rubber bottoms. Are comfortable and fit you well. Are closed at the toe. Do not wear sandals. If you use a stepladder: Make sure that it is fully opened. Do not climb a closed stepladder. Make sure that both sides of the stepladder are locked into place. Ask someone to hold it for you, if possible. Clearly mark and make sure that you can see: Any grab bars or handrails. First and last steps. Where the edge of each step is. Use tools that help you move  around (mobility aids) if they are needed. These include: Canes. Walkers. Scooters. Crutches. Turn on the lights when you go into a dark area. Replace any light bulbs as soon as they burn out. Set up your furniture so you have a clear path. Avoid moving your furniture around. If any of your floors are uneven, fix them. If there are any pets around you, be aware of where they are. Review your medicines with your doctor. Some medicines can make you feel dizzy. This can increase your chance of falling. Ask your doctor what other things that you can do to help prevent falls. This information is not intended to replace advice given to you by your health care provider. Make sure you discuss any questions you have with your health care provider. Document Released: 08/22/2009 Document Revised: 04/02/2016 Document Reviewed: 11/30/2014 Elsevier Interactive Patient Education  2017 [employer].

## 2021-09-04 NOTE — Progress Notes (Signed)
Subjective:   [name] is a 70 y.o. female who presents for Medicare Annual (Subsequent) preventive examination.  I connected with Maxene Byington today by telephone and verified that I am speaking with the correct person using two identifiers. Location patient: home Location provider: work Persons participating in the virtual visit: patient, provider.   I discussed the limitations, risks, security and privacy concerns of performing an evaluation and management service by telephone and the availability of in person appointments. I also discussed with the patient that there may be a patient responsible charge related to this service. The patient expressed understanding and verbally consented to this telephonic visit.    Interactive audio and video telecommunications were attempted between this provider and patient, however failed, due to patient having technical difficulties OR patient did not have access to video capability.  We continued and completed visit with audio only.    Review of Systems     Cardiac Risk Factors include: advanced age (>37men, >48 women);dyslipidemia;hypertension     Objective:    Today's Vitals   There is no height or weight on file to calculate BMI.  Advanced Directives 09/04/2021 09/03/2020 04/30/2016 06/06/2014  Does Patient Have a Medical Advance Directive? No Yes;No No Patient does not have advance directive  Does patient want to make changes to medical advance directive? - No - Patient declined - -  Would patient like information on creating a medical advance directive? No - Patient declined No - Patient declined No - patient declined information -    Current Medications (verified) Outpatient Encounter Medications as of 09/04/2021  Medication Sig   albuterol (PROAIR HFA) 108 (90 Base) MCG/ACT inhaler INHALE 2 PUFFS INTO THE LUNGS EVERY 6 HOURS AS NEEDED FOR WHEEZING.   aspirin 81 MG tablet Take 81 mg by mouth daily.   budesonide-formoterol  (SYMBICORT) 80-4.5 MCG/ACT inhaler 2 puffs then rinse mouth, twice daily maintenance inhaler   EPINEPHrine 0.3 mg/0.3 mL IJ SOAJ injection Use as directed for severe allergic reaction   Flaxseed, Linseed, (FLAX SEED OIL) 1000 MG CAPS Take 1 capsule by mouth daily.   fluticasone (FLONASE) 50 MCG/ACT nasal spray One spray in each nostril twice daily   HYDROcodone bit-homatropine (HYCODAN) 5-1.5 MG/5ML syrup Take 5 mLs by mouth every 4 (four) hours as needed for cough.   ketoconazole (NIZORAL) 2 % cream Apply 1 application topically daily.   levofloxacin (LEVAQUIN) 500 MG tablet Take 1 tablet (500 mg total) by mouth daily for 10 days.   lisinopril-hydrochlorothiazide (ZESTORETIC) 20-12.5 MG tablet Take 1 tablet by mouth daily.   LORazepam (ATIVAN) 1 MG tablet TAKE 1 TABLET EVERY 8 HOURS AS NEEDED FOR ANXIETY   NON FORMULARY Allergy vaccine. Once a week   nystatin (MYCOSTATIN) 100000 UNIT/ML suspension TAKE 5 MLS (500,000 UNITS TOTAL) BY MOUTH 4 (FOUR) TIMES DAILY.   omeprazole (PRILOSEC) 20 MG capsule Take 1 capsule (20 mg total) by mouth daily.   potassium chloride SA (KLOR-CON) 20 MEQ tablet Take 1 tablet (20 mEq total) by mouth daily.   pyridOXINE (VITAMIN B-6) 100 MG tablet Take 100 mg by mouth daily.   terbinafine (LAMISIL) 250 MG tablet TAKE 1 TABLET BY MOUTH EVERY DAY   vitamin B-12 (CYANOCOBALAMIN) 100 MCG tablet Take 100 mcg by mouth daily.   No facility-administered encounter medications on file as of 09/04/2021.    Allergies (verified) Doxycycline   History: Past Medical History:  Diagnosis Date   Allergy    Asthma    sees Dr.  Clinton Young   Colon polyp 2007   Depression    Diverticulosis    GERD (gastroesophageal reflux disease)    Gynecological examination    sees Dr. Paula Compton   Hyperlipemia    Hypertension    Past Surgical History:  Procedure Laterality Date   CARPAL TUNNEL RELEASE Right 11/09/1978   COLONOSCOPY  06/20/2014   per Dr. Olevia Perches, adenomatous  polyp,repeat in 5 yrs    DILATION AND CURETTAGE OF UTERUS  11/10/2003   KNEE SURGERY     Family History  Problem Relation Age of Onset   Colon cancer Mother 71   Asthma Mother    Colon cancer Father 43   Liver cancer Father    Diabetes Sister    Throat cancer Brother    Colon cancer Maternal Aunt        not sure age of onset   Colon cancer Maternal Aunt        not sure age of onset   Pancreatic cancer Neg Hx    Rectal cancer Neg Hx    Stomach cancer Neg Hx    Allergic rhinitis Neg Hx    Angioedema Neg Hx    Eczema Neg Hx    Colon polyps Neg Hx    Esophageal cancer Neg Hx    Social History   Socioeconomic History   Marital status: Married    Spouse name: Not on file   Number of children: 2   Years of education: Not on file   Highest education level: Not on file  Occupational History    Employer: [employer]  Tobacco Use   Smoking status: Never   Smokeless tobacco: Never  Vaping Use   Vaping Use: Never used  Substance and Sexual Activity   Alcohol use: Yes    Alcohol/week: 0.0 standard drinks    Comment: occ   Drug use: No   Sexual activity: Not on file  Other Topics Concern   Not on file  Social History Narrative   Daily caffeine    Social Determinants of Health   Financial Resource Strain: Low Risk    Difficulty of Paying Living Expenses: Not hard at all  Food Insecurity: No Food Insecurity   Worried About Charity fundraiser in the Last Year: Never true   [place] in the Last Year: Never true  Transportation Needs: No Transportation Needs   Lack of Transportation (Medical): No   Lack of Transportation (Non-Medical): No  Physical Activity: Inactive   Days of Exercise per Week: 0 days   Minutes of Exercise per Session: 0 min  Stress: No Stress Concern Present   Feeling of Stress : Not at all  Social Connections: Moderately Integrated   Frequency of Communication with Friends and Family: Twice a week   Frequency of Social Gatherings with  Friends and Family: Twice a week   Attends Religious Services: More than 4 times per year   Active Member of [employer] or Organizations: No   Attends Music therapist: Never   Marital Status: Married    Tobacco Counseling Counseling given: Not Answered   Clinical Intake:  Pre-visit preparation completed: Yes  Pain : No/denies pain     Nutritional Risks: None Diabetes: No  How often do you need to have someone help you when you read instructions, pamphlets, or other written materials from your doctor or pharmacy?: 1 - Never What is the last grade level you completed in school?: High school  Diabetic?no  Interpreter Needed?: No  Information entered by :: [place] of Daily Living In your present state of health, do you have any difficulty performing the following activities: 09/04/2021  Hearing? N  Vision? N  Difficulty concentrating or making decisions? N  Walking or climbing stairs? N  Dressing or bathing? N  Doing errands, shopping? N  Preparing Food and eating ? N  Using the Toilet? N  In the past six months, have you accidently leaked urine? N  Do you have problems with loss of bowel control? N  Managing your Medications? N  Managing your Finances? N  Housekeeping or managing your Housekeeping? N  Some recent data might be hidden    Patient Care Team: Laurey Morale, MD as PCP - General  Indicate any recent Medical Services you may have received from other than [facility] providers in the past year (date may be approximate).     Assessment:   This is a routine wellness examination for [name].  Hearing/Vision screen Vision Screening - Comments:: Annual eye exams   Dietary issues and exercise activities discussed: Current Exercise Habits: The patient does not participate in regular exercise at present   Goals Addressed   None    Depression Screen PHQ 2/9 Scores 09/04/2021 09/04/2021 03/12/2021 09/03/2020 11/05/2015 10/06/2015   PHQ - 2 Score 0 0 2 0 0 0  PHQ- 9 Score - - 8 0 - -    Fall Risk Fall Risk  09/04/2021 09/04/2021 03/12/2021 09/03/2020 03/21/2018  Falls in the past year? 0 0 0 0 No  Number falls in past yr: 0 - 0 0 -  Injury with Fall? 0 - 0 0 -  Risk for fall due to : No Fall Risks - No Fall Risks No Fall Risks -  Follow up Falls evaluation completed - - Falls evaluation completed;Falls prevention discussed -    FALL RISK PREVENTION PERTAINING TO THE HOME:  Any stairs in or around the home? No  If so, are there any without handrails? No  Home free of loose throw rugs in walkways, pet beds, electrical cords, etc? Yes  Adequate lighting in your home to reduce risk of falls? Yes   ASSISTIVE DEVICES UTILIZED TO PREVENT FALLS:  Life alert? No  Use of a cane, walker or w/c? No  Grab bars in the bathroom? No  Shower chair or bench in shower? Yes  Elevated toilet seat or a handicapped toilet? No   Cognitive Function:  Normal cognitive status assessed by direct observation by this Nurse Health Advisor. No abnormalities found.     6CIT Screen 09/03/2020  What Year? 0 points  What month? 0 points  What time? 0 points  Count back from 20 0 points  Months in reverse 0 points  Repeat phrase 2 points  Total Score 2    Immunizations Immunization History  Administered Date(s) Administered   Fluad Quad(high Dose 65+) 08/24/2019, 08/26/2020   Influenza Split 08/10/2011, 08/09/2012, 08/02/2013   Influenza Whole 11/09/2004   Influenza,inj,Quad PF,6+ Mos 09/14/2016, 08/02/2018   Influenza-Unspecified 08/14/2014, 08/07/2015   PFIZER(Purple Top)SARS-COV-2 Vaccination 12/14/2019, 01/04/2020   Pneumococcal Conjugate-13 03/10/2016   Pneumococcal Polysaccharide-23 11/09/2004, 09/08/2010   Td 11/09/2002   Tdap 05/21/2013    TDAP status: Up to date  Flu Vaccine status: Up to date  Pneumococcal vaccine status: Up to date  Covid-19 vaccine status: Completed vaccines  Qualifies for Shingles Vaccine?  Yes   Zostavax completed No   Shingrix Completed?: No.  Education has been provided regarding the importance of this vaccine. Patient has been advised to call insurance company to determine out of pocket expense if they have not yet received this vaccine. Advised may also receive vaccine at local pharmacy or Health Dept. Verbalized acceptance and understanding.  Screening Tests Health Maintenance  Topic Date Due   Hepatitis C Screening  Never done   Zoster Vaccines- Shingrix (1 of 2) Never done   Pneumonia Vaccine 41+ Years old (3 - PPSV23 if available, else PCV20) 02/22/2018   MAMMOGRAM  08/06/2019   COVID-19 Vaccine (4 - Booster for Pfizer series) 11/25/2020   TETANUS/TDAP  05/22/2023   COLONOSCOPY (Pts 45-14yrs Insurance coverage will need to be confirmed)  06/17/2026   INFLUENZA VACCINE  Completed   DEXA SCAN  Completed   HPV VACCINES  Aged Out    Health Maintenance  Health Maintenance Due  Topic Date Due   Hepatitis C Screening  Never done   Zoster Vaccines- Shingrix (1 of 2) Never done   Pneumonia Vaccine 22+ Years old (3 - PPSV23 if available, else PCV20) 02/22/2018   MAMMOGRAM  08/06/2019   COVID-19 Vaccine (4 - Booster for [place] series) 11/25/2020    Colorectal cancer screening: Type of screening: Colonoscopy. Completed 06/17/2021. Repeat every 5 years  Mammogram status: Ordered 09/04/2021. Pt provided with contact info and advised to call to schedule appt.   Bone Density status: Completed 06/19/2012. Results reflect: Bone density results: OSTEOPENIA. Repeat every 5 years.  Lung Cancer Screening: (Low Dose CT Chest recommended if Age 39-80 years, 30 pack-year currently smoking OR have quit w/in 15years.) does not qualify.   Lung Cancer Screening Referral: n/a  Additional Screening:  Hepatitis C Screening: does qualify;   Vision Screening: Recommended annual ophthalmology exams for early detection of glaucoma and other disorders of the eye. Is the patient up to  date with their annual eye exam?  Yes  Who is the provider or what is the name of the office in which the patient attends annual eye exams? Dr.McFarland  If pt is not established with a provider, would they like to be referred to a provider to establish care? No .   Dental Screening: Recommended annual dental exams for proper oral hygiene  Community Resource Referral / Chronic Care Management: CRR required this visit?  No   CCM required this visit?  No      Plan:     I have personally reviewed and noted the following in the patient's chart:   Medical and social history Use of alcohol, tobacco or illicit drugs  Current medications and supplements including opioid prescriptions.  Functional ability and status Nutritional status Physical activity Advanced directives List of other physicians Hospitalizations, surgeries, and ER visits in previous 12 months Vitals Screenings to include cognitive, depression, and falls Referrals and appointments  In addition, I have reviewed and discussed with patient certain preventive protocols, quality metrics, and best practice recommendations. A written personalized care plan for preventive services as well as general preventive health recommendations were provided to patient.     Randel Pigg, LPN   [id number]   Nurse Notes: none

## 2021-09-09 ENCOUNTER — Encounter: Payer: Medicare HMO | Admitting: Family Medicine

## 2021-09-09 DIAGNOSIS — J3089 Other allergic rhinitis: Secondary | ICD-10-CM | POA: Diagnosis not present

## 2021-09-10 ENCOUNTER — Encounter: Payer: Medicare HMO | Admitting: Family Medicine

## 2021-09-16 DIAGNOSIS — J3089 Other allergic rhinitis: Secondary | ICD-10-CM | POA: Diagnosis not present

## 2021-09-23 ENCOUNTER — Other Ambulatory Visit: Payer: Self-pay

## 2021-09-23 DIAGNOSIS — J3089 Other allergic rhinitis: Secondary | ICD-10-CM | POA: Diagnosis not present

## 2021-09-23 MED ORDER — TERBINAFINE HCL 250 MG PO TABS: 250.0000 mg | ORAL_TABLET | Freq: Every day | ORAL | 1 refills | Status: DC

## 2021-09-23 MED ORDER — NYSTATIN 100000 UNIT/ML MT SUSP: 5.0000 mL | Freq: Four times a day (QID) | OROMUCOSAL | 1 refills | Status: DC

## 2021-09-29 DIAGNOSIS — J3089 Other allergic rhinitis: Secondary | ICD-10-CM | POA: Diagnosis not present

## 2021-09-30 ENCOUNTER — Encounter: Payer: Medicare HMO | Admitting: Family Medicine

## 2021-10-03 ENCOUNTER — Other Ambulatory Visit: Payer: Self-pay | Admitting: Family Medicine

## 2021-10-06 ENCOUNTER — Telehealth: Payer: Self-pay | Admitting: Family Medicine

## 2021-10-06 NOTE — Telephone Encounter (Signed)
Patient called stating that she was given levofloxacin earlier this month when she saw Dr. Sarajane Jews to help with her ears being stopped up and her nose running.  Patient says that she tried to order a refill at the pharmacy and [facility] will not refill it. Patient says that she is feeling the same way she felt before and would like a refill.  Informed patient that a visit with Dr. Sarajane Jews or another provider may be needed before refilling more medication.  Patient would like a call at [phone].  Please advise.

## 2021-10-06 NOTE — Telephone Encounter (Signed)
Dr. Barbie Banner patient.  Patient was seen on 08/22/21, and 09/02/21 for sinusitis.   Patient states she still feels the same.   Please advise if another appointment should be scheduled or if another antibiotic  can be sent to the pharmacy.

## 2021-10-07 DIAGNOSIS — J3089 Other allergic rhinitis: Secondary | ICD-10-CM | POA: Diagnosis not present

## 2021-10-07 NOTE — Telephone Encounter (Signed)
Please see below messages.   Patient has physical schedule for 10/20/21.   Please advise

## 2021-10-07 NOTE — Telephone Encounter (Signed)
Call in another 10 days of Levaquin 500 mg daily

## 2021-10-09 ENCOUNTER — Other Ambulatory Visit: Payer: Self-pay

## 2021-10-09 MED ORDER — LEVOFLOXACIN 500 MG PO TABS: 500.0000 mg | ORAL_TABLET | Freq: Every day | ORAL | 0 refills | Status: AC

## 2021-10-09 NOTE — Telephone Encounter (Signed)
Rx sent in per Dr Sarajane Jews, Left detailed message for pt regarding new Rx for Levaquin

## 2021-10-14 DIAGNOSIS — J3089 Other allergic rhinitis: Secondary | ICD-10-CM | POA: Diagnosis not present

## 2021-10-20 ENCOUNTER — Encounter: Payer: Self-pay | Admitting: Family Medicine

## 2021-10-20 ENCOUNTER — Ambulatory Visit (INDEPENDENT_AMBULATORY_CARE_PROVIDER_SITE_OTHER): Payer: Medicare HMO | Admitting: Family Medicine

## 2021-10-20 VITALS — BP 128/68 | HR 58 | Temp 98.1°F | Ht 61.0 in | Wt 191.5 lb

## 2021-10-20 DIAGNOSIS — G629 Polyneuropathy, unspecified: Secondary | ICD-10-CM | POA: Diagnosis not present

## 2021-10-20 DIAGNOSIS — Z0000 Encounter for general adult medical examination without abnormal findings: Secondary | ICD-10-CM | POA: Diagnosis not present

## 2021-10-20 DIAGNOSIS — E538 Deficiency of other specified B group vitamins: Secondary | ICD-10-CM | POA: Diagnosis not present

## 2021-10-20 LAB — CBC WITH DIFFERENTIAL/PLATELET
Basophils Absolute: 0.1 10*3/uL (ref 0.0–0.1)
Basophils Relative: 0.7 % (ref 0.0–3.0)
Eosinophils Absolute: 0.1 10*3/uL (ref 0.0–0.7)
Eosinophils Relative: 1.2 % (ref 0.0–5.0)
HCT: 37.2 % (ref 36.0–46.0)
Hemoglobin: 12.7 g/dL (ref 12.0–15.0)
Lymphocytes Relative: 20 % (ref 12.0–46.0)
Lymphs Abs: 1.7 10*3/uL (ref 0.7–4.0)
MCHC: 34.1 g/dL (ref 30.0–36.0)
MCV: 94.2 fl (ref 78.0–100.0)
Monocytes Absolute: 0.5 10*3/uL (ref 0.1–1.0)
Monocytes Relative: 6.2 % (ref 3.0–12.0)
Neutro Abs: 6 10*3/uL (ref 1.4–7.7)
Neutrophils Relative %: 71.9 % (ref 43.0–77.0)
Platelets: 187 10*3/uL (ref 150.0–400.0)
RBC: 3.95 Mil/uL (ref 3.87–5.11)
RDW: 15.1 % (ref 11.5–15.5)
WBC: 8.4 10*3/uL (ref 4.0–10.5)

## 2021-10-20 LAB — HEPATIC FUNCTION PANEL
ALT: 11 U/L (ref 0–35)
AST: 11 U/L (ref 0–37)
Albumin: 4.2 g/dL (ref 3.5–5.2)
Alkaline Phosphatase: 63 U/L (ref 39–117)
Bilirubin, Direct: 0.1 mg/dL (ref 0.0–0.3)
Total Bilirubin: 0.5 mg/dL (ref 0.2–1.2)
Total Protein: 6.8 g/dL (ref 6.0–8.3)

## 2021-10-20 LAB — LIPID PANEL
Cholesterol: 210 mg/dL — ABNORMAL HIGH (ref 0–200)
HDL: 56.3 mg/dL (ref >39.00)
LDL Cholesterol: 115 mg/dL — ABNORMAL HIGH (ref 0–99)
NonHDL: 153.4
Total CHOL/HDL Ratio: 4
Triglycerides: 192 mg/dL — ABNORMAL HIGH (ref 0.0–149.0)
VLDL: 38.4 mg/dL (ref 0.0–40.0)

## 2021-10-20 LAB — BASIC METABOLIC PANEL
BUN: 14 mg/dL (ref 6–23)
CO2: 27 mEq/L (ref 19–32)
Calcium: 9.5 mg/dL (ref 8.4–10.5)
Chloride: 105 mEq/L (ref 96–112)
Creatinine, Ser: 0.9 mg/dL (ref 0.40–1.20)
GFR: 65.62 mL/min (ref >60.00)
Glucose, Bld: 102 mg/dL — ABNORMAL HIGH (ref 70–99)
Potassium: 4.1 mEq/L (ref 3.5–5.1)
Sodium: 142 mEq/L (ref 135–145)

## 2021-10-20 LAB — VITAMIN B12: Vitamin B-12: 1153 pg/mL — ABNORMAL HIGH (ref 211–911)

## 2021-10-20 LAB — HEMOGLOBIN A1C: Hgb A1c MFr Bld: 5.8 % (ref 4.6–6.5)

## 2021-10-20 LAB — TSH: TSH: 1.5 u[IU]/mL (ref 0.35–5.50)

## 2021-10-20 MED ORDER — GABAPENTIN 100 MG PO CAPS: 100.0000 mg | ORAL_CAPSULE | Freq: Every day | ORAL | 3 refills | Status: DC

## 2021-10-20 NOTE — Progress Notes (Signed)
   Subjective:    Patient ID: [name], female    DOB: 1951/07/16, 70 y.o.   MRN: [id number]  HPI Here for a well exam. She feels well in general . She does mention some night time numbness and tingling in the toes and feet that started about 3 months ago. There is no pain.    Review of Systems  Constitutional: Negative.   HENT: Negative.    Eyes: Negative.   Respiratory: Negative.    Cardiovascular: Negative.   Gastrointestinal: Negative.   Genitourinary:  Negative for decreased urine volume, difficulty urinating, dyspareunia, dysuria, enuresis, flank pain, frequency, hematuria, pelvic pain and urgency.  Musculoskeletal: Negative.   Skin: Negative.   Neurological:  Positive for numbness. Negative for headaches.  Psychiatric/Behavioral: Negative.        Objective:   Physical Exam Constitutional:      General: She is not in acute distress.    Appearance: She is well-developed. She is obese.  HENT:     Head: Normocephalic and atraumatic.     Right Ear: External ear normal.     Left Ear: External ear normal.     Nose: Nose normal.     Mouth/Throat:     Pharynx: No oropharyngeal exudate.  Eyes:     General: No scleral icterus.    Conjunctiva/sclera: Conjunctivae normal.     Pupils: Pupils are equal, round, and reactive to light.  Neck:     Thyroid: No thyromegaly.     Vascular: No JVD.  Cardiovascular:     Rate and Rhythm: Normal rate and regular rhythm.     Heart sounds: Normal heart sounds. No murmur heard.   No friction rub. No gallop.  Pulmonary:     Effort: Pulmonary effort is normal. No respiratory distress.     Breath sounds: Normal breath sounds. No wheezing or rales.  Chest:     Chest wall: No tenderness.  Abdominal:     General: Bowel sounds are normal. There is no distension.     Palpations: Abdomen is soft. There is no mass.     Tenderness: There is no abdominal tenderness. There is no guarding or rebound.  Musculoskeletal:        General: No  tenderness. Normal range of motion.     Cervical back: Normal range of motion and neck supple.  Lymphadenopathy:     Cervical: No cervical adenopathy.  Skin:    General: Skin is warm and dry.     Findings: No erythema or rash.  Neurological:     Mental Status: She is alert and oriented to person, place, and time.     Cranial Nerves: No cranial nerve deficit.     Motor: No abnormal muscle tone.     Coordination: Coordination normal.     Deep Tendon Reflexes: Reflexes are normal and symmetric. Reflexes normal.  Psychiatric:        Behavior: Behavior normal.        Thought Content: Thought content normal.        Judgment: Judgment normal.          Assessment & Plan:  Well exam. We discussed diet and exercise. Get fasting labs. She has a neuropathy now, so we will check a B12 level as well. Try Gabapentin 100 mg at bedtime.  Alysia Penna, MD

## 2021-10-21 ENCOUNTER — Encounter: Payer: Self-pay | Admitting: Family Medicine

## 2021-10-22 DIAGNOSIS — J3089 Other allergic rhinitis: Secondary | ICD-10-CM | POA: Diagnosis not present

## 2021-10-22 DIAGNOSIS — Z1231 Encounter for screening mammogram for malignant neoplasm of breast: Secondary | ICD-10-CM | POA: Diagnosis not present

## 2021-10-22 NOTE — Telephone Encounter (Signed)
Tell her to stop the B12 pills

## 2021-10-24 DIAGNOSIS — J3089 Other allergic rhinitis: Secondary | ICD-10-CM | POA: Diagnosis not present

## 2021-10-24 DIAGNOSIS — J3081 Allergic rhinitis due to animal (cat) (dog) hair and dander: Secondary | ICD-10-CM | POA: Diagnosis not present

## 2021-10-24 DIAGNOSIS — J4540 Moderate persistent asthma, uncomplicated: Secondary | ICD-10-CM | POA: Diagnosis not present

## 2021-10-27 DIAGNOSIS — J3089 Other allergic rhinitis: Secondary | ICD-10-CM | POA: Diagnosis not present

## 2021-11-04 DIAGNOSIS — J3089 Other allergic rhinitis: Secondary | ICD-10-CM | POA: Diagnosis not present

## 2021-11-11 DIAGNOSIS — J3089 Other allergic rhinitis: Secondary | ICD-10-CM | POA: Diagnosis not present

## 2021-11-17 DIAGNOSIS — J3089 Other allergic rhinitis: Secondary | ICD-10-CM | POA: Diagnosis not present

## 2021-11-24 DIAGNOSIS — J3089 Other allergic rhinitis: Secondary | ICD-10-CM | POA: Diagnosis not present

## 2021-12-01 DIAGNOSIS — J3089 Other allergic rhinitis: Secondary | ICD-10-CM | POA: Diagnosis not present

## 2021-12-01 DIAGNOSIS — L814 Other melanin hyperpigmentation: Secondary | ICD-10-CM | POA: Diagnosis not present

## 2021-12-01 DIAGNOSIS — L821 Other seborrheic keratosis: Secondary | ICD-10-CM | POA: Diagnosis not present

## 2021-12-01 DIAGNOSIS — D1801 Hemangioma of skin and subcutaneous tissue: Secondary | ICD-10-CM | POA: Diagnosis not present

## 2021-12-01 DIAGNOSIS — D225 Melanocytic nevi of trunk: Secondary | ICD-10-CM | POA: Diagnosis not present

## 2021-12-02 ENCOUNTER — Other Ambulatory Visit: Payer: Self-pay | Admitting: Family Medicine

## 2021-12-02 NOTE — Telephone Encounter (Signed)
Pt LOV was on 10/20/21 Last refill was done on 05/19/2021 Please advise

## 2021-12-03 DIAGNOSIS — J3089 Other allergic rhinitis: Secondary | ICD-10-CM | POA: Diagnosis not present

## 2021-12-08 DIAGNOSIS — J3089 Other allergic rhinitis: Secondary | ICD-10-CM | POA: Diagnosis not present

## 2021-12-10 DIAGNOSIS — J3089 Other allergic rhinitis: Secondary | ICD-10-CM | POA: Diagnosis not present

## 2021-12-15 DIAGNOSIS — J3089 Other allergic rhinitis: Secondary | ICD-10-CM | POA: Diagnosis not present

## 2021-12-23 DIAGNOSIS — J3089 Other allergic rhinitis: Secondary | ICD-10-CM | POA: Diagnosis not present

## 2021-12-29 DIAGNOSIS — J3089 Other allergic rhinitis: Secondary | ICD-10-CM | POA: Diagnosis not present

## 2021-12-31 ENCOUNTER — Other Ambulatory Visit: Payer: Self-pay | Admitting: Family Medicine

## 2022-01-01 ENCOUNTER — Telehealth: Payer: Self-pay | Admitting: Family Medicine

## 2022-01-01 NOTE — Telephone Encounter (Signed)
Patient called in stating that [employer]/ [employer] sent her a letter stating that they tried to request all her medication for refills but Dr.Fry denied all requests. Patient is requesting a phone call back with a reason on why that happened.  Patient could be contacted at [phone].  Please advise.

## 2022-01-01 NOTE — Telephone Encounter (Signed)
Left a detailed message for pt to call the office back regarding her refills request

## 2022-01-02 ENCOUNTER — Other Ambulatory Visit: Payer: Self-pay

## 2022-01-02 DIAGNOSIS — K219 Gastro-esophageal reflux disease without esophagitis: Secondary | ICD-10-CM

## 2022-01-02 DIAGNOSIS — I10 Essential (primary) hypertension: Secondary | ICD-10-CM

## 2022-01-02 MED ORDER — OMEPRAZOLE 20 MG PO CPDR: 20.0000 mg | DELAYED_RELEASE_CAPSULE | Freq: Every day | ORAL | 1 refills | Status: DC

## 2022-01-02 MED ORDER — LISINOPRIL-HYDROCHLOROTHIAZIDE 20-12.5 MG PO TABS: 1.0000 | ORAL_TABLET | Freq: Every day | ORAL | 1 refills | Status: DC

## 2022-01-02 MED ORDER — POTASSIUM CHLORIDE CRYS ER 20 MEQ PO TBCR: 20.0000 meq | EXTENDED_RELEASE_TABLET | Freq: Every day | ORAL | 1 refills | Status: DC

## 2022-01-02 NOTE — Telephone Encounter (Signed)
Lvm for patient to call back to discuss message, or send my chart message with requested refills.

## 2022-01-02 NOTE — Telephone Encounter (Signed)
Spoke with patient.  Refills sent

## 2022-01-05 DIAGNOSIS — J3089 Other allergic rhinitis: Secondary | ICD-10-CM | POA: Diagnosis not present

## 2022-01-12 DIAGNOSIS — J3081 Allergic rhinitis due to animal (cat) (dog) hair and dander: Secondary | ICD-10-CM | POA: Diagnosis not present

## 2022-01-12 DIAGNOSIS — J301 Allergic rhinitis due to pollen: Secondary | ICD-10-CM | POA: Diagnosis not present

## 2022-01-12 DIAGNOSIS — J3089 Other allergic rhinitis: Secondary | ICD-10-CM | POA: Diagnosis not present

## 2022-01-15 DIAGNOSIS — H35363 Drusen (degenerative) of macula, bilateral: Secondary | ICD-10-CM | POA: Diagnosis not present

## 2022-01-19 DIAGNOSIS — J3089 Other allergic rhinitis: Secondary | ICD-10-CM | POA: Diagnosis not present

## 2022-01-27 DIAGNOSIS — J3089 Other allergic rhinitis: Secondary | ICD-10-CM | POA: Diagnosis not present

## 2022-02-03 DIAGNOSIS — J3089 Other allergic rhinitis: Secondary | ICD-10-CM | POA: Diagnosis not present

## 2022-02-04 ENCOUNTER — Emergency Department (HOSPITAL_COMMUNITY): Payer: Medicare HMO

## 2022-02-04 ENCOUNTER — Other Ambulatory Visit: Payer: Self-pay

## 2022-02-04 ENCOUNTER — Emergency Department (HOSPITAL_COMMUNITY)
Admission: EM | Admit: 2022-02-04 | Discharge: 2022-02-04 | Disposition: A | Discharge status: Home / Self Care | Payer: Medicare HMO | Attending: Emergency Medicine | Admitting: Emergency Medicine

## 2022-02-04 ENCOUNTER — Encounter (HOSPITAL_COMMUNITY): Payer: Self-pay | Admitting: Emergency Medicine

## 2022-02-04 DIAGNOSIS — S80212A Abrasion, left knee, initial encounter: Secondary | ICD-10-CM | POA: Diagnosis not present

## 2022-02-04 DIAGNOSIS — Z7982 Long term (current) use of aspirin: Secondary | ICD-10-CM | POA: Diagnosis not present

## 2022-02-04 DIAGNOSIS — S0993XA Unspecified injury of face, initial encounter: Secondary | ICD-10-CM | POA: Diagnosis not present

## 2022-02-04 DIAGNOSIS — S8011XA Contusion of right lower leg, initial encounter: Secondary | ICD-10-CM | POA: Diagnosis not present

## 2022-02-04 DIAGNOSIS — S20319A Abrasion of unspecified front wall of thorax, initial encounter: Secondary | ICD-10-CM | POA: Diagnosis not present

## 2022-02-04 DIAGNOSIS — S0003XA Contusion of scalp, initial encounter: Secondary | ICD-10-CM | POA: Diagnosis not present

## 2022-02-04 DIAGNOSIS — M47812 Spondylosis without myelopathy or radiculopathy, cervical region: Secondary | ICD-10-CM | POA: Diagnosis not present

## 2022-02-04 DIAGNOSIS — I10 Essential (primary) hypertension: Secondary | ICD-10-CM | POA: Diagnosis not present

## 2022-02-04 DIAGNOSIS — J45909 Unspecified asthma, uncomplicated: Secondary | ICD-10-CM | POA: Insufficient documentation

## 2022-02-04 DIAGNOSIS — Y92410 Unspecified street and highway as the place of occurrence of the external cause: Secondary | ICD-10-CM | POA: Diagnosis not present

## 2022-02-04 DIAGNOSIS — M79604 Pain in right leg: Secondary | ICD-10-CM | POA: Diagnosis not present

## 2022-02-04 DIAGNOSIS — S60222A Contusion of left hand, initial encounter: Secondary | ICD-10-CM | POA: Diagnosis not present

## 2022-02-04 DIAGNOSIS — S80211A Abrasion, right knee, initial encounter: Secondary | ICD-10-CM | POA: Insufficient documentation

## 2022-02-04 DIAGNOSIS — Z23 Encounter for immunization: Secondary | ICD-10-CM | POA: Insufficient documentation

## 2022-02-04 DIAGNOSIS — Z041 Encounter for examination and observation following transport accident: Secondary | ICD-10-CM | POA: Diagnosis not present

## 2022-02-04 DIAGNOSIS — Z7951 Long term (current) use of inhaled steroids: Secondary | ICD-10-CM | POA: Insufficient documentation

## 2022-02-04 DIAGNOSIS — S0083XA Contusion of other part of head, initial encounter: Secondary | ICD-10-CM | POA: Insufficient documentation

## 2022-02-04 DIAGNOSIS — S0990XA Unspecified injury of head, initial encounter: Secondary | ICD-10-CM | POA: Diagnosis not present

## 2022-02-04 DIAGNOSIS — Z79899 Other long term (current) drug therapy: Secondary | ICD-10-CM | POA: Diagnosis not present

## 2022-02-04 DIAGNOSIS — S8991XA Unspecified injury of right lower leg, initial encounter: Secondary | ICD-10-CM | POA: Diagnosis present

## 2022-02-04 DIAGNOSIS — S199XXA Unspecified injury of neck, initial encounter: Secondary | ICD-10-CM | POA: Diagnosis not present

## 2022-02-04 MED ORDER — LIDOCAINE 5 % EX PTCH: 1.0000 | MEDICATED_PATCH | Freq: Every day | CUTANEOUS | 0 refills | Status: DC | PRN

## 2022-02-04 MED ORDER — TRAMADOL HCL 50 MG PO TABS: 50.0000 mg | ORAL_TABLET | Freq: Four times a day (QID) | ORAL | 0 refills | Status: DC | PRN

## 2022-02-04 MED ORDER — TETANUS-DIPHTH-ACELL PERTUSSIS 5-2.5-18.5 LF-MCG/0.5 IM SUSY
0.5000 mL | PREFILLED_SYRINGE | Freq: Once | INTRAMUSCULAR | Status: AC
  Administered 2022-02-04: 0.5 mL via INTRAMUSCULAR
  Filled 2022-02-04: qty 0.5

## 2022-02-04 MED ORDER — ACETAMINOPHEN 500 MG PO TABS: 500.0000 mg | ORAL_TABLET | Freq: Four times a day (QID) | ORAL | 0 refills | Status: DC | PRN

## 2022-02-04 MED ORDER — HYDROCODONE-ACETAMINOPHEN 5-325 MG PO TABS
1.0000 | ORAL_TABLET | Freq: Once | ORAL | Status: AC
  Administered 2022-02-04: 1 via ORAL
  Filled 2022-02-04: qty 1
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
## 2022-02-04 NOTE — Discharge Instructions (Addendum)
It was a pleasure caring for you today in the emergency department. ° °Please return to the emergency department for any worsening or worrisome symptoms. ° ° °

## 2022-02-09 ENCOUNTER — Ambulatory Visit (INDEPENDENT_AMBULATORY_CARE_PROVIDER_SITE_OTHER): Payer: Medicare HMO | Admitting: Family Medicine

## 2022-02-09 ENCOUNTER — Encounter: Payer: Self-pay | Admitting: Family Medicine

## 2022-02-09 VITALS — BP 124/74 | HR 71 | Temp 98.2°F | Wt 189.0 lb

## 2022-02-09 DIAGNOSIS — S80811D Abrasion, right lower leg, subsequent encounter: Secondary | ICD-10-CM | POA: Diagnosis not present

## 2022-02-09 DIAGNOSIS — L03115 Cellulitis of right lower limb: Secondary | ICD-10-CM

## 2022-02-09 DIAGNOSIS — J3089 Other allergic rhinitis: Secondary | ICD-10-CM | POA: Diagnosis not present

## 2022-02-09 DIAGNOSIS — S0083XD Contusion of other part of head, subsequent encounter: Secondary | ICD-10-CM | POA: Diagnosis not present

## 2022-02-09 MED ORDER — CEPHALEXIN 500 MG PO CAPS: 500.0000 mg | ORAL_CAPSULE | Freq: Three times a day (TID) | ORAL | 0 refills | Status: AC
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
## 2022-02-16 ENCOUNTER — Ambulatory Visit (INDEPENDENT_AMBULATORY_CARE_PROVIDER_SITE_OTHER): Payer: Medicare HMO | Admitting: Family Medicine

## 2022-02-16 ENCOUNTER — Encounter: Payer: Self-pay | Admitting: Family Medicine

## 2022-02-16 VITALS — BP 110/68 | HR 65 | Temp 98.4°F | Ht 60.0 in | Wt 191.0 lb

## 2022-02-16 DIAGNOSIS — S80811D Abrasion, right lower leg, subsequent encounter: Secondary | ICD-10-CM | POA: Diagnosis not present

## 2022-02-16 DIAGNOSIS — S20211D Contusion of right front wall of thorax, subsequent encounter: Secondary | ICD-10-CM | POA: Diagnosis not present

## 2022-02-16 DIAGNOSIS — J3089 Other allergic rhinitis: Secondary | ICD-10-CM | POA: Diagnosis not present
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
## 2022-02-24 ENCOUNTER — Other Ambulatory Visit: Payer: Self-pay

## 2022-02-24 ENCOUNTER — Telehealth: Payer: Self-pay | Admitting: Family Medicine

## 2022-02-24 DIAGNOSIS — G629 Polyneuropathy, unspecified: Secondary | ICD-10-CM

## 2022-02-24 MED ORDER — GABAPENTIN 100 MG PO CAPS: 100.0000 mg | ORAL_CAPSULE | Freq: Every day | ORAL | 3 refills | Status: DC

## 2022-02-24 NOTE — Telephone Encounter (Signed)
Pt is calling and needs a refill gabapentin (NEURONTIN) 100 MG capsule  ?[id number] [place] [address]:  [phone] ?Fax:  [phone] ?  ? ?

## 2022-02-24 NOTE — Telephone Encounter (Signed)
noted 

## 2022-02-24 NOTE — Telephone Encounter (Signed)
[facility] to [facility].  ?

## 2022-02-25 ENCOUNTER — Encounter: Payer: Self-pay | Admitting: Family Medicine

## 2022-02-25 DIAGNOSIS — J3089 Other allergic rhinitis: Secondary | ICD-10-CM | POA: Diagnosis not present

## 2022-02-25 DIAGNOSIS — M25561 Pain in right knee: Secondary | ICD-10-CM | POA: Diagnosis not present

## 2022-02-25 DIAGNOSIS — M25562 Pain in left knee: Secondary | ICD-10-CM | POA: Diagnosis not present

## 2022-02-26 ENCOUNTER — Telehealth (INDEPENDENT_AMBULATORY_CARE_PROVIDER_SITE_OTHER): Payer: Medicare HMO | Admitting: Family Medicine

## 2022-02-26 ENCOUNTER — Telehealth: Payer: Self-pay | Admitting: Family Medicine

## 2022-02-26 DIAGNOSIS — H938X3 Other specified disorders of ear, bilateral: Secondary | ICD-10-CM

## 2022-02-26 DIAGNOSIS — R059 Cough, unspecified: Secondary | ICD-10-CM | POA: Diagnosis not present

## 2022-02-26 MED ORDER — BENZONATATE 100 MG PO CAPS: 100.0000 mg | ORAL_CAPSULE | Freq: Three times a day (TID) | ORAL | 0 refills | Status: DC | PRN

## 2022-02-26 MED ORDER — AMOXICILLIN-POT CLAVULANATE 875-125 MG PO TABS: 1.0000 | ORAL_TABLET | Freq: Two times a day (BID) | ORAL | 0 refills | Status: DC

## 2022-02-26 NOTE — Patient Instructions (Signed)
-  I sent the medication(s) we discussed to your pharmacy: ?Meds ordered this encounter  ?Medications  ? benzonatate (TESSALON PERLES) 100 MG capsule  ?  Sig: Take 1 capsule (100 mg total) by mouth 3 (three) times daily as needed.  ?  Dispense:  30 capsule  ?  Refill:  0  ? amoxicillin-clavulanate (AUGMENTIN) 875-125 MG tablet  ?  Sig: Take 1 tablet by mouth 2 (two) times daily.  ?  Dispense:  14 tablet  ?  Refill:  0  ? ?Nasal saline rinse twice daily. ? ?I hope you are feeling better soon! ? ?Seek in person care promptly if your symptoms worsen, new concerns arise or you are not improving with treatment. ? ?It was nice to meet you today. I help [facility] out with telemedicine visits on Tuesdays and Thursdays and am happy to help if you need a virtual follow up visit on those days. Otherwise, if you have any concerns or questions following this visit please schedule a follow up visit with your Primary Care office or seek care at a local urgent care clinic to avoid delays in care. If you are having severe or life threatening symptoms please call 911 and/or go to the nearest emergency room.  ? ?

## 2022-02-26 NOTE — Progress Notes (Signed)
Virtual Visit via Video Note ? ?I connected with [name]  ? on 02/26/22 at  3:20 PM EDT by a video enabled telemedicine application and verified that I am speaking with the correct person using two identifiers. ? Location patient: [place] ?Location provider:work or home office ?Persons participating in the virtual visit: patient, provider ? ?I discussed the limitations and requested verbal permission for telemedicine visit. The patient expressed understanding and agreed to proceed. ? ? ?HPI: ? ?Acute telemedicine visit for cough and ear issues: ?-Onset: about 8 days ago - she has bead allergies ?-Symptoms include: cough, nasal congestion, laryngitis, ears now feel full, she has started coughing up brownish green mucus ?-reports her doctor usually gives her an abx and/or prednisone for this ?-Denies:CP, SOB, NVD, fever, malaise, ear pain, wheezing or issues with her asthma ?-Has tried:musinex, benadryl ?-Pertinent past medical history: see below ?-Pertinent medication allergies: ?Allergies  ?Allergen Reactions  ? Doxycycline   ?  REACTION: jittery  ?-COVID-19 vaccine status:  ?Immunization History  ?Administered Date(s) Administered  ? Fluad Quad(high Dose 65+) 08/24/2019, 08/26/2020  ? Influenza Split 08/10/2011, 08/09/2012, 08/02/2013  ? Influenza Whole 11/09/2004  ? Influenza,inj,Quad PF,6+ Mos 09/14/2016, 08/02/2018  ? Influenza-Unspecified 08/14/2014, 08/07/2015, 10/08/2021  ? PFIZER(Purple Top)SARS-COV-2 Vaccination 12/14/2019, 01/04/2020  ? Pneumococcal Conjugate-13 03/10/2016  ? Pneumococcal Polysaccharide-23 11/09/2004, 09/08/2010  ? Td 11/09/2002  ? Tdap 05/21/2013, 02/04/2022  ? ? ? ?ROS: See pertinent positives and negatives per HPI. ? ?Past Medical History:  ?Diagnosis Date  ? Allergy   ? Asthma   ? sees Dr. Baird Lyons  ? Colon polyp 2007  ? Depression   ? Diverticulosis   ? GERD (gastroesophageal reflux disease)   ? Gynecological examination   ? sees Dr. Paula Compton  ? Hyperlipemia   ? Hypertension    ? ? ?Past Surgical History:  ?Procedure Laterality Date  ? CARPAL TUNNEL RELEASE Right 11/09/1978  ? COLONOSCOPY  06/17/2021  ? per Dr. Tarri Glenn, adenimatous polyps, repeat in 3 yrs  ? DILATION AND CURETTAGE OF UTERUS  11/10/2003  ? KNEE SURGERY    ? ? ? ?Current Outpatient Medications:  ?  amoxicillin-clavulanate (AUGMENTIN) 875-125 MG tablet, Take 1 tablet by mouth 2 (two) times daily., Disp: 14 tablet, Rfl: 0 ?  benzonatate (TESSALON PERLES) 100 MG capsule, Take 1 capsule (100 mg total) by mouth 3 (three) times daily as needed., Disp: 30 capsule, Rfl: 0 ?  acetaminophen (TYLENOL) 500 MG tablet, Take 1 tablet (500 mg total) by mouth every 6 (six) hours as needed., Disp: 36 tablet, Rfl: 0 ?  albuterol (PROAIR HFA) 108 (90 Base) MCG/ACT inhaler, INHALE 2 PUFFS INTO THE LUNGS EVERY 6 HOURS AS NEEDED FOR WHEEZING., Disp: 3 Inhaler, Rfl: 3 ?  aspirin 81 MG tablet, Take 81 mg by mouth daily., Disp: , Rfl:  ?  budesonide-formoterol (SYMBICORT) 80-4.5 MCG/ACT inhaler, 2 puffs then rinse mouth, twice daily maintenance inhaler, Disp: 3 Inhaler, Rfl: 3 ?  EPINEPHrine 0.3 mg/0.3 mL IJ SOAJ injection, Use as directed for severe allergic reaction, Disp: 2 Device, Rfl: 1 ?  Flaxseed, Linseed, (FLAX SEED OIL) 1000 MG CAPS, Take 1 capsule by mouth daily., Disp: , Rfl:  ?  fluticasone (FLONASE) 50 MCG/ACT nasal spray, USE 1 SPRAY IN EACH NOSTRIL TWICE DAILY, Disp: 48 g, Rfl: 3 ?  gabapentin (NEURONTIN) 100 MG capsule, Take 1 capsule (100 mg total) by mouth at bedtime., Disp: 30 capsule, Rfl: 3 ?  HYDROcodone bit-homatropine (HYCODAN) 5-1.5 MG/5ML syrup, Take 5 mLs by  mouth every 4 (four) hours as needed for cough., Disp: 240 mL, Rfl: 0 ?  ketoconazole (NIZORAL) 2 % cream, Apply 1 application topically daily., Disp: 30 g, Rfl: 2 ?  lidocaine (LIDODERM) 5 %, Place 1 patch onto the skin daily as needed. Remove & Discard patch within 12 hours or as directed by MD, Disp: 15 patch, Rfl: 0 ?  lisinopril-hydrochlorothiazide (ZESTORETIC)  20-12.5 MG tablet, Take 1 tablet by mouth daily., Disp: 90 tablet, Rfl: 1 ?  LORazepam (ATIVAN) 1 MG tablet, TAKE 1 TABLET EVERY 8 HOURS AS NEEDED FOR ANXIETY, Disp: 90 tablet, Rfl: 5 ?  NON FORMULARY, Allergy vaccine. Once a week, Disp: , Rfl:  ?  nystatin (MYCOSTATIN) 100000 UNIT/ML suspension, Take 5 mLs (500,000 Units total) by mouth 4 (four) times daily., Disp: 480 mL, Rfl: 1 ?  omeprazole (PRILOSEC) 20 MG capsule, Take 1 capsule (20 mg total) by mouth daily., Disp: 90 capsule, Rfl: 1 ?  potassium chloride SA (KLOR-CON M) 20 MEQ tablet, Take 1 tablet (20 mEq total) by mouth daily., Disp: 90 tablet, Rfl: 1 ?  pyridOXINE (VITAMIN B-6) 100 MG tablet, Take 100 mg by mouth daily., Disp: , Rfl:  ?  terbinafine (LAMISIL) 250 MG tablet, Take 1 tablet (250 mg total) by mouth daily., Disp: 90 tablet, Rfl: 1 ?  traMADol (ULTRAM) 50 MG tablet, Take 1 tablet (50 mg total) by mouth every 6 (six) hours as needed., Disp: 15 tablet, Rfl: 0 ?  vitamin B-12 (CYANOCOBALAMIN) 100 MCG tablet, Take 100 mcg by mouth daily., Disp: , Rfl:  ? ?EXAM: ? ?VITALS per patient if applicable: ? ?GENERAL: alert, oriented, appears well and in no acute distress ? ?HEENT: atraumatic, conjunttiva clear, no obvious abnormalities on inspection of external nose and ears, no redness or abnormality of ext ears appreciated on video visit ? ?NECK: normal movements of the head and neck ? ?LUNGS: on inspection no signs of respiratory distress, breathing rate appears normal, no obvious gross SOB, gasping or wheezing, occ wet cough ? ?CV: no obvious cyanosis ? ?MS: moves all visible extremities without noticeable abnormality ? ?PSYCH/NEURO: pleasant and cooperative, no obvious depression or anxiety, speech and thought processing grossly intact ? ?ASSESSMENT AND PLAN: ? ?Discussed the following assessment and plan: ? ?Cough, unspecified type ? ?Sensation of fullness in both ears ? ?-we discussed possible serious and likely etiologies, options for evaluation and  workup, limitations of telemedicine visit vs in person visit, treatment, treatment risks and precautions. Pt is agreeable to treatment via telemedicine at this moment. Query VURI, possible developing 2ndary bacterial resp infection given increased discolored mucus production and worsening, vs covid (out of tx window) vs other. She prefers to try nasal saline rinse, continuation of asthma allergy regimen, tessalon for cough and initiation of empiric abx if worsening or not improving with other measures. Discussed appropriate use and risks with abx.  ?Advised to seek prompt virtual visit or in person care if worsening, new symptoms arise, or if is not improving with treatment as expected per our conversation of expected course. Discussed options for follow up care. Did let this patient know that I do telemedicine on Tuesdays and Thursdays for [facility] and those are the days I am logged into the system. Advised to schedule follow up visit with PCP, [facility] virtual visits or [facility] if any further questions or concerns to avoid delays in care. ?  ?I discussed the assessment and treatment plan with the patient. The patient was provided an opportunity to ask  questions and all were answered. The patient agreed with the plan and demonstrated an understanding of the instructions. ?  ? ? ?Lucretia Kern, DO  ? ?

## 2022-02-26 NOTE — Telephone Encounter (Signed)
FYI ? ?Patient sent My Chart message with exact request. ?

## 2022-02-26 NOTE — Telephone Encounter (Signed)
Paient is having chest congestion, hoarse, and ear pain. She would like for Dr Sarajane Jews to call in Rx. I informed patient that he will not be in office until Monday however I will send a message back.  I also got patient scheduled for a virtual appointment with Dr. Maudie Mercury this afternoon.   ?Patient also stated that Dr Sarajane Jews is aware of this condition/situation because she gets it 2 times a year.  ?

## 2022-03-02 ENCOUNTER — Other Ambulatory Visit: Payer: Self-pay

## 2022-03-02 MED ORDER — AZITHROMYCIN 250 MG PO TABS: ORAL_TABLET | ORAL | 0 refills | Status: AC

## 2022-03-02 NOTE — Telephone Encounter (Signed)
Call in a Zpack  ?

## 2022-03-03 DIAGNOSIS — J3089 Other allergic rhinitis: Secondary | ICD-10-CM | POA: Diagnosis not present

## 2022-03-10 DIAGNOSIS — J3089 Other allergic rhinitis: Secondary | ICD-10-CM | POA: Diagnosis not present

## 2022-03-10 DIAGNOSIS — J301 Allergic rhinitis due to pollen: Secondary | ICD-10-CM | POA: Diagnosis not present

## 2022-03-10 DIAGNOSIS — J3081 Allergic rhinitis due to animal (cat) (dog) hair and dander: Secondary | ICD-10-CM | POA: Diagnosis not present

## 2022-03-16 DIAGNOSIS — J3089 Other allergic rhinitis: Secondary | ICD-10-CM | POA: Diagnosis not present

## 2022-03-16 DIAGNOSIS — M25561 Pain in right knee: Secondary | ICD-10-CM | POA: Diagnosis not present

## 2022-03-23 ENCOUNTER — Telehealth: Payer: Self-pay

## 2022-03-23 DIAGNOSIS — J3089 Other allergic rhinitis: Secondary | ICD-10-CM | POA: Diagnosis not present

## 2022-03-23 NOTE — Telephone Encounter (Signed)
Caller states she has a sore throat. She went to get ?her shingle shot and her lips and tongue is swollen. ? ?03/20/2022 8:49:15 AM Attempt made - message left Alveta Heimlich, RN, Rise Paganini ?03/20/2022 9:00:23 AM FINAL ATTEMPT MADE - ?message left Alveta Heimlich, RN, Rise Paganini ? ?03/23/22 1058 - LVM instructions for pt to call back if she needs to.  ?

## 2022-03-30 ENCOUNTER — Other Ambulatory Visit: Payer: Self-pay

## 2022-03-30 ENCOUNTER — Other Ambulatory Visit: Payer: Self-pay | Admitting: Family Medicine

## 2022-03-30 DIAGNOSIS — J3089 Other allergic rhinitis: Secondary | ICD-10-CM | POA: Diagnosis not present

## 2022-03-31 DIAGNOSIS — J3089 Other allergic rhinitis: Secondary | ICD-10-CM | POA: Diagnosis not present

## 2022-04-07 DIAGNOSIS — J3081 Allergic rhinitis due to animal (cat) (dog) hair and dander: Secondary | ICD-10-CM | POA: Diagnosis not present

## 2022-04-07 DIAGNOSIS — J301 Allergic rhinitis due to pollen: Secondary | ICD-10-CM | POA: Diagnosis not present

## 2022-04-07 DIAGNOSIS — J3089 Other allergic rhinitis: Secondary | ICD-10-CM | POA: Diagnosis not present

## 2022-04-13 DIAGNOSIS — J3089 Other allergic rhinitis: Secondary | ICD-10-CM | POA: Diagnosis not present

## 2022-04-13 DIAGNOSIS — J301 Allergic rhinitis due to pollen: Secondary | ICD-10-CM | POA: Diagnosis not present

## 2022-04-13 DIAGNOSIS — J3081 Allergic rhinitis due to animal (cat) (dog) hair and dander: Secondary | ICD-10-CM | POA: Diagnosis not present

## 2022-04-20 DIAGNOSIS — J301 Allergic rhinitis due to pollen: Secondary | ICD-10-CM | POA: Diagnosis not present

## 2022-04-20 DIAGNOSIS — J3081 Allergic rhinitis due to animal (cat) (dog) hair and dander: Secondary | ICD-10-CM | POA: Diagnosis not present

## 2022-04-20 DIAGNOSIS — J3089 Other allergic rhinitis: Secondary | ICD-10-CM | POA: Diagnosis not present

## 2022-04-27 DIAGNOSIS — J3089 Other allergic rhinitis: Secondary | ICD-10-CM | POA: Diagnosis not present

## 2022-04-30 DIAGNOSIS — J301 Allergic rhinitis due to pollen: Secondary | ICD-10-CM | POA: Diagnosis not present

## 2022-04-30 DIAGNOSIS — J3081 Allergic rhinitis due to animal (cat) (dog) hair and dander: Secondary | ICD-10-CM | POA: Diagnosis not present

## 2022-04-30 DIAGNOSIS — J3089 Other allergic rhinitis: Secondary | ICD-10-CM | POA: Diagnosis not present

## 2022-05-04 DIAGNOSIS — J3089 Other allergic rhinitis: Secondary | ICD-10-CM | POA: Diagnosis not present

## 2022-05-06 DIAGNOSIS — J3089 Other allergic rhinitis: Secondary | ICD-10-CM | POA: Diagnosis not present

## 2022-05-13 DIAGNOSIS — J3089 Other allergic rhinitis: Secondary | ICD-10-CM | POA: Diagnosis not present

## 2022-05-13 DIAGNOSIS — J3081 Allergic rhinitis due to animal (cat) (dog) hair and dander: Secondary | ICD-10-CM | POA: Diagnosis not present

## 2022-05-13 DIAGNOSIS — J301 Allergic rhinitis due to pollen: Secondary | ICD-10-CM | POA: Diagnosis not present

## 2022-05-14 ENCOUNTER — Telehealth: Payer: Self-pay

## 2022-05-14 NOTE — Telephone Encounter (Signed)
Last documented mammogram 08/05/2018. Pt states she had one done last year & gives verbal consent for me to request records from Dr Marvel Plan.

## 2022-05-19 DIAGNOSIS — J3089 Other allergic rhinitis: Secondary | ICD-10-CM | POA: Diagnosis not present

## 2022-05-25 DIAGNOSIS — J301 Allergic rhinitis due to pollen: Secondary | ICD-10-CM | POA: Diagnosis not present

## 2022-05-25 DIAGNOSIS — J3081 Allergic rhinitis due to animal (cat) (dog) hair and dander: Secondary | ICD-10-CM | POA: Diagnosis not present

## 2022-05-25 DIAGNOSIS — J3089 Other allergic rhinitis: Secondary | ICD-10-CM | POA: Diagnosis not present

## 2022-05-28 ENCOUNTER — Other Ambulatory Visit: Payer: Self-pay | Admitting: Family Medicine

## 2022-05-28 DIAGNOSIS — K219 Gastro-esophageal reflux disease without esophagitis: Secondary | ICD-10-CM

## 2022-05-28 DIAGNOSIS — I10 Essential (primary) hypertension: Secondary | ICD-10-CM

## 2022-06-01 DIAGNOSIS — J3089 Other allergic rhinitis: Secondary | ICD-10-CM | POA: Diagnosis not present

## 2022-06-01 DIAGNOSIS — J301 Allergic rhinitis due to pollen: Secondary | ICD-10-CM | POA: Diagnosis not present

## 2022-06-01 DIAGNOSIS — J3081 Allergic rhinitis due to animal (cat) (dog) hair and dander: Secondary | ICD-10-CM | POA: Diagnosis not present

## 2022-06-03 ENCOUNTER — Encounter (HOSPITAL_COMMUNITY): Payer: Self-pay | Admitting: Emergency Medicine

## 2022-06-03 ENCOUNTER — Observation Stay (HOSPITAL_COMMUNITY)
Admission: EM | Admit: 2022-06-03 | Discharge: 2022-06-04 | Disposition: A | Discharge status: Home / Self Care | Payer: Medicare HMO | Attending: Family Medicine | Admitting: Family Medicine

## 2022-06-03 DIAGNOSIS — Z79899 Other long term (current) drug therapy: Secondary | ICD-10-CM | POA: Diagnosis not present

## 2022-06-03 DIAGNOSIS — Z7982 Long term (current) use of aspirin: Secondary | ICD-10-CM | POA: Insufficient documentation

## 2022-06-03 DIAGNOSIS — R739 Hyperglycemia, unspecified: Secondary | ICD-10-CM | POA: Diagnosis not present

## 2022-06-03 DIAGNOSIS — J4520 Mild intermittent asthma, uncomplicated: Secondary | ICD-10-CM

## 2022-06-03 DIAGNOSIS — F419 Anxiety disorder, unspecified: Secondary | ICD-10-CM

## 2022-06-03 DIAGNOSIS — K649 Unspecified hemorrhoids: Secondary | ICD-10-CM | POA: Diagnosis not present

## 2022-06-03 DIAGNOSIS — Z6837 Body mass index (BMI) 37.0-37.9, adult: Secondary | ICD-10-CM | POA: Insufficient documentation

## 2022-06-03 DIAGNOSIS — N390 Urinary tract infection, site not specified: Secondary | ICD-10-CM | POA: Diagnosis present

## 2022-06-03 DIAGNOSIS — I959 Hypotension, unspecified: Secondary | ICD-10-CM | POA: Diagnosis not present

## 2022-06-03 DIAGNOSIS — D649 Anemia, unspecified: Secondary | ICD-10-CM | POA: Diagnosis not present

## 2022-06-03 DIAGNOSIS — R42 Dizziness and giddiness: Principal | ICD-10-CM | POA: Insufficient documentation

## 2022-06-03 DIAGNOSIS — K625 Hemorrhage of anus and rectum: Secondary | ICD-10-CM

## 2022-06-03 DIAGNOSIS — E876 Hypokalemia: Secondary | ICD-10-CM | POA: Diagnosis not present

## 2022-06-03 DIAGNOSIS — I10 Essential (primary) hypertension: Secondary | ICD-10-CM | POA: Insufficient documentation

## 2022-06-03 DIAGNOSIS — J45909 Unspecified asthma, uncomplicated: Secondary | ICD-10-CM | POA: Diagnosis not present

## 2022-06-03 DIAGNOSIS — E861 Hypovolemia: Secondary | ICD-10-CM | POA: Diagnosis not present

## 2022-06-03 DIAGNOSIS — R001 Bradycardia, unspecified: Secondary | ICD-10-CM | POA: Diagnosis not present

## 2022-06-03 DIAGNOSIS — N3000 Acute cystitis without hematuria: Secondary | ICD-10-CM

## 2022-06-03 LAB — CBC
HCT: 31.6 % — ABNORMAL LOW (ref 36.0–46.0)
Hemoglobin: 10.3 g/dL — ABNORMAL LOW (ref 12.0–15.0)
MCH: 31.7 pg (ref 26.0–34.0)
MCHC: 32.6 g/dL (ref 30.0–36.0)
MCV: 97.2 fL (ref 80.0–100.0)
Platelets: 167 10*3/uL (ref 150–400)
RBC: 3.25 MIL/uL — ABNORMAL LOW (ref 3.87–5.11)
RDW: 13.7 % (ref 11.5–15.5)
WBC: 6.5 10*3/uL (ref 4.0–10.5)
nRBC: 0 % (ref 0.0–0.2)

## 2022-06-03 LAB — BASIC METABOLIC PANEL
Anion gap: 7 (ref 5–15)
BUN: 8 mg/dL (ref 8–23)
CO2: 22 mmol/L (ref 22–32)
Calcium: 8.3 mg/dL — ABNORMAL LOW (ref 8.9–10.3)
Chloride: 111 mmol/L (ref 98–111)
Creatinine, Ser: 0.87 mg/dL (ref 0.44–1.00)
GFR, Estimated: >60 mL/min (ref >60)
Glucose, Bld: 178 mg/dL — ABNORMAL HIGH (ref 70–99)
Potassium: 3.4 mmol/L — ABNORMAL LOW (ref 3.5–5.1)
Sodium: 140 mmol/L (ref 135–145)

## 2022-06-03 LAB — TSH: TSH: 1.689 u[IU]/mL (ref 0.350–4.500)

## 2022-06-03 LAB — URINALYSIS, ROUTINE W REFLEX MICROSCOPIC
Bilirubin Urine: NEGATIVE
Glucose, UA: NEGATIVE mg/dL
Hgb urine dipstick: NEGATIVE
Ketones, ur: NEGATIVE mg/dL
Nitrite: NEGATIVE
Protein, ur: NEGATIVE mg/dL
Specific Gravity, Urine: 1.014 (ref 1.005–1.030)
pH: 5 (ref 5.0–8.0)

## 2022-06-03 LAB — HEMOGLOBIN A1C
Hgb A1c MFr Bld: 5.5 % (ref 4.8–5.6)
Mean Plasma Glucose: 111.15 mg/dL

## 2022-06-03 LAB — POC OCCULT BLOOD, ED: Fecal Occult Bld: POSITIVE — AB

## 2022-06-03 LAB — PHOSPHORUS: Phosphorus: 2.2 mg/dL — ABNORMAL LOW (ref 2.5–4.6)

## 2022-06-03 LAB — VITAMIN B12: Vitamin B-12: 422 pg/mL (ref 180–914)

## 2022-06-03 LAB — LACTIC ACID, PLASMA: Lactic Acid, Venous: 1.5 mmol/L (ref 0.5–1.9)

## 2022-06-03 LAB — CBG MONITORING, ED: Glucose-Capillary: 189 mg/dL — ABNORMAL HIGH (ref 70–99)

## 2022-06-03 LAB — MAGNESIUM: Magnesium: 1.5 mg/dL — ABNORMAL LOW (ref 1.7–2.4)

## 2022-06-03 MED ORDER — MOMETASONE FURO-FORMOTEROL FUM 100-5 MCG/ACT IN AERO
2.0000 | INHALATION_SPRAY | Freq: Two times a day (BID) | RESPIRATORY_TRACT | Status: DC
  Administered 2022-06-03 – 2022-06-04 (×2): 2 via RESPIRATORY_TRACT
  Filled 2022-06-03: qty 8.8

## 2022-06-03 MED ORDER — CALCIUM GLUCONATE-NACL 2-0.675 GM/100ML-% IV SOLN
2.0000 g | Freq: Once | INTRAVENOUS | Status: AC
Start: 2022-06-03 — End: 2022-06-03
  Administered 2022-06-03: 2000 mg via INTRAVENOUS
  Filled 2022-06-03: qty 100

## 2022-06-03 MED ORDER — MAGNESIUM SULFATE 2 GM/50ML IV SOLN
2.0000 g | Freq: Once | INTRAVENOUS | Status: AC
  Administered 2022-06-03: 2 g via INTRAVENOUS
  Filled 2022-06-03: qty 50

## 2022-06-03 MED ORDER — ONDANSETRON HCL 4 MG PO TABS: 4.0000 mg | ORAL_TABLET | Freq: Four times a day (QID) | ORAL | Status: DC | PRN

## 2022-06-03 MED ORDER — SODIUM CHLORIDE 0.9 % IV SOLN: 1.0000 g | INTRAVENOUS | Status: DC

## 2022-06-03 MED ORDER — GABAPENTIN 100 MG PO CAPS
100.0000 mg | ORAL_CAPSULE | Freq: Every day | ORAL | Status: DC
  Administered 2022-06-03: 100 mg via ORAL
  Filled 2022-06-03: qty 1

## 2022-06-03 MED ORDER — ALBUTEROL SULFATE (2.5 MG/3ML) 0.083% IN NEBU: 2.5000 mg | INHALATION_SOLUTION | Freq: Four times a day (QID) | RESPIRATORY_TRACT | Status: DC | PRN

## 2022-06-03 MED ORDER — SODIUM CHLORIDE 0.9 % IV SOLN: INTRAVENOUS | Status: DC

## 2022-06-03 MED ORDER — K PHOS MONO-SOD PHOS DI & MONO 155-852-130 MG PO TABS
500.0000 mg | ORAL_TABLET | Freq: Two times a day (BID) | ORAL | Status: AC
  Administered 2022-06-03: 500 mg via ORAL
  Filled 2022-06-03 (×2): qty 2

## 2022-06-03 MED ORDER — SODIUM CHLORIDE 0.9% FLUSH
3.0000 mL | Freq: Two times a day (BID) | INTRAVENOUS | Status: DC
  Administered 2022-06-03 (×2): 3 mL via INTRAVENOUS

## 2022-06-03 MED ORDER — LORAZEPAM 1 MG PO TABS: 1.0000 mg | ORAL_TABLET | Freq: Three times a day (TID) | ORAL | Status: DC | PRN

## 2022-06-03 MED ORDER — ACETAMINOPHEN 325 MG PO TABS: 650.0000 mg | ORAL_TABLET | Freq: Four times a day (QID) | ORAL | Status: DC | PRN

## 2022-06-03 MED ORDER — ONDANSETRON HCL 4 MG/2ML IJ SOLN: 4.0000 mg | Freq: Four times a day (QID) | INTRAMUSCULAR | Status: DC | PRN

## 2022-06-03 MED ORDER — SODIUM CHLORIDE 0.9 % IV BOLUS
1000.0000 mL | Freq: Once | INTRAVENOUS | Status: AC
  Administered 2022-06-03: 1000 mL via INTRAVENOUS

## 2022-06-03 MED ORDER — ACETAMINOPHEN 650 MG RE SUPP: 650.0000 mg | Freq: Four times a day (QID) | RECTAL | Status: DC | PRN

## 2022-06-03 MED ORDER — SODIUM CHLORIDE 0.9 % IV SOLN
1.0000 g | Freq: Once | INTRAVENOUS | Status: AC
  Administered 2022-06-03: 1 g via INTRAVENOUS
  Filled 2022-06-03: qty 10

## 2022-06-03 MED ORDER — FLUTICASONE PROPIONATE 50 MCG/ACT NA SUSP
1.0000 | Freq: Every day | NASAL | Status: DC
  Administered 2022-06-03 – 2022-06-04 (×2): 1 via NASAL
  Filled 2022-06-03: qty 16

## 2022-06-03 MED ORDER — HYDROCORTISONE ACETATE 25 MG RE SUPP
25.0000 mg | Freq: Two times a day (BID) | RECTAL | Status: DC
  Administered 2022-06-03 – 2022-06-04 (×2): 25 mg via RECTAL
  Filled 2022-06-03 (×3): qty 1

## 2022-06-03 NOTE — ED Triage Notes (Signed)
Patient BIB [facility] from home with compliant of tongue numbness that was present on waking this morning, LKW at midnight. Patient also reports decrease fluid intake over the last two days but states she does drink a lot of soda. Patient is alert, oriented, ambulatory, and in no apparent distress at this time.

## 2022-06-03 NOTE — H&P (Addendum)
History and Physical    Patient: [name]:[id number] DOB: April 11, 1951 DOA: 06/03/2022 DOS: the patient was seen and examined on 06/03/2022 PCP: Laurey Morale, MD  Patient coming from: Home  Chief Complaint:  Chief Complaint  Patient presents with   Dizziness   HPI: [name] is a 71 y.o. female with medical history significant of hypertension, hyperlipidemia, asthma, depression, and  GERD presents with compliant dizziness.  When she woke up she felt fine, but after eating breakfast started feeling dizzy and lightheaded.  During this time she reported that her tongue felt big and like she did not have any saliva present.  She feels like she has been eating and drinking like normal, and had not been outside in the heat recently.  She had a colonoscopy with Dr. Tarri Glenn 2 months ago which revealed polyps, and since that time she reports she has had blood present on her stools from hemorrhoids.  Patient reports having 1 bowel movement per day on average.  She normally would drink more juice in the morning with breakfast soda throughout the day and have a [employer] mixed drink at night.  To her knowledge she has not had any fevers, dysuria, abdominal pain, nausea, or vomiting.  On admission to the emergency department patient was seen to be afebrile with blood pressure 91/54-110/68, and all other vital signs maintained.  Labs significant for hemoglobin 10.3, potassium 3.4 magnesium 1.5, and calcium 8.3.  Urinalysis positive for large leukocytes, few bacteria, and 21-50 WBCs.  Rectal exam was performed by the ED provider which noted hemorrhoids present without melena report.  Stool guaiacs noted to be positive.  Patient has been given Rocephin IV, 2 g of magnesium sulfate, and 1 L normal saline IV fluids.  Review of Systems: As mentioned in the history of present illness. All other systems reviewed and are negative. Past Medical History:  Diagnosis Date   Allergy    Asthma    sees  Dr. Baird Lyons   Colon polyp 2007   Depression    Diverticulosis    GERD (gastroesophageal reflux disease)    Gynecological examination    sees Dr. Paula Compton   Hyperlipemia    Hypertension    Past Surgical History:  Procedure Laterality Date   CARPAL TUNNEL RELEASE Right 11/09/1978   COLONOSCOPY  06/17/2021   per Dr. Tarri Glenn, adenimatous polyps, repeat in 3 yrs   [place]  11/10/2003   KNEE SURGERY     Social History:  reports that she has never smoked. She has never used smokeless tobacco. She reports current alcohol use. She reports that she does not use drugs.  Allergies  Allergen Reactions   Doxycycline     REACTION: jittery    Family History  Problem Relation Age of Onset   Colon cancer Mother 42   Asthma Mother    Colon cancer Father 32   Liver cancer Father    Diabetes Sister    Throat cancer Brother    Colon cancer Maternal Aunt        not sure age of onset   Colon cancer Maternal Aunt        not sure age of onset   Pancreatic cancer Neg Hx    Rectal cancer Neg Hx    Stomach cancer Neg Hx    Allergic rhinitis Neg Hx    Angioedema Neg Hx    Eczema Neg Hx    Colon polyps Neg Hx  Esophageal cancer Neg Hx     Prior to Admission medications   Medication Sig Start Date End Date Taking? Authorizing Provider  terbinafine (LAMISIL) 250 MG tablet TAKE 1 TABLET (250 MG TOTAL) BY MOUTH DAILY. 03/30/22   Laurey Morale, MD  acetaminophen (TYLENOL) 500 MG tablet Take 1 tablet (500 mg total) by mouth every 6 (six) hours as needed. 02/04/22   Wynona Dove A, DO  albuterol (PROAIR HFA) 108 (90 Base) MCG/ACT inhaler INHALE 2 PUFFS INTO THE LUNGS EVERY 6 HOURS AS NEEDED FOR WHEEZING. 03/09/16   Deneise Lever, MD  amoxicillin-clavulanate (AUGMENTIN) 875-125 MG tablet Take 1 tablet by mouth 2 (two) times daily. 02/26/22   Lucretia Kern, DO  aspirin 81 MG tablet Take 81 mg by mouth daily.    [provider]  benzonatate (TESSALON  PERLES) 100 MG capsule Take 1 capsule (100 mg total) by mouth 3 (three) times daily as needed. 02/26/22   Lucretia Kern, DO  budesonide-formoterol (SYMBICORT) 80-4.5 MCG/ACT inhaler 2 puffs then rinse mouth, twice daily maintenance inhaler 03/09/16   Baird Lyons D, MD  EPINEPHrine 0.3 mg/0.3 mL IJ SOAJ injection Use as directed for severe allergic reaction 12/03/16   Padgett, Rae Halsted, MD  Flaxseed, Linseed, (FLAX SEED OIL) 1000 MG CAPS Take 1 capsule by mouth daily.    [provider]  fluticasone (FLONASE) 50 MCG/ACT nasal spray USE 1 SPRAY IN EACH NOSTRIL TWICE DAILY 12/02/21   Laurey Morale, MD  gabapentin (NEURONTIN) 100 MG capsule Take 1 capsule (100 mg total) by mouth at bedtime. 02/24/22   Laurey Morale, MD  HYDROcodone bit-homatropine (HYCODAN) 5-1.5 MG/5ML syrup Take 5 mLs by mouth every 4 (four) hours as needed for cough. 08/22/21   Laurey Morale, MD  ketoconazole (NIZORAL) 2 % cream Apply 1 application topically daily. 05/08/20   Laurey Morale, MD  lidocaine (LIDODERM) 5 % Place 1 patch onto the skin daily as needed. Remove & Discard patch within 12 hours or as directed by MD 02/04/22   Jeanell Sparrow, DO  lisinopril-hydrochlorothiazide (ZESTORETIC) 20-12.5 MG tablet TAKE 1 TABLET EVERY DAY 05/28/22   Laurey Morale, MD  LORazepam (ATIVAN) 1 MG tablet TAKE 1 TABLET EVERY 8 HOURS AS NEEDED FOR ANXIETY 12/02/21   Laurey Morale, MD  NON FORMULARY Allergy vaccine. Once a week    [provider]  nystatin (MYCOSTATIN) 100000 UNIT/ML suspension Take 5 mLs (500,000 Units total) by mouth 4 (four) times daily. 09/23/21   Laurey Morale, MD  omeprazole (PRILOSEC) 20 MG capsule TAKE 1 CAPSULE EVERY DAY 05/28/22   Laurey Morale, MD  potassium chloride SA (KLOR-CON M) 20 MEQ tablet TAKE 1 TABLET EVERY DAY 05/28/22   Laurey Morale, MD  pyridOXINE (VITAMIN B-6) 100 MG tablet Take 100 mg by mouth daily.    [provider]  traMADol (ULTRAM) 50 MG tablet Take 1 tablet (50  mg total) by mouth every 6 (six) hours as needed. 02/04/22   Jeanell Sparrow, DO  vitamin B-12 (CYANOCOBALAMIN) 100 MCG tablet Take 100 mcg by mouth daily.    [provider]    Physical Exam: Vitals:   06/03/22 1115 06/03/22 1124 06/03/22 1352  BP: (!) 91/54  110/68  Pulse: 67  62  Resp: 14  18  Temp:  97.7 F (36.5 C)   TempSrc:  Oral   SpO2: 99%  98%   Constitutional: Elderly female currently in no acute distress  Eyes: PERRL, lids and conjunctivae normal ENMT: Mucous membranes are moist. Posterior pharynx clear of any exudate or lesions. Edentulous. Neck: normal, supple,  Respiratory: clear to auscultation bilaterally, no wheezing, no crackles. Normal respiratory effort. No accessory muscle use.  Cardiovascular: Regular rate and rhythm, no murmurs / rubs / gallops. No significant extremity edema. 2+ pedal pulses.  Abdomen: no tenderness, no masses palpated. No hepatosplenomegaly. Bowel sounds positive.  Musculoskeletal: no clubbing / cyanosis. No joint deformity upper and lower extremities. Good ROM, no contractures. Normal muscle tone.  Skin: no rashes, lesions, ulcers. No induration Neurologic: CN 2-12 grossly intact. Sensation intact, DTR normal. Strength 5/5 in all 4.  Psychiatric: Normal judgment and insight. Alert and oriented x 3. Normal mood.   Data Reviewed:  EKG reveals a junctional rhythm at 66 bpm.  Reviewed labs imaging and pertinent records as noted above in HPI  Assessment and Plan: Lightheadedness Acute.  Patient reported feeling lightheaded after eating breakfast this morning.  Denies any recent nausea, vomiting, diarrhea symptoms.  She has been drinking fluids like she normally does without recent changes.  Question if patient's drop in hemoglobin and/or diuretic or the cause of patient's symptoms. -Admit to medical telemetry bed -Check orthostatic vital signs -Check TSH -Follow-up telemetry overnight  Rectal bleeding hemorrhoids Acute.  Patient  reports having blood present on her stools since having colonoscopy 2 months ago where polyps were removed by Dr. Tarri Glenn.  Polyps were reported to be benign.  Question of hemorrhoids versus recently removed polyps as cause for rectal bleeding -Monitor intake and output -Hold aspirin -Hydrocortisone suppositories twice daily -Formally consult [facility] if hemoglobin seem to continue dropping  Normocytic anemia Acute.  Hemoglobin 10.3 g/dL on admission which is acutely lower than previous baseline which appears to be within normal limits at around 12. -Type and screen for possible need of blood products -Continue to monitor H&H -Transfuse blood products as needed  Urinary tract infection Acute.  Urinalysis noted large leukocytes, few bacteria, 6-10 RBC/hpf, 6-10 squamous epithelials/LPF, and -Admit to medical telemetry -Follow-up urine culture -Continue empiric antibiotics of Rocephin IV  Hypokalemia hypomagnesemia hypophosphatemia hypocalcemia Acute.  Labs reveal potassium 3.4, magnesium 1.5, phosphorus 2.2, and calcium 8.3.  Patient has been given magnesium sulfate 2 g IV.  Some of the electrolyte abnormalities may be related to diuretics. -Give K-Phos Neutral 500 mg p.o. twice daily x2 dose -Give 2 g of calcium gluconate IV -Continue to monitor and replace as needed  Essential hypertension On admission blood pressures were noted to be soft as low as 91/54.  Home blood pressure regimen includes lisinopril-hydrochlorothiazide 20-12.5 mg daily. -Held home blood pressure regimen  Hyperglycemia prediabetes Acute.  Initial glucose elevated at 178.   Last available hemoglobin A1c 5.8 on 10/20/2021. -Check hemoglobin A1c  Anxiety disorder -Continue Ativan as needed  Mild intermittent asthma Patient without wheezing or rhonchi appreciated on physical exam. -Continue pharmacy substitution of home inhaler  -Albuterol nebs as needed for shortness of breath still using  DVT  prophylaxis: SCDs Advance Care Planning:   Code Status: Full Code   Consults: Neurology  Family Communication: Family including husband and granddaughter updated Severity of Illness: The appropriate patient status for this patient is INPATIENT. Inpatient status is judged to be reasonable and necessary in order to provide the required intensity of service to ensure the patient's safety. The patient's presenting symptoms, physical exam findings, and initial radiographic and laboratory data in the context of their chronic comorbidities is felt to place  them at high risk for further clinical deterioration. Furthermore, it is not anticipated that the patient will be medically stable for discharge from the hospital within 2 midnights of admission.   * I certify that at the point of admission it is my clinical judgment that the patient will require inpatient hospital care spanning beyond 2 midnights from the point of admission due to high intensity of service, high risk for further deterioration and high frequency of surveillance required.*  Author: Norval Morton, MD 06/03/2022 2:06 PM  For on call review [email or web address].

## 2022-06-03 NOTE — ED Provider Notes (Signed)
[facility] EMERGENCY DEPARTMENT Provider Note   CSN: [id number] Arrival date & time: 06/03/22  1059     History  Chief Complaint  Patient presents with   Numbness    [name] is a 71 y.o. female.  The history is provided by the patient and medical records. No language interpreter was used.    71 year old female significant history of hypertension, depression, asthma brought here via EMS from home for evaluation of tongue numbness.  Patient report this morning she woke up feeling numbness around her tongue and her mouth.  She also felt a bit tired and weak.  EMS was contacted and patient was brought here.  EMS report on initial evaluation, patient has a CBG of 240 and blood pressure was soft at 95 systolic.  Patient received IV fluid on arrival and she reported symptoms did improve.  She still endorse some dryness in her mouth.  She does not endorse any headache, vision changes, confusion, focal numbness or focal weakness, chest pain, shortness of breath, abdominal pain.  She denies any recent medication changes.  She admits that she does not like to drink water and drinks mostly coffee and sodas but this is normal for her.  She was told that she has borderline diabetes.  She denies feeling anxious or depressed.  She does not endorse polydipsia but does endorse increased urinary frequency.  Home Medications Prior to Admission medications   Medication Sig Start Date End Date Taking? Authorizing Provider  terbinafine (LAMISIL) 250 MG tablet TAKE 1 TABLET (250 MG TOTAL) BY MOUTH DAILY. 03/30/22   Laurey Morale, MD  acetaminophen (TYLENOL) 500 MG tablet Take 1 tablet (500 mg total) by mouth every 6 (six) hours as needed. 02/04/22   Wynona Dove A, DO  albuterol (PROAIR HFA) 108 (90 Base) MCG/ACT inhaler INHALE 2 PUFFS INTO THE LUNGS EVERY 6 HOURS AS NEEDED FOR WHEEZING. 03/09/16   Deneise Lever, MD  amoxicillin-clavulanate (AUGMENTIN) 875-125 MG tablet Take 1  tablet by mouth 2 (two) times daily. 02/26/22   Lucretia Kern, DO  aspirin 81 MG tablet Take 81 mg by mouth daily.    [provider]  benzonatate (TESSALON PERLES) 100 MG capsule Take 1 capsule (100 mg total) by mouth 3 (three) times daily as needed. 02/26/22   Lucretia Kern, DO  budesonide-formoterol (SYMBICORT) 80-4.5 MCG/ACT inhaler 2 puffs then rinse mouth, twice daily maintenance inhaler 03/09/16   Baird Lyons D, MD  EPINEPHrine 0.3 mg/0.3 mL IJ SOAJ injection Use as directed for severe allergic reaction 12/03/16   Padgett, Rae Halsted, MD  Flaxseed, Linseed, (FLAX SEED OIL) 1000 MG CAPS Take 1 capsule by mouth daily.    [provider]  fluticasone (FLONASE) 50 MCG/ACT nasal spray USE 1 SPRAY IN EACH NOSTRIL TWICE DAILY 12/02/21   Laurey Morale, MD  gabapentin (NEURONTIN) 100 MG capsule Take 1 capsule (100 mg total) by mouth at bedtime. 02/24/22   Laurey Morale, MD  HYDROcodone bit-homatropine (HYCODAN) 5-1.5 MG/5ML syrup Take 5 mLs by mouth every 4 (four) hours as needed for cough. 08/22/21   Laurey Morale, MD  ketoconazole (NIZORAL) 2 % cream Apply 1 application topically daily. 05/08/20   Laurey Morale, MD  lidocaine (LIDODERM) 5 % Place 1 patch onto the skin daily as needed. Remove & Discard patch within 12 hours or as directed by MD 02/04/22   Jeanell Sparrow, DO  lisinopril-hydrochlorothiazide (ZESTORETIC) 20-12.5 MG tablet TAKE 1 TABLET  EVERY DAY 05/28/22   Laurey Morale, MD  LORazepam (ATIVAN) 1 MG tablet TAKE 1 TABLET EVERY 8 HOURS AS NEEDED FOR ANXIETY 12/02/21   Laurey Morale, MD  NON FORMULARY Allergy vaccine. Once a week    [provider]  nystatin (MYCOSTATIN) 100000 UNIT/ML suspension Take 5 mLs (500,000 Units total) by mouth 4 (four) times daily. 09/23/21   Laurey Morale, MD  omeprazole (PRILOSEC) 20 MG capsule TAKE 1 CAPSULE EVERY DAY 05/28/22   Laurey Morale, MD  potassium chloride SA (KLOR-CON M) 20 MEQ tablet TAKE 1 TABLET EVERY DAY 05/28/22    Laurey Morale, MD  pyridOXINE (VITAMIN B-6) 100 MG tablet Take 100 mg by mouth daily.    [provider]  traMADol (ULTRAM) 50 MG tablet Take 1 tablet (50 mg total) by mouth every 6 (six) hours as needed. 02/04/22   Jeanell Sparrow, DO  vitamin B-12 (CYANOCOBALAMIN) 100 MCG tablet Take 100 mcg by mouth daily.    [provider]      Allergies    Doxycycline    Review of Systems   Review of Systems  All other systems reviewed and are negative.   Physical Exam Updated Vital Signs BP (!) 91/54   Pulse 67   Temp 97.7 F (36.5 C) (Oral)   Resp 14   SpO2 99%  Physical Exam Vitals and nursing note reviewed.  Constitutional:      General: She is not in acute distress.    Appearance: She is well-developed.  HENT:     Head: Atraumatic.  Eyes:     Conjunctiva/sclera: Conjunctivae normal.  Pulmonary:     Effort: Pulmonary effort is normal.  Genitourinary:    Comments: Chaperone present during exam.  Nonthrombosed external hemorrhoid palpated.  No rectal tone, no obvious mass, local color stool on glove. Musculoskeletal:     Cervical back: Neck supple.  Skin:    Findings: No rash.  Neurological:     Mental Status: She is alert and oriented to person, place, and time.     GCS: GCS eye subscore is 4. GCS verbal subscore is 5. GCS motor subscore is 6.     Cranial Nerves: Cranial nerves 2-12 are intact. No dysarthria or facial asymmetry.     Sensory: Sensation is intact.     Motor: Motor function is intact.     Coordination: Coordination is intact. Finger-Nose-Finger Test and Heel to [facility] [employer] Test normal.  Psychiatric:        Mood and Affect: Mood normal.     ED Results / Procedures / Treatments   Labs (all labs ordered are listed, but only abnormal results are displayed) Labs Reviewed  BASIC METABOLIC PANEL - Abnormal; Notable for the following components:      Result Value   Potassium 3.4 (*)    Glucose, Bld 178 (*)    Calcium 8.3 (*)    All other components  within normal limits  CBC - Abnormal; Notable for the following components:   RBC 3.25 (*)    Hemoglobin 10.3 (*)    HCT 31.6 (*)    All other components within normal limits  URINALYSIS, ROUTINE W REFLEX MICROSCOPIC - Abnormal; Notable for the following components:   APPearance HAZY (*)    Leukocytes,Ua LARGE (*)    Bacteria, UA FEW (*)    Non Squamous Epithelial 0-5 (*)    All other components within normal limits  MAGNESIUM - Abnormal; Notable for the following  components:   Magnesium 1.5 (*)    All other components within normal limits  PHOSPHORUS - Abnormal; Notable for the following components:   Phosphorus 2.2 (*)    All other components within normal limits  CBG MONITORING, ED - Abnormal; Notable for the following components:   Glucose-Capillary 189 (*)    All other components within normal limits  POC OCCULT BLOOD, ED - Abnormal; Notable for the following components:   Fecal Occult Bld POSITIVE (*)    All other components within normal limits  URINE CULTURE  VITAMIN B12  LACTIC ACID, PLASMA  LACTIC ACID, PLASMA    EKG EKG Interpretation  Date/Time:  Wednesday June 03 2022 11:09:40 EDT Ventricular Rate:  66 PR Interval:    QRS Duration: 96 QT Interval:  462 QTC Calculation: 485 R Axis:   14 Text Interpretation: Junctional rhythm Low voltage, extremity and precordial leads No old tracing to compare Confirmed by Noemi Chapel [phone]) on 06/03/2022 11:20:54 AM  Radiology No results found.  Procedures Procedures    Medications Ordered in ED Medications  magnesium sulfate IVPB 2 g 50 mL (has no administration in time range)  cefTRIAXone (ROCEPHIN) 1 g in sodium chloride 0.9 % 100 mL IVPB (has no administration in time range)  sodium chloride 0.9 % bolus 1,000 mL (1,000 mLs Intravenous New Bag/Given 06/03/22 1129)    ED Course/ Medical Decision Making/ A&P                           Medical Decision Making Amount and/or Complexity of Data Reviewed Labs:  ordered.  Risk Prescription drug management. Decision regarding hospitalization.   BP (!) 91/54   Pulse 67   Temp 97.7 F (36.5 C) (Oral)   Resp 14   SpO2 99%   11:16 AM This is a 71 year old female who was told that she has borderline diabetes.  Patient is here with complaints of numbness sensation around her tongue and around her mouth that started this morning.  She felt fine last night when she went to bed.  She does not endorse any headache, vision changes, confusion, focal weakness, and denies any active pain.  She does admits that she does not drink much water but does drink plenty of sodas.  When EMS arrived, they noted that her blood pressure was low at 95 systolic and patient received 1 L of IV fluid prior to arrival.  Her blood sugar was elevated at 240.  On exam, patient is resting in the bed appears to be in no acute discomfort.  Mouth is dry however tongue sensation is intact.  Tongue with full range of motion.  No facial droop.  Patient without any focal neurodeficit on exam.  She is mentating appropriately.  She is moving all 4 extremities without difficulty with intact sensation.  12:49 PM Labs and EKG obtained and independently viewed interpreted by me.  EKG without concerning arrhythmia or ischemic changes.  Labs remarkable for hemoglobin of 10.3.  It is mildly decreased some prior value, will check Hemoccult.  Magnesium currently low at 1.5, will provide supplementation.  CBG is 189.  Patient still remains hypertensive at 91/54 despite receiving IV fluid in the ED.  She does not exhibit any symptoms to suggest an ongoing infection.  Doubt sepsis.  No recent medication changes to suggest medication induced hypotension.  Clinically patient appears dry however her renal function is within normal limit.  1:17 PM Due to the slight  drop in her hemoglobin, I discussed with the patient and patient states that she does have history of external hemorrhoid and does see blood  sometimes when she wipes.  Patient had a bowel movement today and states she did notice some blood when she wipes and she thinks this is coming from her hemorrhoid.  She denies noticing any melena.  1:48 PM Urinalysis shows large leukocyte esterase along with 21-50 WBC.  Since patient does endorse some urinary frequency and with this urinalysis result, will treat for UTI with Rocephin.  Urine culture sent.    1:59 PM Given low BP, and a source of infection will check lactic acid.  Couple with her age and comorbidity I will consult medicine for admission.   2:12 PM Appreciate consultation from [facility] hospitalist, Dr. Tamala Julian, who agrees to see and will admit patient for further care.  Patient made aware of plan and agrees with plan.  This patient presents to the ED for concern of numbness, this involves an extensive number of treatment options, and is a complaint that carries with it a high risk of complications and morbidity.  The differential diagnosis includes neuropathy, dehydration, electrolytes derangement, stroke, anemia, vitB12 deficiency  Co morbidities that complicate the patient evaluation HTN  Anxiety   Additional history obtained:  Additional history obtained from husband External records from outside source obtained and reviewed including EMR including prior labs and imaging  Lab Tests:  I Ordered, and personally interpreted labs.  The pertinent results include:  as above   Cardiac Monitoring:  The patient was maintained on a cardiac monitor.  I personally viewed and interpreted the cardiac monitored which showed an underlying rhythm of: NSR  Medicines ordered and prescription drug management:  I ordered medication including IVF  for hypotension Reevaluation of the patient after these medicines showed that the patient improved I have reviewed the patients home medicines and have made adjustments as needed  Test Considered: as above  Critical  Interventions: IVF  magnesium  Consultations Obtained:  I requested consultation with the hospitalist Dr. Tamala Julian,  and discussed lab and imaging findings as well as pertinent plan - they recommend: admission  Problem List / ED Course: weakness  UTI  Tongue numbness  hypotension  Reevaluation:  After the interventions noted above, I reevaluated the patient and found that they have :improved  Social Determinants of Health: lack of physical activity  Dispostion:  After consideration of the diagnostic results and the patients response to treatment, I feel that the patent would benefit from admission.         Final Clinical Impression(s) / ED Diagnoses Final diagnoses:  Acute lower UTI  Hypotension due to hypovolemia    Rx / DC Orders ED Discharge Orders     None         Domenic Moras, PA-C 06/05/22 1805    Noemi Chapel, MD 06/08/22 1155

## 2022-06-04 DIAGNOSIS — F419 Anxiety disorder, unspecified: Secondary | ICD-10-CM | POA: Diagnosis not present

## 2022-06-04 DIAGNOSIS — I10 Essential (primary) hypertension: Secondary | ICD-10-CM | POA: Diagnosis not present

## 2022-06-04 DIAGNOSIS — R42 Dizziness and giddiness: Secondary | ICD-10-CM

## 2022-06-04 LAB — BASIC METABOLIC PANEL
Anion gap: 6 (ref 5–15)
BUN: 5 mg/dL — ABNORMAL LOW (ref 8–23)
CO2: 24 mmol/L (ref 22–32)
Calcium: 8.8 mg/dL — ABNORMAL LOW (ref 8.9–10.3)
Chloride: 113 mmol/L — ABNORMAL HIGH (ref 98–111)
Creatinine, Ser: 0.8 mg/dL (ref 0.44–1.00)
GFR, Estimated: >60 mL/min (ref >60)
Glucose, Bld: 116 mg/dL — ABNORMAL HIGH (ref 70–99)
Potassium: 3.5 mmol/L (ref 3.5–5.1)
Sodium: 143 mmol/L (ref 135–145)

## 2022-06-04 LAB — CBC
HCT: 33.4 % — ABNORMAL LOW (ref 36.0–46.0)
Hemoglobin: 11 g/dL — ABNORMAL LOW (ref 12.0–15.0)
MCH: 31.7 pg (ref 26.0–34.0)
MCHC: 32.9 g/dL (ref 30.0–36.0)
MCV: 96.3 fL (ref 80.0–100.0)
Platelets: 173 10*3/uL (ref 150–400)
RBC: 3.47 MIL/uL — ABNORMAL LOW (ref 3.87–5.11)
RDW: 13.6 % (ref 11.5–15.5)
WBC: 9 10*3/uL (ref 4.0–10.5)
nRBC: 0 % (ref 0.0–0.2)

## 2022-06-04 LAB — TYPE AND SCREEN
ABO/RH(D): O POS
Antibody Screen: NEGATIVE

## 2022-06-04 LAB — MAGNESIUM: Magnesium: 2 mg/dL (ref 1.7–2.4)

## 2022-06-04 LAB — HIV ANTIBODY (ROUTINE TESTING W REFLEX): HIV Screen 4th Generation wRfx: NONREACTIVE

## 2022-06-04 LAB — ABO/RH: ABO/RH(D): O POS

## 2022-06-04 LAB — PHOSPHORUS: Phosphorus: 3.7 mg/dL (ref 2.5–4.6)

## 2022-06-04 MED ORDER — CEPHALEXIN 500 MG PO CAPS: 500.0000 mg | ORAL_CAPSULE | Freq: Three times a day (TID) | ORAL | 0 refills | Status: AC

## 2022-06-04 NOTE — Assessment & Plan Note (Signed)
BMI 37 with HTN, hyperlipidemia

## 2022-06-04 NOTE — Care Management CC44 (Signed)
Condition Code 44 Documentation Completed  Patient Details  Name: [name] MRN: [id number] Date of Birth: January 24, 1951   Condition Code 44 given:  Yes Patient signature on Condition Code 44 notice:  Yes Documentation of 2 MD's agreement:  Yes Code 44 added to claim:  Yes  Verbal Permission to sign  Verdell Carmine, RN 06/04/2022, 10:00 AM

## 2022-06-04 NOTE — Hospital Course (Signed)
Mrs. Lagasse is a 71 y.o. F with HTN, asthma, and obesity who presented with acute dizziness.  In the ER, found to have hypokalemia, hypomagnesemia, and hypophosphatemia.  Admitted for fluids.

## 2022-06-04 NOTE — Progress Notes (Signed)
Pt discharged home in stable condition 

## 2022-06-04 NOTE — Discharge Summary (Signed)
Physician Discharge Summary   Patient: [name] MRN: [id number] DOB: November 08, 1951  Admit date:     06/03/2022  Discharge date: 06/04/22  Discharge Physician: Edwin Dada   PCP: Laurey Morale, MD     Recommendations at discharge:  Follow up with PCP in 1 week Dr. Sarajane Jews: Please review urine culture results     Discharge Diagnoses: Principal Problem:   Lightheadedness Active Problems:   Hypokalemia   Hypomagnesemia   Hypocalcemia   Hypophosphatemia   UTI (urinary tract infection)   Hemorrhoids   Normocytic anemia   Essential hypertension   Hyperglycemia   Anxiety disorder   Mild intermittent asthma   Morbid obesity [facility])      Hospital Course: Mrs. [name] is a 71 y.o. F with HTN, asthma, and obesity who presented with acute dizziness.  In the ER, found to have hypokalemia, hypomagnesemia, and hypophosphatemia.  Admitted for fluids.     Dizziness, likely due to hypokalemia, hypomagnesemia, and UTI Patient admitted and the fluids were started.  In the morning she felt well, orthostatics were normal, she ambulated without symptoms, her oral intake was good and her mentation was at baseline.  She had no focal neurological deficits and she was discharged with close follow up with PCP.   Morbid obesity ([place]) BMI 37 with HTN, hyperlipidemia            [facility] Controlled Substances Registry was reviewed for this patient prior to discharge.      Disposition: Home   DISCHARGE MEDICATION: Allergies as of 06/04/2022       Reactions   Doxycycline Other (See Comments)   Jittery         Medication List     STOP taking these medications    benzonatate 100 MG capsule Commonly known as: Tessalon Perles   traMADol 50 MG tablet Commonly known as: ULTRAM       TAKE these medications    albuterol 108 (90 Base) MCG/ACT inhaler Commonly known as: ProAir HFA INHALE 2 PUFFS INTO THE LUNGS EVERY 6 HOURS AS NEEDED FOR  WHEEZING. What changed:  how much to take how to take this when to take this reasons to take this additional instructions   aspirin 81 MG tablet Take 81 mg by mouth daily.   budesonide-formoterol 80-4.5 MCG/ACT inhaler Commonly known as: Symbicort 2 puffs then rinse mouth, twice daily maintenance inhaler What changed:  how much to take how to take this when to take this additional instructions   cephALEXin 500 MG capsule Commonly known as: KEFLEX Take 1 capsule (500 mg total) by mouth 3 (three) times daily for 10 days.   EPINEPHrine 0.3 mg/0.3 mL Soaj injection Commonly known as: EPI-PEN Use as directed for severe allergic reaction   Flax Seed Oil 1000 MG Caps Take 1 capsule by mouth daily.   fluticasone 50 MCG/ACT nasal spray Commonly known as: FLONASE USE 1 SPRAY IN EACH NOSTRIL TWICE DAILY What changed:  how much to take how to take this when to take this additional instructions   gabapentin 100 MG capsule Commonly known as: NEURONTIN Take 1 capsule (100 mg total) by mouth at bedtime.   lisinopril-hydrochlorothiazide 20-12.5 MG tablet Commonly known as: ZESTORETIC TAKE 1 TABLET EVERY DAY   LORazepam 1 MG tablet Commonly known as: ATIVAN TAKE 1 TABLET EVERY 8 HOURS AS NEEDED FOR ANXIETY What changed: See the new instructions.   omeprazole 20 MG capsule Commonly known as: PRILOSEC TAKE 1 CAPSULE EVERY  DAY   potassium chloride SA 20 MEQ tablet Commonly known as: KLOR-CON M TAKE 1 TABLET EVERY DAY   pyridOXINE 100 MG tablet Commonly known as: VITAMIN B6 Take 100 mg by mouth daily.   vitamin B-12 100 MCG tablet Commonly known as: CYANOCOBALAMIN Take 100 mcg by mouth daily.        Follow-up Information     Laurey Morale, MD. Schedule an appointment as soon as possible for a visit in 1 week(s).   Specialty: Family Medicine Contact information: [place] [address] [phone]                 Discharge  Instructions     Discharge instructions   Complete by: As directed    Continue your home medicines Continue your lisinopril-HCTZ and potassium supplement Continue your daily inhaler, continue aspirin.  You should probably stop taking lorazepam, as this is associated with dizziness, falls, and memory loss in the elderly.  You may have a UTI (bladder infection), so take the antibiotic cephalexin 500 mg three times daily for the next 3 days then stop  Go see Dr. Sarajane Jews in 1 week   Increase activity slowly   Complete by: As directed        Discharge Exam: BP (!) 156/66 (BP Location: Left Arm)   Pulse 60   Temp 98.4 F (36.9 C) (Oral)   Resp 18   SpO2 97%    General: Pt is alert, awake, not in acute distress Cardiovascular: RRR, nl S1-S2, no murmurs appreciated.   No LE edema.   Respiratory: Normal respiratory rate and rhythm.  CTAB without rales or wheezes. Abdominal: Abdomen soft and non-tender.  No distension or HSM.   Neuro/Psych: Cranial nerves III through XII intact, speech fluent, strength symmetric in upper and lower extremities.  Judgment and insight appear normal. Gait normal.   Condition at discharge: good  The results of significant diagnostics from this hospitalization (including imaging, microbiology, ancillary and laboratory) are listed below for reference.      Labs: CBC: Recent Labs  Lab 06/03/22 1126 06/04/22 0241  WBC 6.5 9.0  HGB 10.3* 11.0*  HCT 31.6* 33.4*  MCV 97.2 96.3  PLT 167 338   Basic Metabolic Panel: Recent Labs  Lab 06/03/22 1126 06/04/22 0241  NA 140 143  K 3.4* 3.5  CL 111 113*  CO2 22 24  GLUCOSE 178* 116*  BUN 8 5*  CREATININE 0.87 0.80  CALCIUM 8.3* 8.8*  MG 1.5* 2.0  PHOS 2.2* 3.7   Liver Function Tests: No results for input(s): "AST", "ALT", "ALKPHOS", "BILITOT", "PROT", "ALBUMIN" in the last 168 hours. CBG: Recent Labs  Lab 06/03/22 1111  GLUCAP 189*    Discharge time spent: approximately 35 minutes spent on  discharge counseling, evaluation of patient on day of discharge, and coordination of discharge planning with nursing, social work, pharmacy and case management  Signed: Edwin Dada, MD [facility] 06/04/2022

## 2022-06-04 NOTE — Care Management Obs Status (Signed)
MEDICARE OBSERVATION STATUS NOTIFICATION   Patient Details  Name: [name] MRN: [id number] Date of Birth: 11/14/1950   Medicare Observation Status Notification Given:  Yes  Verbal permission to sign  Verdell Carmine, RN 06/04/2022, 10:00 AM

## 2022-06-09 DIAGNOSIS — J3089 Other allergic rhinitis: Secondary | ICD-10-CM | POA: Diagnosis not present

## 2022-06-09 DIAGNOSIS — J3081 Allergic rhinitis due to animal (cat) (dog) hair and dander: Secondary | ICD-10-CM | POA: Diagnosis not present

## 2022-06-09 DIAGNOSIS — J301 Allergic rhinitis due to pollen: Secondary | ICD-10-CM | POA: Diagnosis not present

## 2022-06-16 ENCOUNTER — Encounter: Payer: Self-pay | Admitting: Family Medicine

## 2022-06-16 ENCOUNTER — Ambulatory Visit (INDEPENDENT_AMBULATORY_CARE_PROVIDER_SITE_OTHER): Payer: Medicare HMO | Admitting: Family Medicine

## 2022-06-16 VITALS — BP 118/68 | HR 78 | Temp 98.7°F | Wt 184.0 lb

## 2022-06-16 DIAGNOSIS — E878 Other disorders of electrolyte and fluid balance, not elsewhere classified: Secondary | ICD-10-CM | POA: Diagnosis not present

## 2022-06-16 DIAGNOSIS — N390 Urinary tract infection, site not specified: Secondary | ICD-10-CM

## 2022-06-16 DIAGNOSIS — H9313 Tinnitus, bilateral: Secondary | ICD-10-CM | POA: Diagnosis not present

## 2022-06-16 DIAGNOSIS — R42 Dizziness and giddiness: Secondary | ICD-10-CM

## 2022-06-16 DIAGNOSIS — H9319 Tinnitus, unspecified ear: Secondary | ICD-10-CM | POA: Insufficient documentation

## 2022-06-16 DIAGNOSIS — J3089 Other allergic rhinitis: Secondary | ICD-10-CM | POA: Diagnosis not present

## 2022-06-16 DIAGNOSIS — H9190 Unspecified hearing loss, unspecified ear: Secondary | ICD-10-CM | POA: Insufficient documentation

## 2022-06-16 DIAGNOSIS — J3081 Allergic rhinitis due to animal (cat) (dog) hair and dander: Secondary | ICD-10-CM | POA: Diagnosis not present

## 2022-06-16 DIAGNOSIS — J301 Allergic rhinitis due to pollen: Secondary | ICD-10-CM | POA: Diagnosis not present

## 2022-06-16 DIAGNOSIS — H9193 Unspecified hearing loss, bilateral: Secondary | ICD-10-CM | POA: Diagnosis not present

## 2022-06-16 LAB — POC URINALSYSI DIPSTICK (AUTOMATED)
Bilirubin, UA: NEGATIVE
Blood, UA: NEGATIVE
Glucose, UA: NEGATIVE
Ketones, UA: NEGATIVE
Leukocytes, UA: NEGATIVE
Nitrite, UA: NEGATIVE
Protein, UA: NEGATIVE
Spec Grav, UA: <=1.005 — AB (ref 1.010–1.025)
Urobilinogen, UA: 0.2 E.U./dL
pH, UA: 6.5 (ref 5.0–8.0)

## 2022-06-16 MED ORDER — MECLIZINE HCL 25 MG PO TABS: 25.0000 mg | ORAL_TABLET | ORAL | 2 refills | Status: DC | PRN

## 2022-06-16 NOTE — Addendum Note (Signed)
Addended by: Wyvonne Lenz on: 06/16/2022 11:54 AM   Modules accepted: Orders

## 2022-06-16 NOTE — Progress Notes (Signed)
   Subjective:    Patient ID: [name], female    DOB: 04-May-1951, 71 y.o.   MRN: [id number]  HPI Here to follow up a hospital stay from 06-03-22 to 06-04-22 for dizziness. When she presented her levels of potassium, magnesium, and phosphorous were low and she was a bit dehydrated. These were all corrected with IV fluids. She was also found to have a UTI, and she was treated with a course of Keflex. Unfortunately no urine culture was obtained. She never had any urinary symptoms. Since going home she has had 3 more spells of dizziness,. Which have been quite mild. She describes some hearing loss and some tinnitus in both ears. No ear pain or headaches.    Review of Systems  Constitutional: Negative.   Respiratory: Negative.    Cardiovascular: Negative.   Genitourinary: Negative.   Neurological:  Positive for dizziness. Negative for headaches.       Objective:   Physical Exam Constitutional:      Appearance: Normal appearance.  HENT:     Right Ear: Tympanic membrane, ear canal and external ear normal.     Left Ear: Tympanic membrane, ear canal and external ear normal.  Cardiovascular:     Rate and Rhythm: Normal rate and regular rhythm.     Pulses: Normal pulses.     Heart sounds: Normal heart sounds.  Pulmonary:     Effort: Pulmonary effort is normal.     Breath sounds: Normal breath sounds.  Neurological:     Mental Status: She is alert.   Her UA today is clear.         Assessment & Plan:  She was treated for dehydration and electrolyte imbalances, so she will begin drinking plenty of water and Powerade Zero every day. Her UTI has resolved. She has vertigo, hearing loss, and tinnitus so she may have developed Meniere's disease. We will refer her to ENT for these issues. We spent a total of (  35 ) minutes reviewing records and discussing these issues.  Alysia Penna, MD

## 2022-06-22 DIAGNOSIS — J3089 Other allergic rhinitis: Secondary | ICD-10-CM | POA: Diagnosis not present

## 2022-06-22 DIAGNOSIS — J301 Allergic rhinitis due to pollen: Secondary | ICD-10-CM | POA: Diagnosis not present

## 2022-06-22 DIAGNOSIS — J3081 Allergic rhinitis due to animal (cat) (dog) hair and dander: Secondary | ICD-10-CM | POA: Diagnosis not present

## 2022-06-26 ENCOUNTER — Other Ambulatory Visit: Payer: Self-pay

## 2022-06-26 ENCOUNTER — Telehealth: Payer: Self-pay | Admitting: Family Medicine

## 2022-06-26 DIAGNOSIS — G629 Polyneuropathy, unspecified: Secondary | ICD-10-CM

## 2022-06-26 MED ORDER — GABAPENTIN 100 MG PO CAPS: 100.0000 mg | ORAL_CAPSULE | Freq: Every day | ORAL | 2 refills | Status: DC

## 2022-06-26 NOTE — Telephone Encounter (Signed)
Refill sent to [facility].  

## 2022-06-26 NOTE — Telephone Encounter (Signed)
Pt is requesting a refill of: gabapentin (NEURONTIN) 100 MG capsule  LOV:  06/16/2022 (HFU)  Pt asked if we can send it to:  [facility], [place] [address] [place] Phone:  [phone]  Fax:  [phone]     Please advise.

## 2022-06-29 DIAGNOSIS — J301 Allergic rhinitis due to pollen: Secondary | ICD-10-CM | POA: Diagnosis not present

## 2022-06-29 DIAGNOSIS — J3089 Other allergic rhinitis: Secondary | ICD-10-CM | POA: Diagnosis not present

## 2022-06-29 DIAGNOSIS — J3081 Allergic rhinitis due to animal (cat) (dog) hair and dander: Secondary | ICD-10-CM | POA: Diagnosis not present

## 2022-07-06 DIAGNOSIS — J3089 Other allergic rhinitis: Secondary | ICD-10-CM | POA: Diagnosis not present

## 2022-07-06 DIAGNOSIS — J301 Allergic rhinitis due to pollen: Secondary | ICD-10-CM | POA: Diagnosis not present

## 2022-07-06 DIAGNOSIS — J3081 Allergic rhinitis due to animal (cat) (dog) hair and dander: Secondary | ICD-10-CM | POA: Diagnosis not present

## 2022-07-14 DIAGNOSIS — J301 Allergic rhinitis due to pollen: Secondary | ICD-10-CM | POA: Diagnosis not present

## 2022-07-14 DIAGNOSIS — J3089 Other allergic rhinitis: Secondary | ICD-10-CM | POA: Diagnosis not present

## 2022-07-14 DIAGNOSIS — J3081 Allergic rhinitis due to animal (cat) (dog) hair and dander: Secondary | ICD-10-CM | POA: Diagnosis not present

## 2022-07-28 DIAGNOSIS — J301 Allergic rhinitis due to pollen: Secondary | ICD-10-CM | POA: Diagnosis not present

## 2022-07-28 DIAGNOSIS — J3089 Other allergic rhinitis: Secondary | ICD-10-CM | POA: Diagnosis not present

## 2022-07-28 DIAGNOSIS — J3081 Allergic rhinitis due to animal (cat) (dog) hair and dander: Secondary | ICD-10-CM | POA: Diagnosis not present

## 2022-08-03 DIAGNOSIS — J3081 Allergic rhinitis due to animal (cat) (dog) hair and dander: Secondary | ICD-10-CM | POA: Diagnosis not present

## 2022-08-03 DIAGNOSIS — J3089 Other allergic rhinitis: Secondary | ICD-10-CM | POA: Diagnosis not present

## 2022-08-03 DIAGNOSIS — J301 Allergic rhinitis due to pollen: Secondary | ICD-10-CM | POA: Diagnosis not present

## 2022-08-10 DIAGNOSIS — J3081 Allergic rhinitis due to animal (cat) (dog) hair and dander: Secondary | ICD-10-CM | POA: Diagnosis not present

## 2022-08-10 DIAGNOSIS — J301 Allergic rhinitis due to pollen: Secondary | ICD-10-CM | POA: Diagnosis not present

## 2022-08-10 DIAGNOSIS — J3089 Other allergic rhinitis: Secondary | ICD-10-CM | POA: Diagnosis not present

## 2022-08-13 ENCOUNTER — Ambulatory Visit: Payer: Medicare HMO

## 2022-08-14 ENCOUNTER — Ambulatory Visit (INDEPENDENT_AMBULATORY_CARE_PROVIDER_SITE_OTHER): Payer: Medicare HMO

## 2022-08-14 DIAGNOSIS — Z23 Encounter for immunization: Secondary | ICD-10-CM | POA: Diagnosis not present

## 2022-08-17 DIAGNOSIS — J301 Allergic rhinitis due to pollen: Secondary | ICD-10-CM | POA: Diagnosis not present

## 2022-08-17 DIAGNOSIS — J3081 Allergic rhinitis due to animal (cat) (dog) hair and dander: Secondary | ICD-10-CM | POA: Diagnosis not present

## 2022-08-17 DIAGNOSIS — J3089 Other allergic rhinitis: Secondary | ICD-10-CM | POA: Diagnosis not present

## 2022-08-19 DIAGNOSIS — Z961 Presence of intraocular lens: Secondary | ICD-10-CM | POA: Diagnosis not present

## 2022-08-19 DIAGNOSIS — Z0100 Encounter for examination of eyes and vision without abnormal findings: Secondary | ICD-10-CM | POA: Diagnosis not present

## 2022-08-19 DIAGNOSIS — H04123 Dry eye syndrome of bilateral lacrimal glands: Secondary | ICD-10-CM | POA: Diagnosis not present

## 2022-08-19 DIAGNOSIS — H5213 Myopia, bilateral: Secondary | ICD-10-CM | POA: Diagnosis not present

## 2022-08-19 DIAGNOSIS — M25561 Pain in right knee: Secondary | ICD-10-CM | POA: Diagnosis not present

## 2022-08-19 DIAGNOSIS — H52223 Regular astigmatism, bilateral: Secondary | ICD-10-CM | POA: Diagnosis not present

## 2022-08-19 DIAGNOSIS — Z135 Encounter for screening for eye and ear disorders: Secondary | ICD-10-CM | POA: Diagnosis not present

## 2022-08-19 DIAGNOSIS — H524 Presbyopia: Secondary | ICD-10-CM | POA: Diagnosis not present

## 2022-08-24 DIAGNOSIS — J3089 Other allergic rhinitis: Secondary | ICD-10-CM | POA: Diagnosis not present

## 2022-08-24 DIAGNOSIS — J3081 Allergic rhinitis due to animal (cat) (dog) hair and dander: Secondary | ICD-10-CM | POA: Diagnosis not present

## 2022-08-24 DIAGNOSIS — J301 Allergic rhinitis due to pollen: Secondary | ICD-10-CM | POA: Diagnosis not present

## 2022-08-25 ENCOUNTER — Other Ambulatory Visit: Payer: Self-pay | Admitting: Family Medicine

## 2022-08-25 ENCOUNTER — Encounter: Payer: Self-pay | Admitting: Family Medicine

## 2022-08-25 DIAGNOSIS — G629 Polyneuropathy, unspecified: Secondary | ICD-10-CM

## 2022-08-25 NOTE — Telephone Encounter (Signed)
Yes I recommend she get both these shots. The Covid virus constantly changes and the older shots do not cover the strain that is going around now. The new Covid shot does cover for this strain. The RSV shot protects against a different virus that could cause a bad pneumonia

## 2022-08-31 DIAGNOSIS — J301 Allergic rhinitis due to pollen: Secondary | ICD-10-CM | POA: Diagnosis not present

## 2022-08-31 DIAGNOSIS — J3089 Other allergic rhinitis: Secondary | ICD-10-CM | POA: Diagnosis not present

## 2022-08-31 DIAGNOSIS — J3081 Allergic rhinitis due to animal (cat) (dog) hair and dander: Secondary | ICD-10-CM | POA: Diagnosis not present

## 2022-09-03 DIAGNOSIS — J3089 Other allergic rhinitis: Secondary | ICD-10-CM | POA: Diagnosis not present

## 2022-09-07 ENCOUNTER — Ambulatory Visit (INDEPENDENT_AMBULATORY_CARE_PROVIDER_SITE_OTHER): Payer: Medicare HMO

## 2022-09-07 VITALS — Ht 60.0 in | Wt 168.0 lb

## 2022-09-07 DIAGNOSIS — J301 Allergic rhinitis due to pollen: Secondary | ICD-10-CM | POA: Diagnosis not present

## 2022-09-07 DIAGNOSIS — J3089 Other allergic rhinitis: Secondary | ICD-10-CM | POA: Diagnosis not present

## 2022-09-07 DIAGNOSIS — J3081 Allergic rhinitis due to animal (cat) (dog) hair and dander: Secondary | ICD-10-CM | POA: Diagnosis not present

## 2022-09-07 DIAGNOSIS — Z0000 Encounter for general adult medical examination without abnormal findings: Secondary | ICD-10-CM

## 2022-09-07 NOTE — Patient Instructions (Signed)
[name] , Thank you for taking time to come for your Medicare Wellness Visit. I appreciate your ongoing commitment to your health goals. Please review the following plan we discussed and let me know if I can assist you in the future.   Screening recommendations/referrals: Colonoscopy: completed 06/17/2021, due 06/17/2024 Mammogram: scheduled for 10/2022 Bone Density: completed 06/09/2012 Recommended yearly ophthalmology/optometry visit for glaucoma screening and checkup Recommended yearly dental visit for hygiene and checkup  Vaccinations: Influenza vaccine: completed 08/14/2022 Pneumococcal vaccine: completed 03/10/2016 Tdap vaccine: completed 02/04/2022, due 02/05/2032 Shingles vaccine: completed    Covid-19: 12/19/2021, 01/04/2020, 12/14/2019  Advanced directives: Advance directive discussed with you today.   Conditions/risks identified: none  Next appointment: Follow up in one year for your annual wellness visit    Preventive Care 65 Years and Older, Female Preventive care refers to lifestyle choices and visits with your health care provider that can promote health and wellness. What does preventive care include? A yearly physical exam. This is also called an annual well check. Dental exams once or twice a year. Routine eye exams. Ask your health care provider how often you should have your eyes checked. Personal lifestyle choices, including: Daily care of your teeth and gums. Regular physical activity. Eating a healthy diet. Avoiding tobacco and drug use. Limiting alcohol use. Practicing safe sex. Taking low-dose aspirin every day. Taking vitamin and mineral supplements as recommended by your health care provider. What happens during an annual well check? The services and screenings done by your health care provider during your annual well check will depend on your age, overall health, lifestyle risk factors, and family history of disease. Counseling  Your health care provider  may ask you questions about your: Alcohol use. Tobacco use. Drug use. Emotional well-being. Home and relationship well-being. Sexual activity. Eating habits. History of falls. Memory and ability to understand (cognition). Work and work Statistician. Reproductive health. Screening  You may have the following tests or measurements: Height, weight, and BMI. Blood pressure. Lipid and cholesterol levels. These may be checked every 5 years, or more frequently if you are over 11 years old. Skin check. Lung cancer screening. You may have this screening every year starting at age 39 if you have a 30-pack-year history of smoking and currently smoke or have quit within the past 15 years. Fecal occult blood test (FOBT) of the stool. You may have this test every year starting at age 62. Flexible sigmoidoscopy or colonoscopy. You may have a sigmoidoscopy every 5 years or a colonoscopy every 10 years starting at age 39. Hepatitis C blood test. Hepatitis B blood test. Sexually transmitted disease (STD) testing. Diabetes screening. This is done by checking your blood sugar (glucose) after you have not eaten for a while (fasting). You may have this done every 1-3 years. Bone density scan. This is done to screen for osteoporosis. You may have this done starting at age 77. Mammogram. This may be done every 1-2 years. Talk to your health care provider about how often you should have regular mammograms. Talk with your health care provider about your test results, treatment options, and if necessary, the need for more tests. Vaccines  Your health care provider may recommend certain vaccines, such as: Influenza vaccine. This is recommended every year. Tetanus, diphtheria, and acellular pertussis (Tdap, Td) vaccine. You may need a Td booster every 10 years. Zoster vaccine. You may need this after age 77. Pneumococcal 13-valent conjugate (PCV13) vaccine. One dose is recommended after age 82. Pneumococcal  polysaccharide (PPSV23) vaccine. One dose is recommended after age 30. Talk to your health care provider about which screenings and vaccines you need and how often you need them. This information is not intended to replace advice given to you by your health care provider. Make sure you discuss any questions you have with your health care provider. Document Released: 11/22/2015 Document Revised: 07/15/2016 Document Reviewed: 08/27/2015 Elsevier Interactive Patient Education  2017 [employer] Prevention in the Home Falls can cause injuries. They can happen to people of all ages. There are many things you can do to make your home safe and to help prevent falls. What can I do on the outside of my home? Regularly fix the edges of walkways and driveways and fix any cracks. Remove anything that might make you trip as you walk through a door, such as a raised step or threshold. Trim any bushes or trees on the path to your home. Use bright outdoor lighting. Clear any walking paths of anything that might make someone trip, such as rocks or tools. Regularly check to see if handrails are loose or broken. Make sure that both sides of any steps have handrails. Any raised decks and porches should have guardrails on the edges. Have any leaves, snow, or ice cleared regularly. Use sand or salt on walking paths during winter. Clean up any spills in your garage right away. This includes oil or grease spills. What can I do in the bathroom? Use night lights. Install grab bars by the toilet and in the tub and shower. Do not use towel bars as grab bars. Use non-skid mats or decals in the tub or shower. If you need to sit down in the shower, use a plastic, non-slip stool. Keep the floor dry. Clean up any water that spills on the floor as soon as it happens. Remove soap buildup in the tub or shower regularly. Attach bath mats securely with double-sided non-slip rug tape. Do not have throw rugs and other  things on the floor that can make you trip. What can I do in the bedroom? Use night lights. Make sure that you have a light by your bed that is easy to reach. Do not use any sheets or blankets that are too big for your bed. They should not hang down onto the floor. Have a firm chair that has side arms. You can use this for support while you get dressed. Do not have throw rugs and other things on the floor that can make you trip. What can I do in the kitchen? Clean up any spills right away. Avoid walking on wet floors. Keep items that you use a lot in easy-to-reach places. If you need to reach something above you, use a strong step stool that has a grab bar. Keep electrical cords out of the way. Do not use floor polish or wax that makes floors slippery. If you must use wax, use non-skid floor wax. Do not have throw rugs and other things on the floor that can make you trip. What can I do with my stairs? Do not leave any items on the stairs. Make sure that there are handrails on both sides of the stairs and use them. Fix handrails that are broken or loose. Make sure that handrails are as long as the stairways. Check any carpeting to make sure that it is firmly attached to the stairs. Fix any carpet that is loose or worn. Avoid having throw rugs at the top or  bottom of the stairs. If you do have throw rugs, attach them to the floor with carpet tape. Make sure that you have a light switch at the top of the stairs and the bottom of the stairs. If you do not have them, ask someone to add them for you. What else can I do to help prevent falls? Wear shoes that: Do not have high heels. Have rubber bottoms. Are comfortable and fit you well. Are closed at the toe. Do not wear sandals. If you use a stepladder: Make sure that it is fully opened. Do not climb a closed stepladder. Make sure that both sides of the stepladder are locked into place. Ask someone to hold it for you, if possible. Clearly  mark and make sure that you can see: Any grab bars or handrails. First and last steps. Where the edge of each step is. Use tools that help you move around (mobility aids) if they are needed. These include: Canes. Walkers. Scooters. Crutches. Turn on the lights when you go into a dark area. Replace any light bulbs as soon as they burn out. Set up your furniture so you have a clear path. Avoid moving your furniture around. If any of your floors are uneven, fix them. If there are any pets around you, be aware of where they are. Review your medicines with your doctor. Some medicines can make you feel dizzy. This can increase your chance of falling. Ask your doctor what other things that you can do to help prevent falls. This information is not intended to replace advice given to you by your health care provider. Make sure you discuss any questions you have with your health care provider. Document Released: 08/22/2009 Document Revised: 04/02/2016 Document Reviewed: 11/30/2014 Elsevier Interactive Patient Education  2017 [employer].

## 2022-09-07 NOTE — Progress Notes (Signed)
I connected with [name] today by telephone and verified that I am speaking with the correct person using two identifiers. Location patient: home Location provider: work Persons participating in the virtual visit: [name], [name] LPN.   I discussed the limitations, risks, security and privacy concerns of performing an evaluation and management service by telephone and the availability of in person appointments. I also discussed with the patient that there may be a patient responsible charge related to this service. The patient expressed understanding and verbally consented to this telephonic visit.    Interactive audio and video telecommunications were attempted between this provider and patient, however failed, due to patient having technical difficulties OR patient did not have access to video capability.  We continued and completed visit with audio only.     Vital signs may be patient reported or missing.  Subjective:   [name] is a 71 y.o. female who presents for Medicare Annual (Subsequent) preventive examination.  Review of Systems     Cardiac Risk Factors include: advanced age (>7mn, >>26women);hypertension;obesity (BMI >30kg/m2)     Objective:    Today's Vitals   09/07/22 1456  Weight: 168 lb (76.2 kg)  Height: 5' (1.524 m)   Body mass index is 32.81 kg/m.     09/07/2022    3:01 PM 02/04/2022   10:06 PM 09/04/2021   10:42 AM 09/03/2020   10:51 AM 04/30/2016    7:49 PM 06/06/2014    9:05 AM  Advanced Directives  Does Patient Have a Medical Advance Directive? No No No Yes;No No Patient does not have advance directive  Does patient want to make changes to medical advance directive?    No - Patient declined    Would patient like information on creating a medical advance directive?   No - Patient declined No - Patient declined No - patient declined information     Current Medications (verified) Outpatient Encounter Medications as  of 09/07/2022  Medication Sig   albuterol (PROAIR HFA) 108 (90 Base) MCG/ACT inhaler INHALE 2 PUFFS INTO THE LUNGS EVERY 6 HOURS AS NEEDED FOR WHEEZING. (Patient taking differently: Inhale 2 puffs into the lungs every 6 (six) hours as needed for wheezing.)   aspirin 81 MG tablet Take 81 mg by mouth daily.   budesonide-formoterol (SYMBICORT) 80-4.5 MCG/ACT inhaler 2 puffs then rinse mouth, twice daily maintenance inhaler (Patient taking differently: Inhale 2 puffs into the lungs in the morning and at bedtime.)   EPINEPHrine 0.3 mg/0.3 mL IJ SOAJ injection Use as directed for severe allergic reaction   Flaxseed, Linseed, (FLAX SEED OIL) 1000 MG CAPS Take 1 capsule by mouth daily.   fluticasone (FLONASE) 50 MCG/ACT nasal spray USE 1 SPRAY IN EACH NOSTRIL TWICE DAILY (Patient taking differently: Place 1 spray into both nostrils daily.)   gabapentin (NEURONTIN) 100 MG capsule TAKE 1 CAPSULE BY MOUTH AT BEDTIME.   lisinopril-hydrochlorothiazide (ZESTORETIC) 20-12.5 MG tablet TAKE 1 TABLET EVERY DAY   LORazepam (ATIVAN) 1 MG tablet TAKE 1 TABLET EVERY 8 HOURS AS NEEDED FOR ANXIETY (Patient taking differently: Take 1 mg by mouth every 8 (eight) hours as needed for anxiety.)   meclizine (ANTIVERT) 25 MG tablet Take 1 tablet (25 mg total) by mouth every 4 (four) hours as needed for dizziness.   omeprazole (PRILOSEC) 20 MG capsule TAKE 1 CAPSULE EVERY DAY   potassium chloride SA (KLOR-CON M) 20 MEQ tablet TAKE 1 TABLET EVERY DAY   pyridOXINE (VITAMIN B-6) 100 MG tablet Take  100 mg by mouth daily.   vitamin B-12 (CYANOCOBALAMIN) 100 MCG tablet Take 100 mcg by mouth daily. (Patient not taking: Reported on 09/07/2022)   No facility-administered encounter medications on file as of 09/07/2022.    Allergies (verified) Doxycycline   History: Past Medical History:  Diagnosis Date   Allergy    Asthma    sees Dr. Baird Lyons   Colon polyp 2007   Depression    Diverticulosis    GERD (gastroesophageal  reflux disease)    Gynecological examination    sees Dr. Paula Compton   Hyperlipemia    Hypertension    Past Surgical History:  Procedure Laterality Date   CARPAL TUNNEL RELEASE Right 11/09/1978   COLONOSCOPY  06/17/2021   per Dr. Tarri Glenn, adenimatous polyps, repeat in 3 yrs   DILATION AND CURETTAGE OF UTERUS  11/10/2003   KNEE SURGERY     Family History  Problem Relation Age of Onset   Colon cancer Mother 40   Asthma Mother    Colon cancer Father 21   Liver cancer Father    Diabetes Sister    Throat cancer Brother    Colon cancer Maternal Aunt        not sure age of onset   Colon cancer Maternal Aunt        not sure age of onset   Pancreatic cancer Neg Hx    Rectal cancer Neg Hx    Stomach cancer Neg Hx    Allergic rhinitis Neg Hx    Angioedema Neg Hx    Eczema Neg Hx    Colon polyps Neg Hx    Esophageal cancer Neg Hx    Social History   Socioeconomic History   Marital status: Married    Spouse name: Not on file   Number of children: 2   Years of education: Not on file   Highest education level: Not on file  Occupational History    Employer: [employer]  Tobacco Use   Smoking status: Never   Smokeless tobacco: Never  Vaping Use   Vaping Use: Never used  Substance and Sexual Activity   Alcohol use: Yes    Alcohol/week: 0.0 standard drinks of alcohol    Comment: occ   Drug use: No   Sexual activity: Not on file  Other Topics Concern   Not on file  Social History Narrative   Daily caffeine    Social Determinants of Health   Financial Resource Strain: Low Risk  (09/07/2022)   Overall Financial Resource Strain (CARDIA)    Difficulty of Paying Living Expenses: Not hard at all  Food Insecurity: No Food Insecurity (09/07/2022)   Hunger Vital Sign    Worried About Running Out of Food in the Last Year: Never true    Ran Out of Food in the Last Year: Never true  Transportation Needs: No Transportation Needs (09/07/2022)   PRAPARE - Armed forces logistics/support/administrative officer (Medical): No    Lack of Transportation (Non-Medical): No  Physical Activity: Sufficiently Active (09/07/2022)   Exercise Vital Sign    Days of Exercise per Week: 7 days    Minutes of Exercise per Session: 30 min  Stress: No Stress Concern Present (09/07/2022)   [place]    Feeling of Stress : Not at all  Social Connections: Moderately Integrated (09/04/2021)   Social Connection and Isolation Panel [NHANES]    Frequency of Communication with Friends and  Family: Twice a week    Frequency of Social Gatherings with Friends and Family: Twice a week    Attends Religious Services: More than 4 times per year    Active Member of [employer] or Organizations: No    Attends Music therapist: Never    Marital Status: Married    Tobacco Counseling Counseling given: Not Answered   Clinical Intake:  Pre-visit preparation completed: Yes  Pain : No/denies pain     Nutritional Status: BMI > 30  Obese Nutritional Risks: None Diabetes: No  How often do you need to have someone help you when you read instructions, pamphlets, or other written materials from your doctor or pharmacy?: 1 - Never What is the last grade level you completed in school?: 12th grade  Diabetic? no  Interpreter Needed?: No  Information entered by :: NAllen LPN   Activities of Daily Living    09/07/2022    3:02 PM  In your present state of health, do you have any difficulty performing the following activities:  Hearing? 1  Comment has appt with ENT on Nov 14th  Vision? 0  Difficulty concentrating or making decisions? 0  Walking or climbing stairs? 0  Dressing or bathing? 0  Doing errands, shopping? 0  Preparing Food and eating ? N  Using the Toilet? N  In the past six months, have you accidently leaked urine? N  Do you have problems with loss of bowel control? N  Managing your Medications? N  Managing your  Finances? N  Housekeeping or managing your Housekeeping? N    Patient Care Team: Laurey Morale, MD as PCP - Judi Cong, MD as Consulting Physician (Obstetrics and Gynecology)  Indicate any recent Medical Services you may have received from other than [facility] providers in the past year (date may be approximate).     Assessment:   This is a routine wellness examination for Rasheedah.  Hearing/Vision screen Vision Screening - Comments:: Regular eye exams, Dr. Clifton James  Dietary issues and exercise activities discussed: Current Exercise Habits: Home exercise routine, Type of exercise: walking, Time (Minutes): 30, Frequency (Times/Week): 7, Weekly Exercise (Minutes/Week): 210   Goals Addressed             This Visit's Progress    Patient Stated       09/07/2022, wants to lose weight       Depression Screen    09/07/2022    3:02 PM 06/16/2022   10:12 AM 02/09/2022    1:42 PM 10/20/2021   10:38 AM 09/04/2021   10:43 AM 09/04/2021   10:39 AM 03/12/2021   10:17 AM  PHQ 2/9 Scores  PHQ - 2 Score 0 2 3 0 0 0 2  PHQ- 9 Score  '7 8 2   8    '$ Fall Risk    09/07/2022    3:02 PM 06/16/2022   10:11 AM 02/09/2022    1:42 PM 10/20/2021   10:39 AM 09/04/2021   10:43 AM  Fall Risk   Falls in the past year? 0 0 0 0 0  Number falls in past yr: 0 0 0 0 0  Injury with Fall? 0 0 0 0 0  Risk for fall due to : Medication side effect No Fall Risks No Fall Risks No Fall Risks No Fall Risks  Follow up Falls prevention discussed;Education provided;Falls evaluation completed Falls evaluation completed Falls evaluation completed Falls evaluation completed Falls evaluation completed    [place]  TO THE HOME:  Any stairs in or around the home? Yes  If so, are there any without handrails? No  Home free of loose throw rugs in walkways, pet beds, electrical cords, etc? Yes  Adequate lighting in your home to reduce risk of falls? Yes   ASSISTIVE DEVICES UTILIZED TO  PREVENT FALLS:  Life alert? No  Use of a cane, walker or w/c? No  Grab bars in the bathroom? Yes  Shower chair or bench in shower? Yes  Elevated toilet seat or a handicapped toilet? No   TIMED UP AND GO:  Was the test performed? No .      Cognitive Function:        09/07/2022    3:03 PM 09/03/2020   10:56 AM  6CIT Screen  What Year? 0 points 0 points  What month? 0 points 0 points  What time? 0 points 0 points  Count back from 20 0 points 0 points  Months in reverse 0 points 0 points  Repeat phrase 2 points 2 points  Total Score 2 points 2 points    Immunizations Immunization History  Administered Date(s) Administered   Fluad Quad(high Dose 65+) 08/24/2019, 08/26/2020, 08/14/2022   Influenza Split 08/10/2011, 08/09/2012, 08/02/2013   Influenza Whole 11/09/2004   Influenza,inj,Quad PF,6+ Mos 09/14/2016, 08/02/2018   Influenza-Unspecified 08/14/2014, 08/07/2015, 10/08/2021   PFIZER(Purple Top)SARS-COV-2 Vaccination 12/14/2019, 01/04/2020   Pneumococcal Conjugate-13 03/10/2016   Pneumococcal Polysaccharide-23 11/09/2004, 09/08/2010   Td 11/09/2002   Tdap 05/21/2013, 02/04/2022    TDAP status: Up to date  Flu Vaccine status: Up to date  Pneumococcal vaccine status: Up to date  Covid-19 vaccine status: Completed vaccines  Qualifies for Shingles Vaccine? Yes   Zostavax completed No   Shingrix Completed?: Yes  Screening Tests Health Maintenance  Topic Date Due   Hepatitis C Screening  Never done   MAMMOGRAM  08/06/2019   COVID-19 Vaccine (4 - Pfizer series) 04/18/2022   Medicare Annual Wellness (AWV)  09/04/2022   Pneumonia Vaccine 73+ Years old (3 - PPSV23 or PCV20) 11/06/2022 (Originally 02/22/2018)   COLONOSCOPY (Pts 45-77yr Insurance coverage will need to be confirmed)  06/17/2026   TETANUS/TDAP  02/05/2032   INFLUENZA VACCINE  Completed   DEXA SCAN  Completed   Zoster Vaccines- Shingrix  Completed   HPV VACCINES  Aged Out    Health  Maintenance  Health Maintenance Due  Topic Date Due   Hepatitis C Screening  Never done   MAMMOGRAM  08/06/2019   COVID-19 Vaccine (4 - Pfizer series) 04/18/2022   Medicare Annual Wellness (AWV)  09/04/2022    Colorectal cancer screening: Type of screening: Colonoscopy. Completed 06/17/2021. Repeat every 3 years  Mammogram status: scheduled for December  Bone Density status: Completed 06/09/2012.   Lung Cancer Screening: (Low Dose CT Chest recommended if Age 69-80years, 30 pack-year currently smoking OR have quit w/in 15years.) does not qualify.   Lung Cancer Screening Referral: no  Additional Screening:  Hepatitis C Screening: does qualify;   Vision Screening: Recommended annual ophthalmology exams for early detection of glaucoma and other disorders of the eye. Is the patient up to date with their annual eye exam?  Yes  Who is the provider or what is the name of the office in which the patient attends annual eye exams? Dr. MSela HuaIf pt is not established with a provider, would they like to be referred to a provider to establish care? No .   Dental Screening: Recommended annual dental  exams for proper oral hygiene  Community Resource Referral / Chronic Care Management: CRR required this visit?  No   CCM required this visit?  No      Plan:     I have personally reviewed and noted the following in the patient's chart:   Medical and social history Use of alcohol, tobacco or illicit drugs  Current medications and supplements including opioid prescriptions. Patient is not currently taking opioid prescriptions. Functional ability and status Nutritional status Physical activity Advanced directives List of other physicians Hospitalizations, surgeries, and ER visits in previous 12 months Vitals Screenings to include cognitive, depression, and falls Referrals and appointments  In addition, I have reviewed and discussed with patient certain preventive protocols, quality  metrics, and best practice recommendations. A written personalized care plan for preventive services as well as general preventive health recommendations were provided to patient.     Kellie Simmering, LPN   65/68/1275   Nurse Notes: none  Due to this being a virtual visit, the after visit summary with patients personalized plan was offered to patient via mail or my-chart. Patient would like to access on my-chart

## 2022-09-14 DIAGNOSIS — J3081 Allergic rhinitis due to animal (cat) (dog) hair and dander: Secondary | ICD-10-CM | POA: Diagnosis not present

## 2022-09-14 DIAGNOSIS — J301 Allergic rhinitis due to pollen: Secondary | ICD-10-CM | POA: Diagnosis not present

## 2022-09-14 DIAGNOSIS — J3089 Other allergic rhinitis: Secondary | ICD-10-CM | POA: Diagnosis not present

## 2022-09-21 DIAGNOSIS — J3081 Allergic rhinitis due to animal (cat) (dog) hair and dander: Secondary | ICD-10-CM | POA: Diagnosis not present

## 2022-09-21 DIAGNOSIS — J3089 Other allergic rhinitis: Secondary | ICD-10-CM | POA: Diagnosis not present

## 2022-09-21 DIAGNOSIS — J301 Allergic rhinitis due to pollen: Secondary | ICD-10-CM | POA: Diagnosis not present

## 2022-09-22 DIAGNOSIS — H903 Sensorineural hearing loss, bilateral: Secondary | ICD-10-CM | POA: Diagnosis not present

## 2022-09-22 DIAGNOSIS — J309 Allergic rhinitis, unspecified: Secondary | ICD-10-CM | POA: Diagnosis not present

## 2022-09-22 DIAGNOSIS — H9313 Tinnitus, bilateral: Secondary | ICD-10-CM | POA: Diagnosis not present

## 2022-09-28 DIAGNOSIS — J3081 Allergic rhinitis due to animal (cat) (dog) hair and dander: Secondary | ICD-10-CM | POA: Diagnosis not present

## 2022-09-28 DIAGNOSIS — J301 Allergic rhinitis due to pollen: Secondary | ICD-10-CM | POA: Diagnosis not present

## 2022-09-28 DIAGNOSIS — J3089 Other allergic rhinitis: Secondary | ICD-10-CM | POA: Diagnosis not present

## 2022-10-05 DIAGNOSIS — J3089 Other allergic rhinitis: Secondary | ICD-10-CM | POA: Diagnosis not present

## 2022-10-05 DIAGNOSIS — J3081 Allergic rhinitis due to animal (cat) (dog) hair and dander: Secondary | ICD-10-CM | POA: Diagnosis not present

## 2022-10-05 DIAGNOSIS — J301 Allergic rhinitis due to pollen: Secondary | ICD-10-CM | POA: Diagnosis not present

## 2022-10-06 DIAGNOSIS — R0981 Nasal congestion: Secondary | ICD-10-CM | POA: Diagnosis not present

## 2022-10-06 DIAGNOSIS — R059 Cough, unspecified: Secondary | ICD-10-CM | POA: Diagnosis not present

## 2022-10-06 DIAGNOSIS — R519 Headache, unspecified: Secondary | ICD-10-CM | POA: Diagnosis not present

## 2022-10-06 DIAGNOSIS — Z20822 Contact with and (suspected) exposure to covid-19: Secondary | ICD-10-CM | POA: Diagnosis not present

## 2022-10-07 ENCOUNTER — Telehealth (INDEPENDENT_AMBULATORY_CARE_PROVIDER_SITE_OTHER): Payer: Medicare HMO | Admitting: Family Medicine

## 2022-10-07 ENCOUNTER — Encounter: Payer: Self-pay | Admitting: Family Medicine

## 2022-10-07 DIAGNOSIS — Z20822 Contact with and (suspected) exposure to covid-19: Secondary | ICD-10-CM | POA: Diagnosis not present

## 2022-10-07 DIAGNOSIS — R059 Cough, unspecified: Secondary | ICD-10-CM | POA: Diagnosis not present

## 2022-10-07 DIAGNOSIS — U071 COVID-19: Secondary | ICD-10-CM

## 2022-10-07 DIAGNOSIS — R0981 Nasal congestion: Secondary | ICD-10-CM

## 2022-10-07 DIAGNOSIS — R519 Headache, unspecified: Secondary | ICD-10-CM

## 2022-10-07 LAB — POC COVID19 BINAXNOW: SARS Coronavirus 2 Ag: POSITIVE — AB

## 2022-10-07 MED ORDER — NIRMATRELVIR/RITONAVIR (PAXLOVID)TABLET: 3.0000 | ORAL_TABLET | Freq: Two times a day (BID) | ORAL | 0 refills | Status: AC

## 2022-10-07 NOTE — Progress Notes (Signed)
Subjective:    Patient ID: [name], female    DOB: 22-May-1951, 71 y.o.   MRN: [id number]  HPI Virtual Visit via Video Note  I connected with the patient on 10/07/22 at  4:00 PM EST by a video enabled telemedicine application and verified that I am speaking with the correct person using two identifiers.  Location patient: home Location provider:work or home office Persons participating in the virtual visit: patient, provider  I discussed the limitations of evaluation and management by telemedicine and the availability of in person appointments. The patient expressed understanding and agreed to proceed.   HPI: Here for a Covid infection. She started to feel bad 2 days ago, but she felt worse today. She has chest congestion, a dry cough, and body aches. No more SOB than usual, but she is using her inhalers. No fever.    ROS: See pertinent positives and negatives per HPI.  Past Medical History:  Diagnosis Date   Allergy    Asthma    sees Dr. Baird Lyons   Colon polyp 2007   Depression    Diverticulosis    GERD (gastroesophageal reflux disease)    Gynecological examination    sees Dr. Paula Compton   Hyperlipemia    Hypertension     Past Surgical History:  Procedure Laterality Date   CARPAL TUNNEL RELEASE Right 11/09/1978   COLONOSCOPY  06/17/2021   per Dr. Tarri Glenn, adenimatous polyps, repeat in 3 yrs   DILATION AND CURETTAGE OF UTERUS  11/10/2003   KNEE SURGERY      Family History  Problem Relation Age of Onset   Colon cancer Mother 29   Asthma Mother    Colon cancer Father 58   Liver cancer Father    Diabetes Sister    Throat cancer Brother    Colon cancer Maternal Aunt        not sure age of onset   Colon cancer Maternal Aunt        not sure age of onset   Pancreatic cancer Neg Hx    Rectal cancer Neg Hx    Stomach cancer Neg Hx    Allergic rhinitis Neg Hx    Angioedema Neg Hx    Eczema Neg Hx    Colon polyps Neg Hx    Esophageal cancer  Neg Hx      Current Outpatient Medications:    albuterol (PROAIR HFA) 108 (90 Base) MCG/ACT inhaler, INHALE 2 PUFFS INTO THE LUNGS EVERY 6 HOURS AS NEEDED FOR WHEEZING. (Patient taking differently: Inhale 2 puffs into the lungs every 6 (six) hours as needed for wheezing.), Disp: 3 Inhaler, Rfl: 3   aspirin 81 MG tablet, Take 81 mg by mouth daily., Disp: , Rfl:    budesonide-formoterol (SYMBICORT) 80-4.5 MCG/ACT inhaler, 2 puffs then rinse mouth, twice daily maintenance inhaler (Patient taking differently: Inhale 2 puffs into the lungs in the morning and at bedtime.), Disp: 3 Inhaler, Rfl: 3   EPINEPHrine 0.3 mg/0.3 mL IJ SOAJ injection, Use as directed for severe allergic reaction, Disp: 2 Device, Rfl: 1   Flaxseed, Linseed, (FLAX SEED OIL) 1000 MG CAPS, Take 1 capsule by mouth daily., Disp: , Rfl:    fluticasone (FLONASE) 50 MCG/ACT nasal spray, USE 1 SPRAY IN EACH NOSTRIL TWICE DAILY (Patient taking differently: Place 1 spray into both nostrils daily.), Disp: 48 g, Rfl: 3   gabapentin (NEURONTIN) 100 MG capsule, TAKE 1 CAPSULE BY MOUTH AT BEDTIME., Disp: 30 capsule, Rfl: 3  lisinopril-hydrochlorothiazide (ZESTORETIC) 20-12.5 MG tablet, TAKE 1 TABLET EVERY DAY, Disp: 90 tablet, Rfl: 1   LORazepam (ATIVAN) 1 MG tablet, TAKE 1 TABLET EVERY 8 HOURS AS NEEDED FOR ANXIETY (Patient taking differently: Take 1 mg by mouth every 8 (eight) hours as needed for anxiety.), Disp: 90 tablet, Rfl: 5   meclizine (ANTIVERT) 25 MG tablet, Take 1 tablet (25 mg total) by mouth every 4 (four) hours as needed for dizziness., Disp: 60 tablet, Rfl: 2   nirmatrelvir/ritonavir EUA (PAXLOVID) 20 x 150 MG & 10 x '100MG'$  TABS, Take 3 tablets by mouth 2 (two) times daily for 5 days. (Take nirmatrelvir 150 mg two tablets twice daily for 5 days and ritonavir 100 mg one tablet twice daily for 5 days) Patient GFR is 60, Disp: 30 tablet, Rfl: 0   omeprazole (PRILOSEC) 20 MG capsule, TAKE 1 CAPSULE EVERY DAY, Disp: 90 capsule, Rfl: 1    potassium chloride SA (KLOR-CON M) 20 MEQ tablet, TAKE 1 TABLET EVERY DAY, Disp: 90 tablet, Rfl: 1   pyridOXINE (VITAMIN B-6) 100 MG tablet, Take 100 mg by mouth daily., Disp: , Rfl:    vitamin B-12 (CYANOCOBALAMIN) 100 MCG tablet, Take 100 mcg by mouth daily., Disp: , Rfl:   EXAM:  VITALS per patient if applicable:  GENERAL: alert, oriented, appears well and in no acute distress  HEENT: atraumatic, conjunttiva clear, no obvious abnormalities on inspection of external nose and ears  NECK: normal movements of the head and neck  LUNGS: on inspection no signs of respiratory distress, breathing rate appears normal, no obvious gross SOB, gasping or wheezing  CV: no obvious cyanosis  MS: moves all visible extremities without noticeable abnormality  PSYCH/NEURO: pleasant and cooperative, no obvious depression or anxiety, speech and thought processing grossly intact  ASSESSMENT AND PLAN: Covid infection. Treat with 5 days of Paxlovid. Recheck as needed.  Alysia Penna, MD  Discussed the following assessment and plan:  Acute nonintractable headache, unspecified headache type - Plan: POC COVID-19 BinaxNow  Nasal congestion - Plan: POC COVID-19 BinaxNow     I discussed the assessment and treatment plan with the patient. The patient was provided an opportunity to ask questions and all were answered. The patient agreed with the plan and demonstrated an understanding of the instructions.   The patient was advised to call back or seek an in-person evaluation if the symptoms worsen or if the condition fails to improve as anticipated.      Review of Systems     Objective:   Physical Exam        Assessment & Plan:

## 2022-10-20 ENCOUNTER — Encounter: Payer: Self-pay | Admitting: Family Medicine

## 2022-10-20 ENCOUNTER — Ambulatory Visit (INDEPENDENT_AMBULATORY_CARE_PROVIDER_SITE_OTHER): Payer: Medicare HMO | Admitting: Family Medicine

## 2022-10-20 VITALS — BP 110/70 | HR 78 | Temp 98.2°F | Ht 60.0 in | Wt 180.2 lb

## 2022-10-20 DIAGNOSIS — F418 Other specified anxiety disorders: Secondary | ICD-10-CM

## 2022-10-20 DIAGNOSIS — E538 Deficiency of other specified B group vitamins: Secondary | ICD-10-CM | POA: Diagnosis not present

## 2022-10-20 DIAGNOSIS — R739 Hyperglycemia, unspecified: Secondary | ICD-10-CM

## 2022-10-20 DIAGNOSIS — I10 Essential (primary) hypertension: Secondary | ICD-10-CM

## 2022-10-20 DIAGNOSIS — R42 Dizziness and giddiness: Secondary | ICD-10-CM

## 2022-10-20 DIAGNOSIS — G629 Polyneuropathy, unspecified: Secondary | ICD-10-CM

## 2022-10-20 DIAGNOSIS — K219 Gastro-esophageal reflux disease without esophagitis: Secondary | ICD-10-CM

## 2022-10-20 DIAGNOSIS — K589 Irritable bowel syndrome without diarrhea: Secondary | ICD-10-CM | POA: Diagnosis not present

## 2022-10-20 DIAGNOSIS — J3081 Allergic rhinitis due to animal (cat) (dog) hair and dander: Secondary | ICD-10-CM | POA: Diagnosis not present

## 2022-10-20 DIAGNOSIS — J301 Allergic rhinitis due to pollen: Secondary | ICD-10-CM | POA: Diagnosis not present

## 2022-10-20 DIAGNOSIS — J3089 Other allergic rhinitis: Secondary | ICD-10-CM | POA: Diagnosis not present

## 2022-10-20 DIAGNOSIS — D649 Anemia, unspecified: Secondary | ICD-10-CM

## 2022-10-20 DIAGNOSIS — E876 Hypokalemia: Secondary | ICD-10-CM | POA: Diagnosis not present

## 2022-10-20 LAB — HEPATIC FUNCTION PANEL
ALT: 21 U/L (ref 0–35)
AST: 16 U/L (ref 0–37)
Albumin: 4.3 g/dL (ref 3.5–5.2)
Alkaline Phosphatase: 76 U/L (ref 39–117)
Bilirubin, Direct: 0.1 mg/dL (ref 0.0–0.3)
Total Bilirubin: 0.4 mg/dL (ref 0.2–1.2)
Total Protein: 6.8 g/dL (ref 6.0–8.3)

## 2022-10-20 LAB — LIPID PANEL
Cholesterol: 245 mg/dL — ABNORMAL HIGH (ref 0–200)
HDL: 50.4 mg/dL (ref >39.00)
NonHDL: 194.77
Total CHOL/HDL Ratio: 5
Triglycerides: 263 mg/dL — ABNORMAL HIGH (ref 0.0–149.0)
VLDL: 52.6 mg/dL — ABNORMAL HIGH (ref 0.0–40.0)

## 2022-10-20 LAB — CBC WITH DIFFERENTIAL/PLATELET
Basophils Absolute: 0.1 K/uL (ref 0.0–0.1)
Basophils Relative: 1.2 % (ref 0.0–3.0)
Eosinophils Absolute: 0.1 K/uL (ref 0.0–0.7)
Eosinophils Relative: 1.4 % (ref 0.0–5.0)
HCT: 38.2 % (ref 36.0–46.0)
Hemoglobin: 12.8 g/dL (ref 12.0–15.0)
Lymphocytes Relative: 18.2 % (ref 12.0–46.0)
Lymphs Abs: 1.7 K/uL (ref 0.7–4.0)
MCHC: 33.4 g/dL (ref 30.0–36.0)
MCV: 93.5 fl (ref 78.0–100.0)
Monocytes Absolute: 0.6 K/uL (ref 0.1–1.0)
Monocytes Relative: 6.7 % (ref 3.0–12.0)
Neutro Abs: 6.7 K/uL (ref 1.4–7.7)
Neutrophils Relative %: 72.5 % (ref 43.0–77.0)
Platelets: 228 K/uL (ref 150.0–400.0)
RBC: 4.09 Mil/uL (ref 3.87–5.11)
RDW: 14.4 % (ref 11.5–15.5)
WBC: 9.2 K/uL (ref 4.0–10.5)

## 2022-10-20 LAB — MAGNESIUM: Magnesium: 1.8 mg/dL (ref 1.5–2.5)

## 2022-10-20 LAB — BASIC METABOLIC PANEL
BUN: 11 mg/dL (ref 6–23)
CO2: 29 mEq/L (ref 19–32)
Calcium: 9.7 mg/dL (ref 8.4–10.5)
Chloride: 105 mEq/L (ref 96–112)
Creatinine, Ser: 0.92 mg/dL (ref 0.40–1.20)
GFR: 63.47 mL/min (ref >60.00)
Glucose, Bld: 122 mg/dL — ABNORMAL HIGH (ref 70–99)
Potassium: 4.1 mEq/L (ref 3.5–5.1)
Sodium: 143 mEq/L (ref 135–145)

## 2022-10-20 LAB — HEMOGLOBIN A1C: Hgb A1c MFr Bld: 5.8 % (ref 4.6–6.5)

## 2022-10-20 LAB — PHOSPHORUS: Phosphorus: 3.3 mg/dL (ref 2.3–4.6)

## 2022-10-20 LAB — VITAMIN B12: Vitamin B-12: >1500 pg/mL — ABNORMAL HIGH (ref 211–911)

## 2022-10-20 LAB — TSH: TSH: 2.1 u[IU]/mL (ref 0.35–5.50)

## 2022-10-20 LAB — LDL CHOLESTEROL, DIRECT: Direct LDL: 151 mg/dL

## 2022-10-20 MED ORDER — POTASSIUM CHLORIDE CRYS ER 20 MEQ PO TBCR
20.0000 meq | EXTENDED_RELEASE_TABLET | Freq: Every day | ORAL | 3 refills | Status: AC
Start: 2022-10-20 — End: ?

## 2022-10-20 MED ORDER — LISINOPRIL-HYDROCHLOROTHIAZIDE 20-12.5 MG PO TABS: 1.0000 | ORAL_TABLET | Freq: Every day | ORAL | 3 refills | Status: DC

## 2022-10-20 MED ORDER — OMEPRAZOLE 20 MG PO CPDR: 20.0000 mg | DELAYED_RELEASE_CAPSULE | Freq: Every day | ORAL | 3 refills | Status: DC

## 2022-10-20 NOTE — Progress Notes (Signed)
Subjective:    Patient ID: [name], female    DOB: 19-Jul-1951, 71 y.o.   MRN: [id number]  HPI Here to follow up on issues. In general she feels well. Her BP is stable. Her IBS and GERD are well controlled. The neuropathy in her feet is controlled with a combination of Gabapentin and an OTC herbal product she takes. She has not had trouble with vertigo for some time now. Her anxiety and depression are stable.    Review of Systems  Constitutional: Negative.   HENT: Negative.    Eyes: Negative.   Respiratory: Negative.    Cardiovascular: Negative.   Gastrointestinal: Negative.   Genitourinary:  Negative for decreased urine volume, difficulty urinating, dyspareunia, dysuria, enuresis, flank pain, frequency, hematuria, pelvic pain and urgency.  Musculoskeletal: Negative.   Skin: Negative.   Neurological:  Positive for numbness. Negative for headaches.  Psychiatric/Behavioral: Negative.         Objective:   Physical Exam Constitutional:      General: She is not in acute distress.    Appearance: Normal appearance. She is well-developed.  HENT:     Head: Normocephalic and atraumatic.     Right Ear: External ear normal.     Left Ear: External ear normal.     Nose: Nose normal.     Mouth/Throat:     Pharynx: No oropharyngeal exudate.  Eyes:     General: No scleral icterus.    Conjunctiva/sclera: Conjunctivae normal.     Pupils: Pupils are equal, round, and reactive to light.  Neck:     Thyroid: No thyromegaly.     Vascular: No JVD.  Cardiovascular:     Rate and Rhythm: Normal rate and regular rhythm.     Heart sounds: Normal heart sounds. No murmur heard.    No friction rub. No gallop.  Pulmonary:     Effort: Pulmonary effort is normal. No respiratory distress.     Breath sounds: Normal breath sounds. No wheezing or rales.  Chest:     Chest wall: No tenderness.  Abdominal:     General: Bowel sounds are normal. There is no distension.     Palpations: Abdomen is  soft. There is no mass.     Tenderness: There is no abdominal tenderness. There is no guarding or rebound.  Musculoskeletal:        General: No tenderness. Normal range of motion.     Cervical back: Normal range of motion and neck supple.  Lymphadenopathy:     Cervical: No cervical adenopathy.  Skin:    General: Skin is warm and dry.     Findings: No erythema or rash.  Neurological:     Mental Status: She is alert and oriented to person, place, and time.     Cranial Nerves: No cranial nerve deficit.     Motor: No abnormal muscle tone.     Coordination: Coordination normal.     Deep Tendon Reflexes: Reflexes are normal and symmetric. Reflexes normal.  Psychiatric:        Behavior: Behavior normal.        Thought Content: Thought content normal.        Judgment: Judgment normal.           Assessment & Plan:  She is doing well. Her HTN and GERD and IBS are stable. Her anxiety and depression and neuropathy are stable. We will get fasting labs to check lipids, A1c ,etc. We spent a total of ( 32  )  minutes reviewing records and discussing these issues.  Alysia Penna, MD

## 2022-10-21 ENCOUNTER — Other Ambulatory Visit: Payer: Self-pay

## 2022-10-21 ENCOUNTER — Telehealth: Payer: Self-pay

## 2022-10-21 DIAGNOSIS — E785 Hyperlipidemia, unspecified: Secondary | ICD-10-CM

## 2022-10-21 MED ORDER — ATORVASTATIN CALCIUM 10 MG PO TABS: 10.0000 mg | ORAL_TABLET | Freq: Every day | ORAL | 3 refills | Status: DC

## 2022-10-21 NOTE — Telephone Encounter (Signed)
Actually she can just take B12 1000 mcg daily

## 2022-10-21 NOTE — Telephone Encounter (Signed)
Spoke with patient about lab results.   Patient has concerns about her Vitamin B12 level.  Patient stated that she currently takes B12 5000 Mcg daily and should she continue to take this daily. Please advise

## 2022-10-21 NOTE — Telephone Encounter (Signed)
Called patient message given.

## 2022-10-22 DIAGNOSIS — Z0142 Encounter for cervical smear to confirm findings of recent normal smear following initial abnormal smear: Secondary | ICD-10-CM | POA: Diagnosis not present

## 2022-10-22 DIAGNOSIS — Z124 Encounter for screening for malignant neoplasm of cervix: Secondary | ICD-10-CM | POA: Diagnosis not present

## 2022-10-22 DIAGNOSIS — Z01419 Encounter for gynecological examination (general) (routine) without abnormal findings: Secondary | ICD-10-CM | POA: Diagnosis not present

## 2022-10-22 DIAGNOSIS — Z1231 Encounter for screening mammogram for malignant neoplasm of breast: Secondary | ICD-10-CM | POA: Diagnosis not present

## 2022-10-22 DIAGNOSIS — Z1272 Encounter for screening for malignant neoplasm of vagina: Secondary | ICD-10-CM | POA: Diagnosis not present

## 2022-10-22 DIAGNOSIS — Z1151 Encounter for screening for human papillomavirus (HPV): Secondary | ICD-10-CM | POA: Diagnosis not present

## 2022-10-26 DIAGNOSIS — J4540 Moderate persistent asthma, uncomplicated: Secondary | ICD-10-CM | POA: Diagnosis not present

## 2022-10-26 DIAGNOSIS — J3089 Other allergic rhinitis: Secondary | ICD-10-CM | POA: Diagnosis not present

## 2022-10-26 DIAGNOSIS — J301 Allergic rhinitis due to pollen: Secondary | ICD-10-CM | POA: Diagnosis not present

## 2022-10-26 DIAGNOSIS — J3081 Allergic rhinitis due to animal (cat) (dog) hair and dander: Secondary | ICD-10-CM | POA: Diagnosis not present

## 2022-10-28 ENCOUNTER — Other Ambulatory Visit: Payer: Self-pay | Admitting: Obstetrics and Gynecology

## 2022-10-28 DIAGNOSIS — R928 Other abnormal and inconclusive findings on diagnostic imaging of breast: Secondary | ICD-10-CM

## 2022-11-04 DIAGNOSIS — J3081 Allergic rhinitis due to animal (cat) (dog) hair and dander: Secondary | ICD-10-CM | POA: Diagnosis not present

## 2022-11-04 DIAGNOSIS — J3089 Other allergic rhinitis: Secondary | ICD-10-CM | POA: Diagnosis not present

## 2022-11-04 DIAGNOSIS — J301 Allergic rhinitis due to pollen: Secondary | ICD-10-CM | POA: Diagnosis not present

## 2022-11-05 ENCOUNTER — Other Ambulatory Visit: Payer: Self-pay | Admitting: Family Medicine

## 2022-11-05 NOTE — Telephone Encounter (Signed)
Medication not listed on med list. Last OV-10/20/22  No future OV scheduled.

## 2022-11-06 DIAGNOSIS — R059 Cough, unspecified: Secondary | ICD-10-CM | POA: Diagnosis not present

## 2022-11-06 DIAGNOSIS — R0981 Nasal congestion: Secondary | ICD-10-CM | POA: Diagnosis not present

## 2022-11-06 DIAGNOSIS — Z20822 Contact with and (suspected) exposure to covid-19: Secondary | ICD-10-CM | POA: Diagnosis not present

## 2022-11-06 DIAGNOSIS — R519 Headache, unspecified: Secondary | ICD-10-CM | POA: Diagnosis not present

## 2022-11-07 DIAGNOSIS — R059 Cough, unspecified: Secondary | ICD-10-CM | POA: Diagnosis not present

## 2022-11-07 DIAGNOSIS — Z20822 Contact with and (suspected) exposure to covid-19: Secondary | ICD-10-CM | POA: Diagnosis not present

## 2022-11-07 DIAGNOSIS — R0981 Nasal congestion: Secondary | ICD-10-CM | POA: Diagnosis not present

## 2022-11-07 DIAGNOSIS — R519 Headache, unspecified: Secondary | ICD-10-CM | POA: Diagnosis not present

## 2022-11-08 DIAGNOSIS — R0981 Nasal congestion: Secondary | ICD-10-CM | POA: Diagnosis not present

## 2022-11-08 DIAGNOSIS — R519 Headache, unspecified: Secondary | ICD-10-CM | POA: Diagnosis not present

## 2022-11-08 DIAGNOSIS — R059 Cough, unspecified: Secondary | ICD-10-CM | POA: Diagnosis not present

## 2022-11-08 DIAGNOSIS — Z20822 Contact with and (suspected) exposure to covid-19: Secondary | ICD-10-CM | POA: Diagnosis not present

## 2022-11-10 DIAGNOSIS — J3089 Other allergic rhinitis: Secondary | ICD-10-CM | POA: Diagnosis not present

## 2022-11-11 DIAGNOSIS — R0981 Nasal congestion: Secondary | ICD-10-CM | POA: Diagnosis not present

## 2022-11-11 DIAGNOSIS — Z20822 Contact with and (suspected) exposure to covid-19: Secondary | ICD-10-CM | POA: Diagnosis not present

## 2022-11-11 DIAGNOSIS — R519 Headache, unspecified: Secondary | ICD-10-CM | POA: Diagnosis not present

## 2022-11-11 DIAGNOSIS — R059 Cough, unspecified: Secondary | ICD-10-CM | POA: Diagnosis not present

## 2022-11-12 ENCOUNTER — Ambulatory Visit
Admission: RE | Admit: 2022-11-12 | Discharge: 2022-11-12 | Disposition: A | Discharge status: Home / Self Care | Payer: Medicare HMO | Source: Ambulatory Visit | Attending: Obstetrics and Gynecology | Admitting: Obstetrics and Gynecology

## 2022-11-12 DIAGNOSIS — R928 Other abnormal and inconclusive findings on diagnostic imaging of breast: Secondary | ICD-10-CM

## 2022-11-12 DIAGNOSIS — N641 Fat necrosis of breast: Secondary | ICD-10-CM | POA: Diagnosis not present

## 2022-11-12 DIAGNOSIS — N6489 Other specified disorders of breast: Secondary | ICD-10-CM | POA: Diagnosis not present

## 2022-11-13 DIAGNOSIS — R519 Headache, unspecified: Secondary | ICD-10-CM | POA: Diagnosis not present

## 2022-11-13 DIAGNOSIS — R059 Cough, unspecified: Secondary | ICD-10-CM | POA: Diagnosis not present

## 2022-11-13 DIAGNOSIS — R0981 Nasal congestion: Secondary | ICD-10-CM | POA: Diagnosis not present

## 2022-11-13 DIAGNOSIS — Z20822 Contact with and (suspected) exposure to covid-19: Secondary | ICD-10-CM | POA: Diagnosis not present

## 2022-11-14 DIAGNOSIS — R059 Cough, unspecified: Secondary | ICD-10-CM | POA: Diagnosis not present

## 2022-11-14 DIAGNOSIS — R0981 Nasal congestion: Secondary | ICD-10-CM | POA: Diagnosis not present

## 2022-11-14 DIAGNOSIS — Z20822 Contact with and (suspected) exposure to covid-19: Secondary | ICD-10-CM | POA: Diagnosis not present

## 2022-11-14 DIAGNOSIS — R519 Headache, unspecified: Secondary | ICD-10-CM | POA: Diagnosis not present

## 2022-11-16 DIAGNOSIS — J301 Allergic rhinitis due to pollen: Secondary | ICD-10-CM | POA: Diagnosis not present

## 2022-11-16 DIAGNOSIS — J3081 Allergic rhinitis due to animal (cat) (dog) hair and dander: Secondary | ICD-10-CM | POA: Diagnosis not present

## 2022-11-16 DIAGNOSIS — J3089 Other allergic rhinitis: Secondary | ICD-10-CM | POA: Diagnosis not present

## 2022-11-17 DIAGNOSIS — Z20822 Contact with and (suspected) exposure to covid-19: Secondary | ICD-10-CM | POA: Diagnosis not present

## 2022-11-17 DIAGNOSIS — R059 Cough, unspecified: Secondary | ICD-10-CM | POA: Diagnosis not present

## 2022-11-17 DIAGNOSIS — R519 Headache, unspecified: Secondary | ICD-10-CM | POA: Diagnosis not present

## 2022-11-17 DIAGNOSIS — R0981 Nasal congestion: Secondary | ICD-10-CM | POA: Diagnosis not present

## 2022-11-23 DIAGNOSIS — J3081 Allergic rhinitis due to animal (cat) (dog) hair and dander: Secondary | ICD-10-CM | POA: Diagnosis not present

## 2022-11-23 DIAGNOSIS — J3089 Other allergic rhinitis: Secondary | ICD-10-CM | POA: Diagnosis not present

## 2022-11-23 DIAGNOSIS — J301 Allergic rhinitis due to pollen: Secondary | ICD-10-CM | POA: Diagnosis not present

## 2022-11-30 DIAGNOSIS — J301 Allergic rhinitis due to pollen: Secondary | ICD-10-CM | POA: Diagnosis not present

## 2022-11-30 DIAGNOSIS — J3081 Allergic rhinitis due to animal (cat) (dog) hair and dander: Secondary | ICD-10-CM | POA: Diagnosis not present

## 2022-11-30 DIAGNOSIS — J3089 Other allergic rhinitis: Secondary | ICD-10-CM | POA: Diagnosis not present

## 2022-12-05 ENCOUNTER — Other Ambulatory Visit: Payer: Self-pay | Admitting: Family Medicine

## 2022-12-08 ENCOUNTER — Other Ambulatory Visit: Payer: Self-pay | Admitting: Family Medicine

## 2022-12-08 DIAGNOSIS — G629 Polyneuropathy, unspecified: Secondary | ICD-10-CM

## 2022-12-08 DIAGNOSIS — J3081 Allergic rhinitis due to animal (cat) (dog) hair and dander: Secondary | ICD-10-CM | POA: Diagnosis not present

## 2022-12-08 DIAGNOSIS — J301 Allergic rhinitis due to pollen: Secondary | ICD-10-CM | POA: Diagnosis not present

## 2022-12-08 DIAGNOSIS — J3089 Other allergic rhinitis: Secondary | ICD-10-CM | POA: Diagnosis not present

## 2022-12-14 DIAGNOSIS — J3089 Other allergic rhinitis: Secondary | ICD-10-CM | POA: Diagnosis not present

## 2022-12-14 DIAGNOSIS — J3081 Allergic rhinitis due to animal (cat) (dog) hair and dander: Secondary | ICD-10-CM | POA: Diagnosis not present

## 2022-12-14 DIAGNOSIS — J301 Allergic rhinitis due to pollen: Secondary | ICD-10-CM | POA: Diagnosis not present

## 2022-12-21 DIAGNOSIS — J3081 Allergic rhinitis due to animal (cat) (dog) hair and dander: Secondary | ICD-10-CM | POA: Diagnosis not present

## 2022-12-21 DIAGNOSIS — J301 Allergic rhinitis due to pollen: Secondary | ICD-10-CM | POA: Diagnosis not present

## 2022-12-21 DIAGNOSIS — J3089 Other allergic rhinitis: Secondary | ICD-10-CM | POA: Diagnosis not present

## 2022-12-22 DIAGNOSIS — D1801 Hemangioma of skin and subcutaneous tissue: Secondary | ICD-10-CM | POA: Diagnosis not present

## 2022-12-22 DIAGNOSIS — L821 Other seborrheic keratosis: Secondary | ICD-10-CM | POA: Diagnosis not present

## 2022-12-22 DIAGNOSIS — L814 Other melanin hyperpigmentation: Secondary | ICD-10-CM | POA: Diagnosis not present

## 2022-12-28 DIAGNOSIS — J3081 Allergic rhinitis due to animal (cat) (dog) hair and dander: Secondary | ICD-10-CM | POA: Diagnosis not present

## 2022-12-28 DIAGNOSIS — J3089 Other allergic rhinitis: Secondary | ICD-10-CM | POA: Diagnosis not present

## 2022-12-28 DIAGNOSIS — J301 Allergic rhinitis due to pollen: Secondary | ICD-10-CM | POA: Diagnosis not present

## 2023-01-01 DIAGNOSIS — M1711 Unilateral primary osteoarthritis, right knee: Secondary | ICD-10-CM | POA: Diagnosis not present

## 2023-01-04 DIAGNOSIS — J3089 Other allergic rhinitis: Secondary | ICD-10-CM | POA: Diagnosis not present

## 2023-01-04 DIAGNOSIS — J301 Allergic rhinitis due to pollen: Secondary | ICD-10-CM | POA: Diagnosis not present

## 2023-01-04 DIAGNOSIS — J3081 Allergic rhinitis due to animal (cat) (dog) hair and dander: Secondary | ICD-10-CM | POA: Diagnosis not present

## 2023-01-07 DIAGNOSIS — H903 Sensorineural hearing loss, bilateral: Secondary | ICD-10-CM | POA: Diagnosis not present

## 2023-01-11 DIAGNOSIS — J301 Allergic rhinitis due to pollen: Secondary | ICD-10-CM | POA: Diagnosis not present

## 2023-01-11 DIAGNOSIS — J3089 Other allergic rhinitis: Secondary | ICD-10-CM | POA: Diagnosis not present

## 2023-01-11 DIAGNOSIS — J3081 Allergic rhinitis due to animal (cat) (dog) hair and dander: Secondary | ICD-10-CM | POA: Diagnosis not present

## 2023-01-13 ENCOUNTER — Other Ambulatory Visit: Payer: Self-pay

## 2023-01-18 DIAGNOSIS — J301 Allergic rhinitis due to pollen: Secondary | ICD-10-CM | POA: Diagnosis not present

## 2023-01-18 DIAGNOSIS — J3081 Allergic rhinitis due to animal (cat) (dog) hair and dander: Secondary | ICD-10-CM | POA: Diagnosis not present

## 2023-01-18 DIAGNOSIS — J3089 Other allergic rhinitis: Secondary | ICD-10-CM | POA: Diagnosis not present

## 2023-01-25 ENCOUNTER — Other Ambulatory Visit (INDEPENDENT_AMBULATORY_CARE_PROVIDER_SITE_OTHER): Payer: Medicare HMO

## 2023-01-25 DIAGNOSIS — J301 Allergic rhinitis due to pollen: Secondary | ICD-10-CM | POA: Diagnosis not present

## 2023-01-25 DIAGNOSIS — J3081 Allergic rhinitis due to animal (cat) (dog) hair and dander: Secondary | ICD-10-CM | POA: Diagnosis not present

## 2023-01-25 DIAGNOSIS — J3089 Other allergic rhinitis: Secondary | ICD-10-CM | POA: Diagnosis not present

## 2023-01-25 DIAGNOSIS — E785 Hyperlipidemia, unspecified: Secondary | ICD-10-CM

## 2023-01-25 LAB — LIPID PANEL
Cholesterol: 162 mg/dL (ref 0–200)
HDL: 58.9 mg/dL (ref >39.00)
LDL Cholesterol: 70 mg/dL (ref 0–99)
NonHDL: 103.38
Total CHOL/HDL Ratio: 3
Triglycerides: 166 mg/dL — ABNORMAL HIGH (ref 0.0–149.0)
VLDL: 33.2 mg/dL (ref 0.0–40.0)

## 2023-02-01 DIAGNOSIS — J3089 Other allergic rhinitis: Secondary | ICD-10-CM | POA: Diagnosis not present

## 2023-02-15 DIAGNOSIS — J3089 Other allergic rhinitis: Secondary | ICD-10-CM | POA: Diagnosis not present

## 2023-02-15 DIAGNOSIS — J3081 Allergic rhinitis due to animal (cat) (dog) hair and dander: Secondary | ICD-10-CM | POA: Diagnosis not present

## 2023-02-15 DIAGNOSIS — J301 Allergic rhinitis due to pollen: Secondary | ICD-10-CM | POA: Diagnosis not present

## 2023-02-22 DIAGNOSIS — J3081 Allergic rhinitis due to animal (cat) (dog) hair and dander: Secondary | ICD-10-CM | POA: Diagnosis not present

## 2023-02-22 DIAGNOSIS — J301 Allergic rhinitis due to pollen: Secondary | ICD-10-CM | POA: Diagnosis not present

## 2023-02-22 DIAGNOSIS — J3089 Other allergic rhinitis: Secondary | ICD-10-CM | POA: Diagnosis not present

## 2023-02-23 DIAGNOSIS — J3089 Other allergic rhinitis: Secondary | ICD-10-CM | POA: Diagnosis not present

## 2023-03-02 DIAGNOSIS — J3089 Other allergic rhinitis: Secondary | ICD-10-CM | POA: Diagnosis not present

## 2023-03-02 DIAGNOSIS — J3081 Allergic rhinitis due to animal (cat) (dog) hair and dander: Secondary | ICD-10-CM | POA: Diagnosis not present

## 2023-03-02 DIAGNOSIS — J301 Allergic rhinitis due to pollen: Secondary | ICD-10-CM | POA: Diagnosis not present

## 2023-03-09 DIAGNOSIS — J3089 Other allergic rhinitis: Secondary | ICD-10-CM | POA: Diagnosis not present

## 2023-03-09 DIAGNOSIS — J3081 Allergic rhinitis due to animal (cat) (dog) hair and dander: Secondary | ICD-10-CM | POA: Diagnosis not present

## 2023-03-09 DIAGNOSIS — J301 Allergic rhinitis due to pollen: Secondary | ICD-10-CM | POA: Diagnosis not present

## 2023-03-18 DIAGNOSIS — J3089 Other allergic rhinitis: Secondary | ICD-10-CM | POA: Diagnosis not present

## 2023-03-18 DIAGNOSIS — J3081 Allergic rhinitis due to animal (cat) (dog) hair and dander: Secondary | ICD-10-CM | POA: Diagnosis not present

## 2023-03-18 DIAGNOSIS — J301 Allergic rhinitis due to pollen: Secondary | ICD-10-CM | POA: Diagnosis not present

## 2023-03-23 DIAGNOSIS — J301 Allergic rhinitis due to pollen: Secondary | ICD-10-CM | POA: Diagnosis not present

## 2023-03-23 DIAGNOSIS — J3081 Allergic rhinitis due to animal (cat) (dog) hair and dander: Secondary | ICD-10-CM | POA: Diagnosis not present

## 2023-03-23 DIAGNOSIS — J3089 Other allergic rhinitis: Secondary | ICD-10-CM | POA: Diagnosis not present

## 2023-03-29 DIAGNOSIS — J301 Allergic rhinitis due to pollen: Secondary | ICD-10-CM | POA: Diagnosis not present

## 2023-03-29 DIAGNOSIS — J3089 Other allergic rhinitis: Secondary | ICD-10-CM | POA: Diagnosis not present

## 2023-03-29 DIAGNOSIS — J3081 Allergic rhinitis due to animal (cat) (dog) hair and dander: Secondary | ICD-10-CM | POA: Diagnosis not present

## 2023-04-03 ENCOUNTER — Other Ambulatory Visit: Payer: Self-pay | Admitting: Family Medicine

## 2023-04-06 DIAGNOSIS — J3081 Allergic rhinitis due to animal (cat) (dog) hair and dander: Secondary | ICD-10-CM | POA: Diagnosis not present

## 2023-04-06 DIAGNOSIS — J301 Allergic rhinitis due to pollen: Secondary | ICD-10-CM | POA: Diagnosis not present

## 2023-04-06 DIAGNOSIS — J3089 Other allergic rhinitis: Secondary | ICD-10-CM | POA: Diagnosis not present

## 2023-04-10 ENCOUNTER — Other Ambulatory Visit: Payer: Self-pay | Admitting: Family Medicine

## 2023-04-10 DIAGNOSIS — G629 Polyneuropathy, unspecified: Secondary | ICD-10-CM

## 2023-04-12 DIAGNOSIS — J3081 Allergic rhinitis due to animal (cat) (dog) hair and dander: Secondary | ICD-10-CM | POA: Diagnosis not present

## 2023-04-12 DIAGNOSIS — J301 Allergic rhinitis due to pollen: Secondary | ICD-10-CM | POA: Diagnosis not present

## 2023-04-12 DIAGNOSIS — J3089 Other allergic rhinitis: Secondary | ICD-10-CM | POA: Diagnosis not present

## 2023-04-19 DIAGNOSIS — J3081 Allergic rhinitis due to animal (cat) (dog) hair and dander: Secondary | ICD-10-CM | POA: Diagnosis not present

## 2023-04-19 DIAGNOSIS — J301 Allergic rhinitis due to pollen: Secondary | ICD-10-CM | POA: Diagnosis not present

## 2023-04-19 DIAGNOSIS — J3089 Other allergic rhinitis: Secondary | ICD-10-CM | POA: Diagnosis not present

## 2023-04-26 DIAGNOSIS — J301 Allergic rhinitis due to pollen: Secondary | ICD-10-CM | POA: Diagnosis not present

## 2023-04-26 DIAGNOSIS — J3089 Other allergic rhinitis: Secondary | ICD-10-CM | POA: Diagnosis not present

## 2023-04-26 DIAGNOSIS — J3081 Allergic rhinitis due to animal (cat) (dog) hair and dander: Secondary | ICD-10-CM | POA: Diagnosis not present

## 2023-05-03 DIAGNOSIS — J3089 Other allergic rhinitis: Secondary | ICD-10-CM | POA: Diagnosis not present

## 2023-05-10 DIAGNOSIS — J3081 Allergic rhinitis due to animal (cat) (dog) hair and dander: Secondary | ICD-10-CM | POA: Diagnosis not present

## 2023-05-10 DIAGNOSIS — J3089 Other allergic rhinitis: Secondary | ICD-10-CM | POA: Diagnosis not present

## 2023-05-10 DIAGNOSIS — J301 Allergic rhinitis due to pollen: Secondary | ICD-10-CM | POA: Diagnosis not present

## 2023-05-17 DIAGNOSIS — J3081 Allergic rhinitis due to animal (cat) (dog) hair and dander: Secondary | ICD-10-CM | POA: Diagnosis not present

## 2023-05-17 DIAGNOSIS — J3089 Other allergic rhinitis: Secondary | ICD-10-CM | POA: Diagnosis not present

## 2023-05-17 DIAGNOSIS — J301 Allergic rhinitis due to pollen: Secondary | ICD-10-CM | POA: Diagnosis not present

## 2023-05-24 DIAGNOSIS — J3089 Other allergic rhinitis: Secondary | ICD-10-CM | POA: Diagnosis not present

## 2023-05-24 DIAGNOSIS — J301 Allergic rhinitis due to pollen: Secondary | ICD-10-CM | POA: Diagnosis not present

## 2023-05-24 DIAGNOSIS — J3081 Allergic rhinitis due to animal (cat) (dog) hair and dander: Secondary | ICD-10-CM | POA: Diagnosis not present

## 2023-05-31 DIAGNOSIS — J3081 Allergic rhinitis due to animal (cat) (dog) hair and dander: Secondary | ICD-10-CM | POA: Diagnosis not present

## 2023-05-31 DIAGNOSIS — J3089 Other allergic rhinitis: Secondary | ICD-10-CM | POA: Diagnosis not present

## 2023-05-31 DIAGNOSIS — J301 Allergic rhinitis due to pollen: Secondary | ICD-10-CM | POA: Diagnosis not present

## 2023-06-07 DIAGNOSIS — J3081 Allergic rhinitis due to animal (cat) (dog) hair and dander: Secondary | ICD-10-CM | POA: Diagnosis not present

## 2023-06-07 DIAGNOSIS — J301 Allergic rhinitis due to pollen: Secondary | ICD-10-CM | POA: Diagnosis not present

## 2023-06-07 DIAGNOSIS — J3089 Other allergic rhinitis: Secondary | ICD-10-CM | POA: Diagnosis not present

## 2023-06-14 DIAGNOSIS — J301 Allergic rhinitis due to pollen: Secondary | ICD-10-CM | POA: Diagnosis not present

## 2023-06-14 DIAGNOSIS — J3081 Allergic rhinitis due to animal (cat) (dog) hair and dander: Secondary | ICD-10-CM | POA: Diagnosis not present

## 2023-06-14 DIAGNOSIS — J3089 Other allergic rhinitis: Secondary | ICD-10-CM | POA: Diagnosis not present

## 2023-06-21 DIAGNOSIS — J3089 Other allergic rhinitis: Secondary | ICD-10-CM | POA: Diagnosis not present

## 2023-06-21 DIAGNOSIS — J301 Allergic rhinitis due to pollen: Secondary | ICD-10-CM | POA: Diagnosis not present

## 2023-06-21 DIAGNOSIS — J3081 Allergic rhinitis due to animal (cat) (dog) hair and dander: Secondary | ICD-10-CM | POA: Diagnosis not present

## 2023-06-28 DIAGNOSIS — J3081 Allergic rhinitis due to animal (cat) (dog) hair and dander: Secondary | ICD-10-CM | POA: Diagnosis not present

## 2023-06-28 DIAGNOSIS — J3089 Other allergic rhinitis: Secondary | ICD-10-CM | POA: Diagnosis not present

## 2023-06-28 DIAGNOSIS — J301 Allergic rhinitis due to pollen: Secondary | ICD-10-CM | POA: Diagnosis not present

## 2023-07-05 ENCOUNTER — Telehealth: Payer: Self-pay | Admitting: Family Medicine

## 2023-07-05 DIAGNOSIS — J301 Allergic rhinitis due to pollen: Secondary | ICD-10-CM | POA: Diagnosis not present

## 2023-07-05 DIAGNOSIS — J3089 Other allergic rhinitis: Secondary | ICD-10-CM | POA: Diagnosis not present

## 2023-07-05 DIAGNOSIS — J3081 Allergic rhinitis due to animal (cat) (dog) hair and dander: Secondary | ICD-10-CM | POA: Diagnosis not present

## 2023-07-05 NOTE — Telephone Encounter (Signed)
Prescription Request  07/05/2023  LOV: 10/20/2022  What is the name of the medication or equipment?    Have you contacted your pharmacy to request a refill? No   Which pharmacy would you like this sent to?   [id number] - [place], [place] - [address] AT [facility] [address] [place] [address] Phone: [phone] Fax: [phone]  Patient notified that their request is being sent to the clinical staff for review and that they should receive a response within 2 business days.   Please advise at Mobile [phone] (mobile)

## 2023-07-07 NOTE — Telephone Encounter (Signed)
Pt used this Rx on 06/03/22, requests new Rx. Please advise

## 2023-07-08 MED ORDER — KETOCONAZOLE 2 % EX CREA: 1.0000 | TOPICAL_CREAM | Freq: Two times a day (BID) | CUTANEOUS | 5 refills | Status: AC | PRN

## 2023-07-08 NOTE — Telephone Encounter (Signed)
I sent this in

## 2023-07-13 DIAGNOSIS — J3089 Other allergic rhinitis: Secondary | ICD-10-CM | POA: Diagnosis not present

## 2023-07-13 DIAGNOSIS — J3081 Allergic rhinitis due to animal (cat) (dog) hair and dander: Secondary | ICD-10-CM | POA: Diagnosis not present

## 2023-07-13 DIAGNOSIS — J301 Allergic rhinitis due to pollen: Secondary | ICD-10-CM | POA: Diagnosis not present

## 2023-07-26 DIAGNOSIS — J3081 Allergic rhinitis due to animal (cat) (dog) hair and dander: Secondary | ICD-10-CM | POA: Diagnosis not present

## 2023-07-26 DIAGNOSIS — J301 Allergic rhinitis due to pollen: Secondary | ICD-10-CM | POA: Diagnosis not present

## 2023-07-26 DIAGNOSIS — J3089 Other allergic rhinitis: Secondary | ICD-10-CM | POA: Diagnosis not present

## 2023-08-02 ENCOUNTER — Other Ambulatory Visit: Payer: Self-pay

## 2023-08-02 DIAGNOSIS — G629 Polyneuropathy, unspecified: Secondary | ICD-10-CM

## 2023-08-02 DIAGNOSIS — J3089 Other allergic rhinitis: Secondary | ICD-10-CM | POA: Diagnosis not present

## 2023-08-02 DIAGNOSIS — J3081 Allergic rhinitis due to animal (cat) (dog) hair and dander: Secondary | ICD-10-CM | POA: Diagnosis not present

## 2023-08-02 DIAGNOSIS — J301 Allergic rhinitis due to pollen: Secondary | ICD-10-CM | POA: Diagnosis not present

## 2023-08-02 MED ORDER — GABAPENTIN 100 MG PO CAPS: 100.0000 mg | ORAL_CAPSULE | Freq: Every day | ORAL | 2 refills | Status: DC

## 2023-08-08 ENCOUNTER — Other Ambulatory Visit: Payer: Self-pay | Admitting: Family Medicine

## 2023-08-08 DIAGNOSIS — K219 Gastro-esophageal reflux disease without esophagitis: Secondary | ICD-10-CM

## 2023-08-08 DIAGNOSIS — I10 Essential (primary) hypertension: Secondary | ICD-10-CM

## 2023-08-09 ENCOUNTER — Ambulatory Visit (INDEPENDENT_AMBULATORY_CARE_PROVIDER_SITE_OTHER): Payer: Medicare HMO

## 2023-08-09 DIAGNOSIS — Z23 Encounter for immunization: Secondary | ICD-10-CM | POA: Diagnosis not present

## 2023-08-09 DIAGNOSIS — J301 Allergic rhinitis due to pollen: Secondary | ICD-10-CM | POA: Diagnosis not present

## 2023-08-09 DIAGNOSIS — J3089 Other allergic rhinitis: Secondary | ICD-10-CM | POA: Diagnosis not present

## 2023-08-09 DIAGNOSIS — J3081 Allergic rhinitis due to animal (cat) (dog) hair and dander: Secondary | ICD-10-CM | POA: Diagnosis not present

## 2023-08-12 DIAGNOSIS — H04123 Dry eye syndrome of bilateral lacrimal glands: Secondary | ICD-10-CM | POA: Diagnosis not present

## 2023-08-12 DIAGNOSIS — Z135 Encounter for screening for eye and ear disorders: Secondary | ICD-10-CM | POA: Diagnosis not present

## 2023-08-12 DIAGNOSIS — H5213 Myopia, bilateral: Secondary | ICD-10-CM | POA: Diagnosis not present

## 2023-08-12 DIAGNOSIS — H52223 Regular astigmatism, bilateral: Secondary | ICD-10-CM | POA: Diagnosis not present

## 2023-08-12 DIAGNOSIS — H524 Presbyopia: Secondary | ICD-10-CM | POA: Diagnosis not present

## 2023-08-12 DIAGNOSIS — Z961 Presence of intraocular lens: Secondary | ICD-10-CM | POA: Diagnosis not present

## 2023-08-16 DIAGNOSIS — J3081 Allergic rhinitis due to animal (cat) (dog) hair and dander: Secondary | ICD-10-CM | POA: Diagnosis not present

## 2023-08-16 DIAGNOSIS — J3089 Other allergic rhinitis: Secondary | ICD-10-CM | POA: Diagnosis not present

## 2023-08-16 DIAGNOSIS — J301 Allergic rhinitis due to pollen: Secondary | ICD-10-CM | POA: Diagnosis not present

## 2023-08-18 DIAGNOSIS — M170 Bilateral primary osteoarthritis of knee: Secondary | ICD-10-CM | POA: Diagnosis not present

## 2023-08-18 DIAGNOSIS — J3089 Other allergic rhinitis: Secondary | ICD-10-CM | POA: Diagnosis not present

## 2023-08-23 DIAGNOSIS — J301 Allergic rhinitis due to pollen: Secondary | ICD-10-CM | POA: Diagnosis not present

## 2023-08-23 DIAGNOSIS — J3081 Allergic rhinitis due to animal (cat) (dog) hair and dander: Secondary | ICD-10-CM | POA: Diagnosis not present

## 2023-08-23 DIAGNOSIS — J3089 Other allergic rhinitis: Secondary | ICD-10-CM | POA: Diagnosis not present

## 2023-08-30 DIAGNOSIS — J301 Allergic rhinitis due to pollen: Secondary | ICD-10-CM | POA: Diagnosis not present

## 2023-08-30 DIAGNOSIS — J3081 Allergic rhinitis due to animal (cat) (dog) hair and dander: Secondary | ICD-10-CM | POA: Diagnosis not present

## 2023-08-30 DIAGNOSIS — J3089 Other allergic rhinitis: Secondary | ICD-10-CM | POA: Diagnosis not present

## 2023-09-06 DIAGNOSIS — J3081 Allergic rhinitis due to animal (cat) (dog) hair and dander: Secondary | ICD-10-CM | POA: Diagnosis not present

## 2023-09-06 DIAGNOSIS — J301 Allergic rhinitis due to pollen: Secondary | ICD-10-CM | POA: Diagnosis not present

## 2023-09-06 DIAGNOSIS — J3089 Other allergic rhinitis: Secondary | ICD-10-CM | POA: Diagnosis not present

## 2023-09-09 ENCOUNTER — Ambulatory Visit: Payer: Medicare HMO | Admitting: Family Medicine

## 2023-09-09 DIAGNOSIS — Z0000 Encounter for general adult medical examination without abnormal findings: Secondary | ICD-10-CM | POA: Diagnosis not present

## 2023-09-09 NOTE — Patient Instructions (Addendum)
I really enjoyed getting to talk with you today! I am available on Tuesdays and Thursdays for virtual visits if you have any questions or concerns, or if I can be of any further assistance.   CHECKLIST FROM ANNUAL WELLNESS VISIT:  -Follow up (please call to schedule if not scheduled after visit):   -yearly for annual wellness visit with primary care office  Here is a list of your preventive care/health maintenance measures and the plan for each if any are due:  PLAN For any measures below that may be due:   Health Maintenance  Topic Date Due   Hepatitis C Screening  Never done   Pneumonia Vaccine 62+ Years old (3 of 3 - PPSV23 or PCV20) 03/10/2021   COVID-19 Vaccine (4 - 2023-24 season) 09/25/2023 (Originally 07/11/2023)   MAMMOGRAM  10/27/2023   Medicare Annual Wellness (AWV)  09/08/2024   Colonoscopy  06/17/2026   DTaP/Tdap/Td (4 - Td or Tdap) 02/05/2032   INFLUENZA VACCINE  Completed   DEXA SCAN  Completed   Zoster Vaccines- Shingrix  Completed   HPV VACCINES  Aged Out    -See a dentist at least yearly  -Get your eyes checked and then per your eye specialist's recommendations  -Other issues addressed today:   -I have included below further information regarding a healthy whole foods based diet, physical activity guidelines for adults, stress management and opportunities for social connections. I hope you find this information useful.   -----------------------------------------------------------------------------------------------------------------------------------------------------------------------------------------------------------------------------------------------------------  NUTRITION: -eat real food: lots of colorful vegetables (half the plate) and fruits -5-7 servings of vegetables and fruits per day (fresh or steamed is best), exp. 2 servings of vegetables with lunch and dinner and 2 servings of fruit per day. Berries and greens such as kale and collards are great  choices.  -consume on a regular basis: whole grains (make sure first ingredient on label contains the word "whole"), fresh fruits, fish, nuts, seeds, healthy oils (such as olive oil, avocado oil, grape seed oil) -may eat small amounts of dairy and lean meat on occasion, but avoid processed meats such as ham, bacon, lunch meat, etc. -drink water -try to avoid fast food and pre-packaged foods, processed meat -most experts advise limiting sodium to < 2300mg  per day, should limit further is any chronic conditions such as high blood pressure, heart disease, diabetes, etc. The American Heart Association advised that < 1500mg  is is ideal -try to avoid foods that contain any ingredients with names you do not recognize  -try to avoid sugar/sweets (except for the natural sugar that occurs in fresh fruit) -try to avoid sweet drinks -try to avoid white rice, white bread, pasta (unless whole grain), white or yellow potatoes  EXERCISE GUIDELINES FOR ADULTS: -if you wish to increase your physical activity, do so gradually and with the approval of your doctor -STOP and seek medical care immediately if you have any chest pain, chest discomfort or trouble breathing when starting or increasing exercise  -move and stretch your body, legs, feet and arms when sitting for long periods -Physical activity guidelines for optimal health in adults: -least 150 minutes per week of aerobic exercise (can talk, but not sing) once approved by your doctor, 20-30 minutes of sustained activity or two 10 minute episodes of sustained activity every day.  -resistance training at least 2 days per week if approved by your doctor -balance exercises 3+ days per week:   Stand somewhere where you have something sturdy to hold onto if you lose balance.  1) lift up on toes, start with 5x per day and work up to 20x   2) stand and lift on leg straight out to the side so that foot is a few inches of the floor, start with 5x each side and work  up to 20x each side   3) stand on one foot, start with 5 seconds each side and work up to 20 seconds on each side  If you need ideas or help with getting more active:  -Silver sneakers https://tools.silversneakers.com  -Walk with a Doc: http://www.duncan-williams.com/  -try to include resistance (weight lifting/strength building) and balance exercises twice per week: or the following link for ideas: http://castillo-powell.com/  BuyDucts.dk  STRESS MANAGEMENT: -can try meditating, or just sitting quietly with deep breathing while intentionally relaxing all parts of your body for 5 minutes daily -if you need further help with stress, anxiety or depression please follow up with your primary doctor or contact the wonderful folks at [facility]: [phone]  SOCIAL CONNECTIONS: -options in [place] if you wish to engage in more social and exercise related activities:  -Silver sneakers https://tools.silversneakers.com  -Walk with a Doc: http://www.duncan-williams.com/  -Check out the [facility] Active Adults 50+ section on the [place] [employer] (hiking clubs, book clubs, cards and games, chess, exercise classes, aquatic classes and much more) - see the website for details: https://www.[place].gov/departments/parks-recreation/active-adults50  -YouTube has lots of exercise videos for different ages and abilities as well  -[facility] (a variety of indoor and outdoor inperson activities for adults). ([phone]. [address]  -Virtual Online Classes (a variety of topics): see seniorplanet.org or call [phone]  -consider volunteering at a school, hospice center, church, senior center or elsewhere

## 2023-09-09 NOTE — Progress Notes (Signed)
PATIENT CHECK-IN and HEALTH RISK ASSESSMENT QUESTIONNAIRE:  -completed by phone/video for upcoming Medicare Preventive Visit  Pre-Visit Check-in: 1)Vitals (height, wt, BP, etc) - record in vitals section for visit on day of visit Request home vitals (wt, BP, etc.) and enter into vitals, THEN update Vital Signs SmartPhrase below at the top of the HPI. See below.  2)Review and Update Medications, Allergies PMH, Surgeries, Social history in Epic 3)Hospitalizations in the last year with date/reason? No  4)Review and Update Care Team (patient's specialists) in Epic 5) Complete PHQ9 in Epic  6) Complete Fall Screening in Epic 7)Review all Health Maintenance Due and order under PCP if not done.  8)Medicare Wellness Questionnaire: Answer theses question about your habits: Do you drink alcohol? Yes If yes, how many drinks do you have a day?drink seagram, once a day.  Have you ever smoked?no  Quit date if applicable? N/a  How many packs a day do/did you smoke? N/a Do you use smokeless tobacco?n/a Do you use an illicit drugs?n/a Do you exercises? Yes IF so, what type and how many days/minutes per week?walk every day for 20 mins Are you sexually active? No Number of partners?n/a Typical breakfast:today had sausage gravy from [employer]. Sometimes would have muffin, some coffee ,or cereal Typical lunch: don't usually eat Typical dinner: pork chop, spaghetti, home cook.  Go out to eat at least once a wk Typical snacks:crackers, peanut butter, chips, pretzel  Beverages: Pepsi, vitamin water   Answer theses question about you: Can you perform most household chores?yes Do you find it hard to follow a conversation in a noisy room? Yes, has hearing aid Do you often ask people to speak up or repeat themselves?yes, has hearing aid Do you feel that you have a problem with memory?No Do you balance your checkbook and or bank acounts?Yes Do you feel safe at home? yes Last dentist visit?2-3 years ago. Got  false teeth. Do you need assistance with any of the following: No  Driving?  Feeding yourself?  Getting from bed to chair?  Getting to the toilet?  Bathing or showering?  Dressing yourself?  Managing money?  Climbing a flight of stairs  Preparing meals?  Do you have Advanced Directives in place (Living Will, Healthcare Power or Attorney)? Yes with Living Will   Last eye Exam and location?Dr. Harriette Bouillon this about a month ago, in [place].   Do you currently use prescribed or non-prescribed narcotic or opioid pain medications?No   Do you have a history or close family history of breast, ovarian, tubal or peritoneal cancer or a family member with BRCA (breast cancer susceptibility 1 and 2) gene mutations? No but reports her dad and mom had a colon cancer.    Request home vitals (wt, BP, etc.) and enter into vitals, THEN update Vital Signs SmartPhrase below at the top of the HPI. See below.   Nurse/Assistant Credentials/time stamp: Karpuih M./CMA/241pm   ----------------------------------------------------------------------------------------------------------------------------------------------------------------------------------------------------------------------  Because this visit was a virtual/telehealth visit, some criteria may be missing or patient reported. Any vitals not documented were not able to be obtained and vitals that have been documented are patient reported.    MEDICARE ANNUAL PREVENTIVE VISIT WITH PROVIDER: (Welcome to Medicare, initial annual wellness or annual wellness exam)  Virtual Visit via Video Note  I connected with [name] on 09/09/23 by a video enabled telemedicine application and verified that I am speaking with the correct person using two identifiers.  Location patient: home Location provider:work or home office Persons participating in  the virtual visit: patient, provider  Concerns and/or follow up today: stable, no concerns   See HM  section in Epic for other details of completed HM.    ROS: negative for report of fevers, unintentional weight loss, vision changes, vision loss, hearing loss or change, chest pain, sob, hemoptysis, melena, hematochezia, hematuria, falls, bleeding or bruising, thoughts of suicide or self harm, memory loss  Patient-completed extensive health risk assessment - reviewed and discussed with the patient: See Health Risk Assessment completed with patient prior to the visit either above or in recent phone note. This was reviewed in detailed with the patient today and appropriate recommendations, orders and referrals were placed as needed per Summary below and patient instructions.   Review of Medical History: -PMH, PSH, Family History and current specialty and care providers reviewed and updated and listed below   Patient Care Team: Nelwyn Salisbury, MD as PCP - General Huel Cote, MD as Consulting Physician (Obstetrics and Gynecology) Eileen Stanford, MD as Referring Physician (Allergy and Immunology) Glenford Peers, OD as Referring Physician (Optometry)   Past Medical History:  Diagnosis Date   Allergy    Asthma    sees Dr. Jetty Duhamel   Colon polyp 2007   Depression    Diverticulosis    GERD (gastroesophageal reflux disease)    Gynecological examination    sees Dr. Huel Cote   Hyperlipemia    Hypertension     Past Surgical History:  Procedure Laterality Date   CARPAL TUNNEL RELEASE Right 11/09/1978   COLONOSCOPY  06/17/2021   per Dr. Orvan Falconer, adenimatous polyps, repeat in 3 yrs   DILATION AND CURETTAGE OF UTERUS  11/10/2003   KNEE SURGERY      Social History   Socioeconomic History   Marital status: Married    Spouse name: Not on file   Number of children: 2   Years of education: Not on file   Highest education level: Not on file  Occupational History    Employer: [employer]  Tobacco Use   Smoking status: Never   Smokeless tobacco: Never  Vaping Use    Vaping status: Never Used  Substance and Sexual Activity   Alcohol use: Yes    Alcohol/week: 0.0 standard drinks of alcohol    Comment: occ   Drug use: No   Sexual activity: Not on file  Other Topics Concern   Not on file  Social History Narrative   Daily caffeine    Social Determinants of Health   Financial Resource Strain: Low Risk  (09/07/2022)   Overall Financial Resource Strain (CARDIA)    Difficulty of Paying Living Expenses: Not hard at all  Food Insecurity: No Food Insecurity (09/07/2022)   Hunger Vital Sign    Worried About Running Out of Food in the Last Year: Never true    Ran Out of Food in the Last Year: Never true  Transportation Needs: No Transportation Needs (09/07/2022)   PRAPARE - Administrator, Civil Service (Medical): No    Lack of Transportation (Non-Medical): No  Physical Activity: Sufficiently Active (09/07/2022)   Exercise Vital Sign    Days of Exercise per Week: 7 days    Minutes of Exercise per Session: 30 min  Stress: No Stress Concern Present (09/07/2022)   [employer] of Occupational Health - Occupational Stress Questionnaire    Feeling of Stress : Not at all  Social Connections: Moderately Integrated (09/04/2021)   Social Connection and Isolation Panel [NHANES]  Frequency of Communication with Friends and Family: Twice a week    Frequency of Social Gatherings with Friends and Family: Twice a week    Attends Religious Services: More than 4 times per year    Active Member of [employer] or Organizations: No    Attends Banker Meetings: Never    Marital Status: Married  Catering manager Violence: Not At Risk (09/04/2021)   Humiliation, Afraid, Rape, and Kick questionnaire    Fear of Current or Ex-Partner: No    Emotionally Abused: No    Physically Abused: No    Sexually Abused: No    Family History  Problem Relation Age of Onset   Colon cancer Mother 66   Asthma Mother    Colon cancer Father 71   Liver  cancer Father    Diabetes Sister    Throat cancer Brother    Colon cancer Maternal Aunt        not sure age of onset   Colon cancer Maternal Aunt        not sure age of onset   Pancreatic cancer Neg Hx    Rectal cancer Neg Hx    Stomach cancer Neg Hx    Allergic rhinitis Neg Hx    Angioedema Neg Hx    Eczema Neg Hx    Colon polyps Neg Hx    Esophageal cancer Neg Hx     Current Outpatient Medications on File Prior to Visit  Medication Sig Dispense Refill   albuterol (PROAIR HFA) 108 (90 Base) MCG/ACT inhaler INHALE 2 PUFFS INTO THE LUNGS EVERY 6 HOURS AS NEEDED FOR WHEEZING. (Patient taking differently: Inhale 2 puffs into the lungs every 6 (six) hours as needed for wheezing.) 3 Inhaler 3   aspirin 81 MG tablet Take 81 mg by mouth daily.     atorvastatin (LIPITOR) 10 MG tablet Take 1 tablet (10 mg total) by mouth daily. 90 tablet 3   budesonide-formoterol (SYMBICORT) 80-4.5 MCG/ACT inhaler 2 puffs then rinse mouth, twice daily maintenance inhaler (Patient taking differently: Inhale 2 puffs into the lungs in the morning and at bedtime.) 3 Inhaler 3   EPINEPHrine 0.3 mg/0.3 mL IJ SOAJ injection Use as directed for severe allergic reaction 2 Device 1   Flaxseed, Linseed, (FLAX SEED OIL) 1000 MG CAPS Take 1 capsule by mouth daily.     fluticasone (FLONASE) 50 MCG/ACT nasal spray USE 1 SPRAY IN EACH NOSTRIL TWICE DAILY 48 g 3   gabapentin (NEURONTIN) 100 MG capsule Take 1 capsule (100 mg total) by mouth at bedtime. 30 capsule 2   ketoconazole (NIZORAL) 2 % cream Apply 1 Application topically 2 (two) times daily as needed for irritation. 30 g 5   lisinopril-hydrochlorothiazide (ZESTORETIC) 20-12.5 MG tablet TAKE 1 TABLET EVERY DAY 90 tablet 3   omeprazole (PRILOSEC) 20 MG capsule TAKE 1 CAPSULE EVERY DAY 90 capsule 3   potassium chloride SA (KLOR-CON M) 20 MEQ tablet TAKE 1 TABLET EVERY DAY 90 tablet 3   pyridOXINE (VITAMIN B-6) 100 MG tablet Take 100 mg by mouth daily.     terbinafine  (LAMISIL) 250 MG tablet TAKE 1 TABLET EVERY DAY 90 tablet 3   vitamin B-12 (CYANOCOBALAMIN) 100 MCG tablet Take 100 mcg by mouth daily.     No current facility-administered medications on file prior to visit.    Allergies  Allergen Reactions   Doxycycline Other (See Comments)    Jittery        Physical Exam Vitals requested from  patient and listed below if patient had equipment and was able to obtain at home for this virtual visit: There were no vitals filed for this visit. Estimated body mass index is 35.2 kg/m as calculated from the following:   Height as of 10/20/22: 5' (1.524 m).   Weight as of 10/20/22: 180 lb 4 oz (81.8 kg).  EKG (optional): deferred due to virtual visit  GENERAL: alert, oriented, no acute distress detected, full vision exam deferred due to pandemic and/or virtual encounter  HEENT: atraumatic, conjunttiva clear, no obvious abnormalities on inspection of external nose and ears  NECK: normal movements of the head and neck  LUNGS: on inspection no signs of respiratory distress, breathing rate appears normal, no obvious gross SOB, gasping or wheezing  CV: no obvious cyanosis  MS: moves all visible extremities without noticeable abnormality  PSYCH/NEURO: pleasant and cooperative, no obvious depression or anxiety, speech and thought processing grossly intact, Cognitive function grossly intact  Flowsheet Row Office Visit from [facility]/09/2023 in [facility] [place]  PHQ-9 Total Score 1           09/09/2023    2:24 PM 10/20/2022    8:11 AM 09/07/2022    3:02 PM 06/16/2022   10:12 AM 02/09/2022    1:42 PM  Depression screen PHQ 2/9  Decreased Interest 0 0 0 0 2  Down, Depressed, Hopeless 1 0 0 2 1  PHQ - 2 Score 1 0 0 2 3  Altered sleeping 0 2  3 2   Tired, decreased energy 0 0  2 2  Change in appetite 0 0  0 0  Feeling bad or failure about yourself  0 0  0 1  Trouble concentrating 0 0  0 0  Moving slowly or fidgety/restless 0 0   0 0  Suicidal thoughts 0 0  0 0  PHQ-9 Score 1 2  7 8   Difficult doing work/chores Not difficult at all Not difficult at all  Not difficult at all Not difficult at all  Doing ok, step grandson shot himself. Doing ok now. She did not know him well.      06/04/2022   10:00 AM 06/16/2022   10:11 AM 09/07/2022    3:02 PM 09/09/2023   12:43 PM 09/09/2023    2:28 PM  Fall Risk  Falls in the past year?  0 0 0 0  Was there an injury with Fall?  0 0 0 0  Fall Risk Category Calculator  0 0 0 0  Fall Risk Category (Retired)  Low Low    (RETIRED) Patient Fall Risk Level High fall risk Moderate fall risk Low fall risk    Patient at Risk for Falls Due to  No Fall Risks Medication side effect  No Fall Risks  Fall risk Follow up  Falls evaluation completed Falls prevention discussed;Education provided;Falls evaluation completed  Falls evaluation completed     SUMMARY AND PLAN:  Encounter for Medicare annual wellness exam   Discussed applicable health maintenance/preventive health measures and advised and referred or ordered per patient preferences: -she plans to consider the pneumonia vax and get at the pharmacy if decides to get Health Maintenance  Topic Date Due   Hepatitis C Screening  Never done   Pneumonia Vaccine 75+ Years old (3 of 3 - PPSV23 or PCV20) 03/10/2021   COVID-19 Vaccine (4 - 2023-24 season) 09/25/2023 (Originally 07/11/2023)   MAMMOGRAM  10/27/2023   Medicare Annual Wellness (AWV)  09/08/2024  Colonoscopy  06/17/2026   DTaP/Tdap/Td (4 - Td or Tdap) 02/05/2032   INFLUENZA VACCINE  Completed   DEXA SCAN  Completed   Zoster Vaccines- Shingrix  Completed   HPV VACCINES  Aged [employer] and counseling on the following was provided based on the above review of health and a plan/checklist for the patient, along with additional information discussed, was provided for the patient in the patient instructions :   -Provided counseling and plan for difficulty hearing  -Advised  and counseled on a healthy lifestyle - including the importance of a healthy diet, regular physical activity, social connections  -Reviewed patient's current diet. Advised and counseled on a whole foods based healthy diet. A summary of a healthy diet was provided in the Patient Instructions. She is interested in losing weight - discussed foods to promote healthy metabolism and wt. Whole fods, plenty of veggies, whole grains instead of refined grains and avoidance of sweets and processed foods.  -reviewed patient's current physical activity level and discussed exercise guidelines for adults. Discussed community resources and ideas for safe exercise at home to assist in meeting exercise guideline recommendations in a safe and healthy way.  -Advise yearly dental visits at minimum and regular eye exams   Follow up: see patient instructions     Patient Instructions  I really enjoyed getting to talk with you today! I am available on Tuesdays and Thursdays for virtual visits if you have any questions or concerns, or if I can be of any further assistance.   CHECKLIST FROM ANNUAL WELLNESS VISIT:  -Follow up (please call to schedule if not scheduled after visit):   -yearly for annual wellness visit with primary care office  Here is a list of your preventive care/health maintenance measures and the plan for each if any are due:  PLAN For any measures below that may be due:   Health Maintenance  Topic Date Due   Hepatitis C Screening  Never done   Pneumonia Vaccine 35+ Years old (3 of 3 - PPSV23 or PCV20) 03/10/2021   COVID-19 Vaccine (4 - 2023-24 season) 09/25/2023 (Originally 07/11/2023)   MAMMOGRAM  10/27/2023   Medicare Annual Wellness (AWV)  09/08/2024   Colonoscopy  06/17/2026   DTaP/Tdap/Td (4 - Td or Tdap) 02/05/2032   INFLUENZA VACCINE  Completed   DEXA SCAN  Completed   Zoster Vaccines- Shingrix  Completed   HPV VACCINES  Aged Out    -See a dentist at least yearly  -Get your eyes  checked and then per your eye specialist's recommendations  -Other issues addressed today:   -I have included below further information regarding a healthy whole foods based diet, physical activity guidelines for adults, stress management and opportunities for social connections. I hope you find this information useful.   -----------------------------------------------------------------------------------------------------------------------------------------------------------------------------------------------------------------------------------------------------------  NUTRITION: -eat real food: lots of colorful vegetables (half the plate) and fruits -5-7 servings of vegetables and fruits per day (fresh or steamed is best), exp. 2 servings of vegetables with lunch and dinner and 2 servings of fruit per day. Berries and greens such as kale and collards are great choices.  -consume on a regular basis: whole grains (make sure first ingredient on label contains the word "whole"), fresh fruits, fish, nuts, seeds, healthy oils (such as olive oil, avocado oil, grape seed oil) -may eat small amounts of dairy and lean meat on occasion, but avoid processed meats such as ham, bacon, lunch meat, etc. -drink water -try to avoid fast food and  pre-packaged foods, processed meat -most experts advise limiting sodium to < 2300mg  per day, should limit further is any chronic conditions such as high blood pressure, heart disease, diabetes, etc. The American Heart Association advised that < 1500mg  is is ideal -try to avoid foods that contain any ingredients with names you do not recognize  -try to avoid sugar/sweets (except for the natural sugar that occurs in fresh fruit) -try to avoid sweet drinks -try to avoid white rice, white bread, pasta (unless whole grain), white or yellow potatoes  EXERCISE GUIDELINES FOR ADULTS: -if you wish to increase your physical activity, do so gradually and with the approval of  your doctor -STOP and seek medical care immediately if you have any chest pain, chest discomfort or trouble breathing when starting or increasing exercise  -move and stretch your body, legs, feet and arms when sitting for long periods -Physical activity guidelines for optimal health in adults: -least 150 minutes per week of aerobic exercise (can talk, but not sing) once approved by your doctor, 20-30 minutes of sustained activity or two 10 minute episodes of sustained activity every day.  -resistance training at least 2 days per week if approved by your doctor -balance exercises 3+ days per week:   Stand somewhere where you have something sturdy to hold onto if you lose balance.    1) lift up on toes, start with 5x per day and work up to 20x   2) stand and lift on leg straight out to the side so that foot is a few inches of the floor, start with 5x each side and work up to 20x each side   3) stand on one foot, start with 5 seconds each side and work up to 20 seconds on each side  If you need ideas or help with getting more active:  -Silver sneakers https://tools.silversneakers.com  -Walk with a Doc: http://www.duncan-williams.com/  -try to include resistance (weight lifting/strength building) and balance exercises twice per week: or the following link for ideas: http://castillo-powell.com/  BuyDucts.dk  STRESS MANAGEMENT: -can try meditating, or just sitting quietly with deep breathing while intentionally relaxing all parts of your body for 5 minutes daily -if you need further help with stress, anxiety or depression please follow up with your primary doctor or contact the wonderful folks at [facility]: [phone]  SOCIAL CONNECTIONS: -options in [place] if you wish to engage in more social and exercise related activities:  -Silver sneakers https://tools.silversneakers.com  -Walk  with a Doc: http://www.duncan-williams.com/  -Check out the [facility] Active Adults 50+ section on the [place] [employer] (hiking clubs, book clubs, cards and games, chess, exercise classes, aquatic classes and much more) - see the website for details: https://www.[place].gov/departments/parks-recreation/active-adults50  -YouTube has lots of exercise videos for different ages and abilities as well  -[facility] (a variety of indoor and outdoor inperson activities for adults). [phone]. [address].  -Virtual Online Classes (a variety of topics): see seniorplanet.org or call [phone]  -consider volunteering at a school, hospice center, church, senior center or elsewhere           Terressa Koyanagi, DO

## 2023-09-13 ENCOUNTER — Other Ambulatory Visit: Payer: Self-pay | Admitting: Family Medicine

## 2023-09-13 DIAGNOSIS — J3089 Other allergic rhinitis: Secondary | ICD-10-CM | POA: Diagnosis not present

## 2023-09-13 DIAGNOSIS — J4540 Moderate persistent asthma, uncomplicated: Secondary | ICD-10-CM | POA: Diagnosis not present

## 2023-09-13 DIAGNOSIS — J301 Allergic rhinitis due to pollen: Secondary | ICD-10-CM | POA: Diagnosis not present

## 2023-09-13 DIAGNOSIS — E785 Hyperlipidemia, unspecified: Secondary | ICD-10-CM

## 2023-09-13 DIAGNOSIS — J3081 Allergic rhinitis due to animal (cat) (dog) hair and dander: Secondary | ICD-10-CM | POA: Diagnosis not present

## 2023-09-20 DIAGNOSIS — J3081 Allergic rhinitis due to animal (cat) (dog) hair and dander: Secondary | ICD-10-CM | POA: Diagnosis not present

## 2023-09-20 DIAGNOSIS — J3089 Other allergic rhinitis: Secondary | ICD-10-CM | POA: Diagnosis not present

## 2023-09-20 DIAGNOSIS — J301 Allergic rhinitis due to pollen: Secondary | ICD-10-CM | POA: Diagnosis not present

## 2023-09-27 DIAGNOSIS — J3089 Other allergic rhinitis: Secondary | ICD-10-CM | POA: Diagnosis not present

## 2023-09-27 DIAGNOSIS — J301 Allergic rhinitis due to pollen: Secondary | ICD-10-CM | POA: Diagnosis not present

## 2023-09-27 DIAGNOSIS — J3081 Allergic rhinitis due to animal (cat) (dog) hair and dander: Secondary | ICD-10-CM | POA: Diagnosis not present

## 2023-10-04 DIAGNOSIS — J3089 Other allergic rhinitis: Secondary | ICD-10-CM | POA: Diagnosis not present

## 2023-10-04 DIAGNOSIS — J301 Allergic rhinitis due to pollen: Secondary | ICD-10-CM | POA: Diagnosis not present

## 2023-10-04 DIAGNOSIS — J3081 Allergic rhinitis due to animal (cat) (dog) hair and dander: Secondary | ICD-10-CM | POA: Diagnosis not present

## 2023-10-11 ENCOUNTER — Encounter: Payer: Self-pay | Admitting: Family Medicine

## 2023-10-11 ENCOUNTER — Ambulatory Visit (INDEPENDENT_AMBULATORY_CARE_PROVIDER_SITE_OTHER): Payer: Medicare HMO | Admitting: Family Medicine

## 2023-10-11 VITALS — BP 108/60 | HR 67 | Temp 98.2°F | Ht 60.0 in | Wt 185.3 lb

## 2023-10-11 DIAGNOSIS — R0981 Nasal congestion: Secondary | ICD-10-CM | POA: Diagnosis not present

## 2023-10-11 DIAGNOSIS — J301 Allergic rhinitis due to pollen: Secondary | ICD-10-CM | POA: Diagnosis not present

## 2023-10-11 DIAGNOSIS — R059 Cough, unspecified: Secondary | ICD-10-CM | POA: Diagnosis not present

## 2023-10-11 DIAGNOSIS — J3081 Allergic rhinitis due to animal (cat) (dog) hair and dander: Secondary | ICD-10-CM | POA: Diagnosis not present

## 2023-10-11 DIAGNOSIS — J3089 Other allergic rhinitis: Secondary | ICD-10-CM | POA: Diagnosis not present

## 2023-10-11 LAB — POC COVID19 BINAXNOW: SARS Coronavirus 2 Ag: NEGATIVE

## 2023-10-11 MED ORDER — AZITHROMYCIN 250 MG PO TABS: ORAL_TABLET | ORAL | 0 refills | Status: AC

## 2023-10-11 MED ORDER — BENZONATATE 100 MG PO CAPS: 100.0000 mg | ORAL_CAPSULE | Freq: Three times a day (TID) | ORAL | 0 refills | Status: DC | PRN

## 2023-10-11 NOTE — Progress Notes (Signed)
Established Patient Office Visit  Subjective   Patient ID: [name], female    DOB: [birth year]  Age: 72 y.o. MRN: [id number]  Chief Complaint  Patient presents with   Cough    Patient complains of productive cough, x5 days, Tried Mucinex    Nasal Congestion    Patient complains of nasal congestion, x5 days     HPI   [name] was seen as a work and with about 5-day history of nasal congestion and cough.  She states her husband currently has no symptoms.  Her cough has been the worst symptom.  She denies any fever.  No significant body aches.  Increasing nasal congestion with some yellowish discharge.  No headaches.  She has no chronic lung issues.  Non-smoker.  No known sick contacts.  Denies any nausea, vomiting, or diarrhea.  Past Medical History:  Diagnosis Date   Allergy    Asthma    sees Dr. Jetty Duhamel   Colon polyp 2007   Depression    Diverticulosis    GERD (gastroesophageal reflux disease)    Gynecological examination    sees Dr. Huel Cote   Hyperlipemia    Hypertension    Past Surgical History:  Procedure Laterality Date   CARPAL TUNNEL RELEASE Right 11/09/1978   COLONOSCOPY  06/17/2021   per Dr. Orvan Falconer, adenimatous polyps, repeat in 3 yrs   DILATION AND CURETTAGE OF UTERUS  11/10/2003   KNEE SURGERY      reports that she has never smoked. She has never used smokeless tobacco. She reports current alcohol use. She reports that she does not use drugs. family history includes Asthma in her mother; Colon cancer in her maternal aunt and maternal aunt; Colon cancer (age of onset: 74) in her mother; Colon cancer (age of onset: 27) in her father; Diabetes in her sister; Liver cancer in her father; Throat cancer in her brother. Allergies  Allergen Reactions   Doxycycline Other (See Comments)    Jittery    ' Review of Systems  Constitutional:  Negative for chills and fever.  HENT:  Positive for congestion. Negative for sinus pain.    Respiratory:  Positive for cough. Negative for shortness of breath and wheezing.   Cardiovascular:  Negative for chest pain.      Objective:     BP 108/60 (BP Location: Left Arm, Patient Position: Sitting, Cuff Size: Normal)   Pulse 67   Temp 98.2 F (36.8 C) (Oral)   Ht 5' (1.524 m)   Wt 185 lb 4.8 oz (84.1 kg)   SpO2 98%   BMI 36.19 kg/m  BP Readings from Last 3 Encounters:  10/11/23 108/60  10/20/22 110/70  06/16/22 118/68   Wt Readings from Last 3 Encounters:  10/11/23 185 lb 4.8 oz (84.1 kg)  10/20/22 180 lb 4 oz (81.8 kg)  09/07/22 168 lb (76.2 kg)      Physical Exam Vitals reviewed.  Constitutional:      General: She is not in acute distress.    Appearance: She is not ill-appearing or toxic-appearing.  HENT:     Right Ear: Tympanic membrane normal.     Left Ear: Tympanic membrane normal.     Mouth/Throat:     Mouth: Mucous membranes are moist.     Pharynx: Oropharynx is clear. No oropharyngeal exudate.  Cardiovascular:     Rate and Rhythm: Normal rate and regular rhythm.  Pulmonary:     Effort: Pulmonary effort is normal.  Breath sounds: Normal breath sounds. No wheezing or rales.  Musculoskeletal:     Cervical back: Neck supple.  Lymphadenopathy:     Cervical: No cervical adenopathy.  Neurological:     Mental Status: She is alert.      Results for orders placed or performed in visit on 10/11/23  POC COVID-19  Result Value Ref Range   SARS Coronavirus 2 Ag Negative Negative      The 10-year ASCVD risk score (Arnett DK, et al., 2019) is: 8.5%    Assessment & Plan:   Problem List Items Addressed This Visit   None Visit Diagnoses     Cough, unspecified type    -  Primary   Relevant Orders   POC COVID-19 (Completed)   Nasal congestion       Relevant Orders   POC COVID-19 (Completed)     5-day history of nasal congestion and cough.  Suspect acute bronchitis.  COVID test negative.  Patient nontoxic in appearance.  Low clinical  suspicion for pneumonia.  We explained that most bronchitis is viral. -Recommend she consider observation for a few days and Tessalon Perles 100 mg every 8 hours as needed for cough -Patient relates she always requires Zithromax to get over these and has difficulty fighting bronchitis on her own.  We did write for Zithromax for 5 days if she is continue to have productive cough with no improvement or any worsening symptoms  No follow-ups on file.    Stacy Peat, MD

## 2023-10-18 DIAGNOSIS — J3081 Allergic rhinitis due to animal (cat) (dog) hair and dander: Secondary | ICD-10-CM | POA: Diagnosis not present

## 2023-10-18 DIAGNOSIS — J3089 Other allergic rhinitis: Secondary | ICD-10-CM | POA: Diagnosis not present

## 2023-10-18 DIAGNOSIS — J301 Allergic rhinitis due to pollen: Secondary | ICD-10-CM | POA: Diagnosis not present

## 2023-10-25 DIAGNOSIS — J3081 Allergic rhinitis due to animal (cat) (dog) hair and dander: Secondary | ICD-10-CM | POA: Diagnosis not present

## 2023-10-25 DIAGNOSIS — J3089 Other allergic rhinitis: Secondary | ICD-10-CM | POA: Diagnosis not present

## 2023-10-25 DIAGNOSIS — J301 Allergic rhinitis due to pollen: Secondary | ICD-10-CM | POA: Diagnosis not present

## 2023-10-26 ENCOUNTER — Encounter: Payer: Self-pay | Admitting: Family Medicine

## 2023-10-26 ENCOUNTER — Ambulatory Visit (INDEPENDENT_AMBULATORY_CARE_PROVIDER_SITE_OTHER): Payer: Medicare HMO | Admitting: Family Medicine

## 2023-10-26 VITALS — BP 110/68 | HR 60 | Temp 98.2°F | Ht 60.0 in | Wt 183.0 lb

## 2023-10-26 DIAGNOSIS — K589 Irritable bowel syndrome without diarrhea: Secondary | ICD-10-CM | POA: Diagnosis not present

## 2023-10-26 DIAGNOSIS — I10 Essential (primary) hypertension: Secondary | ICD-10-CM | POA: Diagnosis not present

## 2023-10-26 DIAGNOSIS — K219 Gastro-esophageal reflux disease without esophagitis: Secondary | ICD-10-CM | POA: Diagnosis not present

## 2023-10-26 DIAGNOSIS — J4520 Mild intermittent asthma, uncomplicated: Secondary | ICD-10-CM | POA: Diagnosis not present

## 2023-10-26 DIAGNOSIS — R739 Hyperglycemia, unspecified: Secondary | ICD-10-CM

## 2023-10-26 DIAGNOSIS — G629 Polyneuropathy, unspecified: Secondary | ICD-10-CM | POA: Diagnosis not present

## 2023-10-26 DIAGNOSIS — F418 Other specified anxiety disorders: Secondary | ICD-10-CM

## 2023-10-26 DIAGNOSIS — D649 Anemia, unspecified: Secondary | ICD-10-CM

## 2023-10-26 LAB — CBC WITH DIFFERENTIAL/PLATELET
Basophils Absolute: 0 10*3/uL (ref 0.0–0.1)
Basophils Relative: 0.5 % (ref 0.0–3.0)
Eosinophils Absolute: 0.2 10*3/uL (ref 0.0–0.7)
Eosinophils Relative: 2 % (ref 0.0–5.0)
HCT: 38.7 % (ref 36.0–46.0)
Hemoglobin: 12.8 g/dL (ref 12.0–15.0)
Lymphocytes Relative: 25.4 % (ref 12.0–46.0)
Lymphs Abs: 2.1 10*3/uL (ref 0.7–4.0)
MCHC: 33.1 g/dL (ref 30.0–36.0)
MCV: 97.3 fL (ref 78.0–100.0)
Monocytes Absolute: 0.4 10*3/uL (ref 0.1–1.0)
Monocytes Relative: 4.7 % (ref 3.0–12.0)
Neutro Abs: 5.5 10*3/uL (ref 1.4–7.7)
Neutrophils Relative %: 67.4 % (ref 43.0–77.0)
Platelets: 212 10*3/uL (ref 150.0–400.0)
RBC: 3.98 Mil/uL (ref 3.87–5.11)
RDW: 14.3 % (ref 11.5–15.5)
WBC: 8.2 10*3/uL (ref 4.0–10.5)

## 2023-10-26 LAB — LIPID PANEL
Cholesterol: 184 mg/dL (ref 0–200)
HDL: 57.7 mg/dL (ref >39.00)
LDL Cholesterol: 89 mg/dL (ref 0–99)
NonHDL: 126.07
Total CHOL/HDL Ratio: 3
Triglycerides: 183 mg/dL — ABNORMAL HIGH (ref 0.0–149.0)
VLDL: 36.6 mg/dL (ref 0.0–40.0)

## 2023-10-26 LAB — BASIC METABOLIC PANEL
BUN: 12 mg/dL (ref 6–23)
CO2: 29 meq/L (ref 19–32)
Calcium: 9.4 mg/dL (ref 8.4–10.5)
Chloride: 106 meq/L (ref 96–112)
Creatinine, Ser: 0.83 mg/dL (ref 0.40–1.20)
GFR: 71.3 mL/min (ref >60.00)
Glucose, Bld: 100 mg/dL — ABNORMAL HIGH (ref 70–99)
Potassium: 4 meq/L (ref 3.5–5.1)
Sodium: 144 meq/L (ref 135–145)

## 2023-10-26 LAB — HEPATIC FUNCTION PANEL
ALT: 10 U/L (ref 0–35)
AST: 11 U/L (ref 0–37)
Albumin: 4 g/dL (ref 3.5–5.2)
Alkaline Phosphatase: 75 U/L (ref 39–117)
Bilirubin, Direct: 0.1 mg/dL (ref 0.0–0.3)
Total Bilirubin: 0.5 mg/dL (ref 0.2–1.2)
Total Protein: 6.4 g/dL (ref 6.0–8.3)

## 2023-10-26 LAB — HEMOGLOBIN A1C: Hgb A1c MFr Bld: 5.4 % (ref 4.6–6.5)

## 2023-10-26 LAB — TSH: TSH: 1.57 u[IU]/mL (ref 0.35–5.50)

## 2023-10-26 MED ORDER — GABAPENTIN 300 MG PO CAPS: 300.0000 mg | ORAL_CAPSULE | Freq: Three times a day (TID) | ORAL | 3 refills | Status: DC

## 2023-10-26 MED ORDER — METHYLPREDNISOLONE ACETATE 40 MG/ML IJ SUSP
40.0000 mg | Freq: Once | INTRAMUSCULAR | Status: AC
  Administered 2023-10-26: 40 mg via INTRAMUSCULAR

## 2023-10-26 MED ORDER — METHYLPREDNISOLONE ACETATE 80 MG/ML IJ SUSP
80.0000 mg | Freq: Once | INTRAMUSCULAR | Status: AC
  Administered 2023-10-26: 80 mg via INTRAMUSCULAR

## 2023-10-26 NOTE — Progress Notes (Addendum)
Subjective:    Patient ID: [name], female    DOB: 1951/01/10, 72 y.o.   MRN: [id number]  HPI Here to follow up on issues. Here only complaint today is chest congestion and wheezing for the past few weeks. She has a dry cough that comes and goes. No fever. She saw Dr. Caryl Never for this on 10-11-23, and he prescribed a Zpack and Benzonatate. These have not helped at all. She still uses her inhalers regularly. Otherwise her BP has been controlled. The tingling and burning in her feet at night used to respond to taking Gabapentin 100 mg at bedtime, but lately this has not been working so well. Her IBS and GERD are stable.    Review of Systems  Constitutional: Negative.   HENT: Negative.    Eyes: Negative.   Respiratory:  Positive for cough, chest tightness and wheezing.   Cardiovascular: Negative.   Gastrointestinal: Negative.   Genitourinary:  Negative for decreased urine volume, difficulty urinating, dyspareunia, dysuria, enuresis, flank pain, frequency, hematuria, pelvic pain and urgency.  Musculoskeletal: Negative.   Skin: Negative.   Neurological: Negative.  Negative for headaches.  Psychiatric/Behavioral: Negative.         Objective:   Physical Exam Constitutional:      General: She is not in acute distress.    Appearance: Normal appearance. She is well-developed.  HENT:     Head: Normocephalic and atraumatic.     Right Ear: External ear normal.     Left Ear: External ear normal.     Nose: Nose normal.     Mouth/Throat:     Pharynx: No oropharyngeal exudate.  Eyes:     General: No scleral icterus.    Conjunctiva/sclera: Conjunctivae normal.     Pupils: Pupils are equal, round, and reactive to light.  Neck:     Thyroid: No thyromegaly.     Vascular: No JVD.  Cardiovascular:     Rate and Rhythm: Normal rate and regular rhythm.     Pulses: Normal pulses.     Heart sounds: Normal heart sounds. No murmur heard.    No friction rub. No gallop.  Pulmonary:      Effort: Pulmonary effort is normal. No respiratory distress.     Breath sounds: Normal breath sounds. No wheezing or rales.  Chest:     Chest wall: No tenderness.  Abdominal:     General: Bowel sounds are normal. There is no distension.     Palpations: Abdomen is soft. There is no mass.     Tenderness: There is no abdominal tenderness. There is no guarding or rebound.  Musculoskeletal:        General: No tenderness. Normal range of motion.     Cervical back: Normal range of motion and neck supple.  Lymphadenopathy:     Cervical: No cervical adenopathy.  Skin:    General: Skin is warm and dry.     Findings: No erythema or rash.  Neurological:     General: No focal deficit present.     Mental Status: She is alert and oriented to person, place, and time.     Cranial Nerves: No cranial nerve deficit.     Motor: No abnormal muscle tone.     Coordination: Coordination normal.     Deep Tendon Reflexes: Reflexes are normal and symmetric. Reflexes normal.  Psychiatric:        Mood and Affect: Mood normal.        Behavior: Behavior normal.  Thought Content: Thought content normal.        Judgment: Judgment normal.           Assessment & Plan:  Her asthma has been flaring up recently, so we will give her a shot of DepoMedrol today. Her neuropathy has worsened a bit, so we will increase the bedtime Gabapentin to 300 mg. Her HTN is stable. Her IBS and GERD are stable. We will get fasting labs today for lipids, etc. We spent a total of ( 34  ) minutes reviewing records and discussing these issues.   Gershon Crane, MD

## 2023-10-26 NOTE — Addendum Note (Signed)
Addended by: Carola Rhine on: 10/26/2023 11:08 AM   Modules accepted: Orders

## 2023-11-01 DIAGNOSIS — J3089 Other allergic rhinitis: Secondary | ICD-10-CM | POA: Diagnosis not present

## 2023-11-01 DIAGNOSIS — J3081 Allergic rhinitis due to animal (cat) (dog) hair and dander: Secondary | ICD-10-CM | POA: Diagnosis not present

## 2023-11-08 DIAGNOSIS — J3081 Allergic rhinitis due to animal (cat) (dog) hair and dander: Secondary | ICD-10-CM | POA: Diagnosis not present

## 2023-11-08 DIAGNOSIS — J3089 Other allergic rhinitis: Secondary | ICD-10-CM | POA: Diagnosis not present

## 2023-11-08 DIAGNOSIS — J301 Allergic rhinitis due to pollen: Secondary | ICD-10-CM | POA: Diagnosis not present

## 2023-11-11 ENCOUNTER — Telehealth: Payer: Self-pay | Admitting: *Deleted

## 2023-11-11 NOTE — Telephone Encounter (Signed)
 Copied from CRM #[id number]. Topic: Clinical - Medical Advice >> Nov 11, 2023  9:21 AM Irine Seal wrote: Reason for CRM: Pts gabapentin dosage was increased, and she said it made her feel a little weird, and pt is scared. Pt would like  a callback [phone]

## 2023-11-12 NOTE — Telephone Encounter (Signed)
 Spoke with pt advised of Dr Clent Ridges recommendation, verbalized udnerstanding

## 2023-11-12 NOTE — Telephone Encounter (Signed)
 Tell her to decrease the Gabapentin to take 300 mg at bedtime only (not 3 times a day)

## 2023-11-15 DIAGNOSIS — J3089 Other allergic rhinitis: Secondary | ICD-10-CM | POA: Diagnosis not present

## 2023-11-15 DIAGNOSIS — J3081 Allergic rhinitis due to animal (cat) (dog) hair and dander: Secondary | ICD-10-CM | POA: Diagnosis not present

## 2023-11-15 DIAGNOSIS — J301 Allergic rhinitis due to pollen: Secondary | ICD-10-CM | POA: Diagnosis not present

## 2023-11-16 ENCOUNTER — Telehealth: Payer: Self-pay

## 2023-11-16 NOTE — Telephone Encounter (Signed)
 Copied from CRM [id number]. Topic: Clinical - Medication Question >> Nov 16, 2023  9:50 AM Brittany M wrote: Reason for CRM: Patient is taking 300mg  of Gabapentin  at bedtime once daily. She is waking up feeling dizzy and weird like she has been drinking and she does not like this feeling. Wanting to know if she can go back to a lower dosage. Please reach out. [phone]

## 2023-11-18 ENCOUNTER — Other Ambulatory Visit: Payer: Self-pay

## 2023-11-18 NOTE — Telephone Encounter (Signed)
**Note De-identified  [place] Obfuscation** Please advise 

## 2023-11-19 ENCOUNTER — Other Ambulatory Visit: Payer: Self-pay

## 2023-11-19 MED ORDER — GABAPENTIN 100 MG PO CAPS: 100.0000 mg | ORAL_CAPSULE | Freq: Every day | ORAL | 1 refills | Status: DC

## 2023-11-19 NOTE — Telephone Encounter (Signed)
 Spoke with pt advised of Dr Clent Ridges recommendation. New Rx for gabapentin 100 mg sent to pt pharmacy

## 2023-11-19 NOTE — Telephone Encounter (Signed)
 Stop the 300 mg dose of Gabapentin and call in 100 mg to take one at bedtime, #90 with one rf

## 2023-11-22 DIAGNOSIS — J3089 Other allergic rhinitis: Secondary | ICD-10-CM | POA: Diagnosis not present

## 2023-11-29 DIAGNOSIS — J3081 Allergic rhinitis due to animal (cat) (dog) hair and dander: Secondary | ICD-10-CM | POA: Diagnosis not present

## 2023-11-29 DIAGNOSIS — J3089 Other allergic rhinitis: Secondary | ICD-10-CM | POA: Diagnosis not present

## 2023-12-01 DIAGNOSIS — M25561 Pain in right knee: Secondary | ICD-10-CM | POA: Diagnosis not present

## 2023-12-06 DIAGNOSIS — J3089 Other allergic rhinitis: Secondary | ICD-10-CM | POA: Diagnosis not present

## 2023-12-13 DIAGNOSIS — J3089 Other allergic rhinitis: Secondary | ICD-10-CM | POA: Diagnosis not present

## 2023-12-13 DIAGNOSIS — J3081 Allergic rhinitis due to animal (cat) (dog) hair and dander: Secondary | ICD-10-CM | POA: Diagnosis not present

## 2023-12-13 DIAGNOSIS — J301 Allergic rhinitis due to pollen: Secondary | ICD-10-CM | POA: Diagnosis not present

## 2023-12-20 DIAGNOSIS — J3081 Allergic rhinitis due to animal (cat) (dog) hair and dander: Secondary | ICD-10-CM | POA: Diagnosis not present

## 2023-12-20 DIAGNOSIS — J3089 Other allergic rhinitis: Secondary | ICD-10-CM | POA: Diagnosis not present

## 2023-12-27 DIAGNOSIS — J3081 Allergic rhinitis due to animal (cat) (dog) hair and dander: Secondary | ICD-10-CM | POA: Diagnosis not present

## 2023-12-27 DIAGNOSIS — J3089 Other allergic rhinitis: Secondary | ICD-10-CM | POA: Diagnosis not present

## 2024-01-03 DIAGNOSIS — J3081 Allergic rhinitis due to animal (cat) (dog) hair and dander: Secondary | ICD-10-CM | POA: Diagnosis not present

## 2024-01-03 DIAGNOSIS — J3089 Other allergic rhinitis: Secondary | ICD-10-CM | POA: Diagnosis not present

## 2024-01-03 DIAGNOSIS — J301 Allergic rhinitis due to pollen: Secondary | ICD-10-CM | POA: Diagnosis not present

## 2024-01-07 DIAGNOSIS — J3089 Other allergic rhinitis: Secondary | ICD-10-CM | POA: Diagnosis not present

## 2024-01-10 DIAGNOSIS — J301 Allergic rhinitis due to pollen: Secondary | ICD-10-CM | POA: Diagnosis not present

## 2024-01-10 DIAGNOSIS — J3089 Other allergic rhinitis: Secondary | ICD-10-CM | POA: Diagnosis not present

## 2024-01-10 DIAGNOSIS — J3081 Allergic rhinitis due to animal (cat) (dog) hair and dander: Secondary | ICD-10-CM | POA: Diagnosis not present

## 2024-01-17 DIAGNOSIS — J3081 Allergic rhinitis due to animal (cat) (dog) hair and dander: Secondary | ICD-10-CM | POA: Diagnosis not present

## 2024-01-17 DIAGNOSIS — J3089 Other allergic rhinitis: Secondary | ICD-10-CM | POA: Diagnosis not present

## 2024-01-17 DIAGNOSIS — J301 Allergic rhinitis due to pollen: Secondary | ICD-10-CM | POA: Diagnosis not present

## 2024-01-23 ENCOUNTER — Other Ambulatory Visit: Payer: Self-pay | Admitting: Family Medicine

## 2024-01-24 DIAGNOSIS — J301 Allergic rhinitis due to pollen: Secondary | ICD-10-CM | POA: Diagnosis not present

## 2024-01-24 DIAGNOSIS — J3089 Other allergic rhinitis: Secondary | ICD-10-CM | POA: Diagnosis not present

## 2024-01-24 DIAGNOSIS — J3081 Allergic rhinitis due to animal (cat) (dog) hair and dander: Secondary | ICD-10-CM | POA: Diagnosis not present

## 2024-01-31 DIAGNOSIS — J3089 Other allergic rhinitis: Secondary | ICD-10-CM | POA: Diagnosis not present

## 2024-01-31 DIAGNOSIS — J3081 Allergic rhinitis due to animal (cat) (dog) hair and dander: Secondary | ICD-10-CM | POA: Diagnosis not present

## 2024-02-07 DIAGNOSIS — J3089 Other allergic rhinitis: Secondary | ICD-10-CM | POA: Diagnosis not present

## 2024-02-07 DIAGNOSIS — J3081 Allergic rhinitis due to animal (cat) (dog) hair and dander: Secondary | ICD-10-CM | POA: Diagnosis not present

## 2024-02-07 DIAGNOSIS — J301 Allergic rhinitis due to pollen: Secondary | ICD-10-CM | POA: Diagnosis not present

## 2024-02-14 DIAGNOSIS — J301 Allergic rhinitis due to pollen: Secondary | ICD-10-CM | POA: Diagnosis not present

## 2024-02-14 DIAGNOSIS — J3089 Other allergic rhinitis: Secondary | ICD-10-CM | POA: Diagnosis not present

## 2024-02-14 DIAGNOSIS — J3081 Allergic rhinitis due to animal (cat) (dog) hair and dander: Secondary | ICD-10-CM | POA: Diagnosis not present

## 2024-02-21 DIAGNOSIS — J3081 Allergic rhinitis due to animal (cat) (dog) hair and dander: Secondary | ICD-10-CM | POA: Diagnosis not present

## 2024-02-21 DIAGNOSIS — J3089 Other allergic rhinitis: Secondary | ICD-10-CM | POA: Diagnosis not present

## 2024-02-21 DIAGNOSIS — J301 Allergic rhinitis due to pollen: Secondary | ICD-10-CM | POA: Diagnosis not present

## 2024-02-28 DIAGNOSIS — J301 Allergic rhinitis due to pollen: Secondary | ICD-10-CM | POA: Diagnosis not present

## 2024-02-28 DIAGNOSIS — J3081 Allergic rhinitis due to animal (cat) (dog) hair and dander: Secondary | ICD-10-CM | POA: Diagnosis not present

## 2024-02-28 DIAGNOSIS — J3089 Other allergic rhinitis: Secondary | ICD-10-CM | POA: Diagnosis not present

## 2024-03-01 DIAGNOSIS — M25561 Pain in right knee: Secondary | ICD-10-CM | POA: Diagnosis not present

## 2024-03-02 ENCOUNTER — Ambulatory Visit: Payer: Self-pay

## 2024-03-02 NOTE — Telephone Encounter (Signed)
 Copied from CRM [id number]. Topic: Clinical - Red Word Triage >> Mar 02, 2024  9:42 AM Allison Arena wrote: [facility] that prompted transfer to Nurse Triage: Productive cough. Patient has asthma and her husband has pneumonia, patient questioning if she has pneumonia as well. Patient went to bed last night and her throat was sore and she felt phlegm in her throat and started coughing.   Chief Complaint: Cough, Productive Symptoms: Cough; Hoarse,   Frequency: Since last night  Pertinent Negatives: Patient denies fever, chest pain, dyspnea  Disposition: [] ED /[] Urgent Care (no appt availability in office) / [x] Appointment(In office/virtual)/ []  [facility]/ [] Home Care/ [] Refused Recommended Disposition /[] [place]/ []  Follow-up with PCP Additional Notes: RM is being triaged for a productive cough. The patient has been with her recently discharged husband whom was treated for pneumonia. Reports coughing up brownish sputum with no chest pain, dyspnea, or a fever. In office appointment made for tomorrow.   Reason for Disposition  Coughing up rusty-colored (reddish-brown) sputum  Answer Assessment - Initial Assessment Questions 1. ONSET: "When did the cough begin?"      Last Night  2. SEVERITY: "How bad is the cough today?"      intermittent  3. SPUTUM: "Describe the color of your sputum" (none, dry cough; clear, white, yellow, green)     Brownish to White  4. HEMOPTYSIS: "Are you coughing up any blood?" If so ask: "How much?" (flecks, streaks, tablespoons, etc.)     No  5. DIFFICULTY BREATHING: "Are you having difficulty breathing?" If Yes, ask: "How bad is it?" (e.g., mild, moderate, severe)    - MILD: No SOB at rest, mild SOB with walking, speaks normally in sentences, can lie down, no retractions, pulse < 100.    - MODERATE: SOB at rest, SOB with minimal exertion and prefers to sit, cannot lie down flat, speaks in phrases, mild retractions, audible wheezing, pulse  100-120.    - SEVERE: Very SOB at rest, speaks in single words, struggling to breathe, sitting hunched forward, retractions, pulse > 120       None   6. FEVER: "Do you have a fever?" If Yes, ask: "What is your temperature, how was it measured, and when did it start?"     No  7. CARDIAC HISTORY: "Do you have any history of heart disease?" (e.g., heart attack, congestive heart failure)      No  8. LUNG HISTORY: "Do you have any history of lung disease?"  (e.g., pulmonary embolus, asthma, emphysema)     Asthma  9. PE RISK FACTORS: "Do you have a history of blood clots?" (or: recent major surgery, recent prolonged travel, bedridden)     No  10. OTHER SYMPTOMS: "Do you have any other symptoms?" (e.g., runny nose, wheezing, chest pain)       Hoarse  11. PREGNANCY: "Is there any chance you are pregnant?" "When was your last menstrual period?"       No and No  12. TRAVEL: "Have you traveled out of the country in the last month?" (e.g., travel history, exposures)       Husband had pneumonia  Protocols used: Cough - Acute Productive-A-AH

## 2024-03-02 NOTE — Telephone Encounter (Signed)
 Pt has appointment with Dr Darren Em on 03/03/24

## 2024-03-03 ENCOUNTER — Ambulatory Visit (INDEPENDENT_AMBULATORY_CARE_PROVIDER_SITE_OTHER): Admitting: Family Medicine

## 2024-03-03 ENCOUNTER — Encounter: Payer: Self-pay | Admitting: Family Medicine

## 2024-03-03 VITALS — BP 134/68 | HR 54 | Temp 98.0°F | Wt 185.5 lb

## 2024-03-03 DIAGNOSIS — R059 Cough, unspecified: Secondary | ICD-10-CM | POA: Diagnosis not present

## 2024-03-03 NOTE — Progress Notes (Signed)
 Established Patient Office Visit  Subjective   Patient ID: [name], female    DOB: 08/29/1951  Age: 73 y.o. MRN: [id number]  Chief Complaint  Patient presents with   Cough    Patient complains of productive cough, x2 days, Tried Mucinex     HPI   73 year old female seen with approximately 2-day history of some productive cough.  She has had some nasal congestion.  Never smoked.  She states her husband was admitted to the hospital this past Monday and just discharged Wednesday.  He was admitted with pneumonia.  Her cough started the day he was discharged which is Wednesday.  She denies any fever.  No body aches.  No significant dyspnea.  No chronic lung problems.  She thinks pollen may be irritating her situation.  She has chronic allergies and sees allergist and is on immunotherapy for that.  Past Medical History:  Diagnosis Date   Allergy     Asthma    sees Dr. Rosa College   Colon polyp 2007   Depression    Diverticulosis    GERD (gastroesophageal reflux disease)    Gynecological examination    sees Dr. Rogene Claude   Hyperlipemia    Hypertension    Past Surgical History:  Procedure Laterality Date   CARPAL TUNNEL RELEASE Right 11/09/1978   COLONOSCOPY  06/17/2021   per Dr. Savannah Curlin, adenimatous polyps, repeat in 3 yrs   DILATION AND CURETTAGE OF UTERUS  11/10/2003   KNEE SURGERY      reports that she has never smoked. She has never used smokeless tobacco. She reports current alcohol use. She reports that she does not use drugs. family history includes Asthma in her mother; Colon cancer in her maternal aunt and maternal aunt; Colon cancer (age of onset: 43) in her mother; Colon cancer (age of onset: 60) in her father; Diabetes in her sister; Liver cancer in her father; Throat cancer in her brother. Allergies  Allergen Reactions   Doxycycline Other (See Comments)    Jittery     Review of Systems  Constitutional:  Negative for chills and fever.  HENT:   Negative for sinus pain and sore throat.   Respiratory:  Positive for cough and sputum production. Negative for hemoptysis, shortness of breath and wheezing.   Cardiovascular:  Negative for chest pain.      Objective:     BP 134/68 (BP Location: Left Arm, Cuff Size: Normal)   Pulse (!) 54   Temp 98 F (36.7 C) (Oral)   Wt 185 lb 8 oz (84.1 kg)   SpO2 98%   BMI 36.23 kg/m  BP Readings from Last 3 Encounters:  03/03/24 134/68  10/26/23 110/68  10/11/23 108/60   Wt Readings from Last 3 Encounters:  03/03/24 185 lb 8 oz (84.1 kg)  10/26/23 183 lb (83 kg)  10/11/23 185 lb 4.8 oz (84.1 kg)      Physical Exam Vitals reviewed.  Constitutional:      General: She is not in acute distress.    Appearance: She is not ill-appearing.  HENT:     Right Ear: Tympanic membrane normal.     Left Ear: Tympanic membrane normal.     Mouth/Throat:     Mouth: Mucous membranes are moist.     Pharynx: Oropharynx is clear. No oropharyngeal exudate or posterior oropharyngeal erythema.  Cardiovascular:     Rate and Rhythm: Normal rate and regular rhythm.  Pulmonary:     Effort: Pulmonary effort is  normal.     Breath sounds: Normal breath sounds. No wheezing or rales.  Neurological:     Mental Status: She is alert.      No results found for any visits on 03/03/24.    The 10-year ASCVD risk score (Arnett DK, et al., 2019) is: 14.8%    Assessment & Plan:   Problem List Items Addressed This Visit   None Visit Diagnoses       Cough, unspecified type    -  Primary     2-day history of cough.  Nonfocal exam.  No wheezes or rales.  Afebrile.  Suspect either early acute bronchitis versus allergic related cough.  She has significant pollen allergies.  Follow-up promptly for any fever, increased shortness of breath, or other concerns.  No follow-ups on file.    Glean Lamy, MD

## 2024-03-03 NOTE — Patient Instructions (Signed)
 Follow up for any fever or increased shortness of breath.

## 2024-03-06 DIAGNOSIS — J3081 Allergic rhinitis due to animal (cat) (dog) hair and dander: Secondary | ICD-10-CM | POA: Diagnosis not present

## 2024-03-06 DIAGNOSIS — J3089 Other allergic rhinitis: Secondary | ICD-10-CM | POA: Diagnosis not present

## 2024-03-13 DIAGNOSIS — J3089 Other allergic rhinitis: Secondary | ICD-10-CM | POA: Diagnosis not present

## 2024-03-13 DIAGNOSIS — J3081 Allergic rhinitis due to animal (cat) (dog) hair and dander: Secondary | ICD-10-CM | POA: Diagnosis not present

## 2024-03-20 DIAGNOSIS — J3089 Other allergic rhinitis: Secondary | ICD-10-CM | POA: Diagnosis not present

## 2024-03-20 DIAGNOSIS — J3081 Allergic rhinitis due to animal (cat) (dog) hair and dander: Secondary | ICD-10-CM | POA: Diagnosis not present

## 2024-04-04 DIAGNOSIS — J3089 Other allergic rhinitis: Secondary | ICD-10-CM | POA: Diagnosis not present

## 2024-04-04 DIAGNOSIS — J3081 Allergic rhinitis due to animal (cat) (dog) hair and dander: Secondary | ICD-10-CM | POA: Diagnosis not present

## 2024-04-10 DIAGNOSIS — J3089 Other allergic rhinitis: Secondary | ICD-10-CM | POA: Diagnosis not present

## 2024-04-10 DIAGNOSIS — J3081 Allergic rhinitis due to animal (cat) (dog) hair and dander: Secondary | ICD-10-CM | POA: Diagnosis not present

## 2024-04-10 DIAGNOSIS — J301 Allergic rhinitis due to pollen: Secondary | ICD-10-CM | POA: Diagnosis not present

## 2024-04-17 DIAGNOSIS — J3089 Other allergic rhinitis: Secondary | ICD-10-CM | POA: Diagnosis not present

## 2024-04-17 DIAGNOSIS — J301 Allergic rhinitis due to pollen: Secondary | ICD-10-CM | POA: Diagnosis not present

## 2024-04-17 DIAGNOSIS — J3081 Allergic rhinitis due to animal (cat) (dog) hair and dander: Secondary | ICD-10-CM | POA: Diagnosis not present

## 2024-04-24 DIAGNOSIS — J3081 Allergic rhinitis due to animal (cat) (dog) hair and dander: Secondary | ICD-10-CM | POA: Diagnosis not present

## 2024-04-24 DIAGNOSIS — J3089 Other allergic rhinitis: Secondary | ICD-10-CM | POA: Diagnosis not present

## 2024-04-24 DIAGNOSIS — J301 Allergic rhinitis due to pollen: Secondary | ICD-10-CM | POA: Diagnosis not present

## 2024-05-01 DIAGNOSIS — J3089 Other allergic rhinitis: Secondary | ICD-10-CM | POA: Diagnosis not present

## 2024-05-08 DIAGNOSIS — J301 Allergic rhinitis due to pollen: Secondary | ICD-10-CM | POA: Diagnosis not present

## 2024-05-08 DIAGNOSIS — J3089 Other allergic rhinitis: Secondary | ICD-10-CM | POA: Diagnosis not present

## 2024-05-08 DIAGNOSIS — J3081 Allergic rhinitis due to animal (cat) (dog) hair and dander: Secondary | ICD-10-CM | POA: Diagnosis not present

## 2024-05-15 DIAGNOSIS — J3089 Other allergic rhinitis: Secondary | ICD-10-CM | POA: Diagnosis not present

## 2024-05-15 DIAGNOSIS — J3081 Allergic rhinitis due to animal (cat) (dog) hair and dander: Secondary | ICD-10-CM | POA: Diagnosis not present

## 2024-05-15 DIAGNOSIS — J301 Allergic rhinitis due to pollen: Secondary | ICD-10-CM | POA: Diagnosis not present

## 2024-05-22 DIAGNOSIS — J3089 Other allergic rhinitis: Secondary | ICD-10-CM | POA: Diagnosis not present

## 2024-05-22 DIAGNOSIS — J3081 Allergic rhinitis due to animal (cat) (dog) hair and dander: Secondary | ICD-10-CM | POA: Diagnosis not present

## 2024-05-22 DIAGNOSIS — J301 Allergic rhinitis due to pollen: Secondary | ICD-10-CM | POA: Diagnosis not present

## 2024-05-28 ENCOUNTER — Other Ambulatory Visit: Payer: Self-pay | Admitting: Family Medicine

## 2024-05-28 DIAGNOSIS — K219 Gastro-esophageal reflux disease without esophagitis: Secondary | ICD-10-CM

## 2024-05-28 DIAGNOSIS — I10 Essential (primary) hypertension: Secondary | ICD-10-CM

## 2024-05-29 DIAGNOSIS — J301 Allergic rhinitis due to pollen: Secondary | ICD-10-CM | POA: Diagnosis not present

## 2024-05-29 DIAGNOSIS — J3081 Allergic rhinitis due to animal (cat) (dog) hair and dander: Secondary | ICD-10-CM | POA: Diagnosis not present

## 2024-05-29 DIAGNOSIS — J3089 Other allergic rhinitis: Secondary | ICD-10-CM | POA: Diagnosis not present

## 2024-05-31 DIAGNOSIS — M25561 Pain in right knee: Secondary | ICD-10-CM | POA: Diagnosis not present

## 2024-06-02 ENCOUNTER — Other Ambulatory Visit: Payer: Self-pay

## 2024-06-02 DIAGNOSIS — E785 Hyperlipidemia, unspecified: Secondary | ICD-10-CM

## 2024-06-02 MED ORDER — ATORVASTATIN CALCIUM 10 MG PO TABS: 10.0000 mg | ORAL_TABLET | Freq: Every day | ORAL | 3 refills | Status: DC

## 2024-06-05 DIAGNOSIS — J301 Allergic rhinitis due to pollen: Secondary | ICD-10-CM | POA: Diagnosis not present

## 2024-06-05 DIAGNOSIS — J3081 Allergic rhinitis due to animal (cat) (dog) hair and dander: Secondary | ICD-10-CM | POA: Diagnosis not present

## 2024-06-05 DIAGNOSIS — J3089 Other allergic rhinitis: Secondary | ICD-10-CM | POA: Diagnosis not present

## 2024-06-12 DIAGNOSIS — J301 Allergic rhinitis due to pollen: Secondary | ICD-10-CM | POA: Diagnosis not present

## 2024-06-12 DIAGNOSIS — J3089 Other allergic rhinitis: Secondary | ICD-10-CM | POA: Diagnosis not present

## 2024-06-12 DIAGNOSIS — J3081 Allergic rhinitis due to animal (cat) (dog) hair and dander: Secondary | ICD-10-CM | POA: Diagnosis not present

## 2024-06-19 DIAGNOSIS — J3081 Allergic rhinitis due to animal (cat) (dog) hair and dander: Secondary | ICD-10-CM | POA: Diagnosis not present

## 2024-06-19 DIAGNOSIS — J301 Allergic rhinitis due to pollen: Secondary | ICD-10-CM | POA: Diagnosis not present

## 2024-06-19 DIAGNOSIS — J3089 Other allergic rhinitis: Secondary | ICD-10-CM | POA: Diagnosis not present

## 2024-06-26 DIAGNOSIS — J301 Allergic rhinitis due to pollen: Secondary | ICD-10-CM | POA: Diagnosis not present

## 2024-06-26 DIAGNOSIS — J3081 Allergic rhinitis due to animal (cat) (dog) hair and dander: Secondary | ICD-10-CM | POA: Diagnosis not present

## 2024-06-26 DIAGNOSIS — J3089 Other allergic rhinitis: Secondary | ICD-10-CM | POA: Diagnosis not present

## 2024-07-02 DIAGNOSIS — R059 Cough, unspecified: Secondary | ICD-10-CM | POA: Diagnosis not present

## 2024-07-02 DIAGNOSIS — J019 Acute sinusitis, unspecified: Secondary | ICD-10-CM | POA: Diagnosis not present

## 2024-07-04 ENCOUNTER — Ambulatory Visit: Payer: Self-pay

## 2024-07-04 NOTE — Telephone Encounter (Signed)
 Patient has appt 8/27

## 2024-07-04 NOTE — Telephone Encounter (Signed)
 FYI Only or Action Required?: FYI only for provider.  Patient was last seen in primary care on 03/03/2024 by Micheal Wolm ORN, MD.  Called Nurse Triage reporting Sinusitis.  Symptoms began several days ago.  Interventions attempted: OTC medications: Tylenol , Prescription medications: Augmentin , and Rest, hydration, or home remedies.  Symptoms are: unchanged.  Triage Disposition: See Physician Within 24 Hours  Patient/caregiver understands and will follow disposition?: Yes  Copied from CRM #[id number]. Topic: Clinical - Red Word Triage >> Jul 04, 2024  8:59 AM [name] wrote: Red Word that prompted transfer to Nurse Triage: Sinus infection - white yellow phlegm, cough, head congestion    ----------------------------------------------------------------------- From previous Reason for Contact - Scheduling: Patient/patient representative is calling to schedule an appointment. Refer to attachments for appointment information. Reason for Disposition  [1] Taking antibiotic > 72 hours (3 days) AND [2] sinus pain not improved  Answer Assessment - Initial Assessment Questions 1. ANTIBIOTIC: What antibiotic are you taking? How many times a day?     Augmentin   2. ONSET: When was the antibiotic started?     2 days ago  3. PAIN: How bad is the pain?   (Scale 0-10; or none, mild, moderate or severe)     No pain  4. FEVER: Do you have a fever? If Yes, ask: What is it, how was it measured, and when did it start?      No fever  5. SYMPTOMS: Are there any other symptoms you're concerned about? If Yes, ask: When did it start?     Cough and sinus congestion  6. PREGNANCY: Is there any chance you are pregnant? When was your last menstrual period?     NO  Answer Assessment - Initial Assessment Questions 1. LOCATION: Where does it hurt?      General headache  2. ONSET: When did the sinus pain start?  (e.g., hours, days)      3 days ago  3. SEVERITY: How bad is the  pain?   (Scale 0-10; or none, mild, moderate or severe)     No pain at this time  4. RECURRENT SYMPTOM: Have you ever had sinus problems before? If Yes, ask: When was the last time? and What happened that time?      Yes, has had sinus infections in the past, usually treated with steroid  5. NASAL CONGESTION: Is the nose blocked? If Yes, ask: Can you open it or must you breathe through your mouth?     Yes  6. NASAL DISCHARGE: Do you have discharge from your nose? If so ask, What color?     Pt says that she is clearing yellow-white mucus  7. FEVER: Do you have a fever? If Yes, ask: What is it, how was it measured, and when did it start?      No  8. OTHER SYMPTOMS: Do you have any other symptoms? (e.g., sore throat, cough, earache, difficulty breathing)     Dry hacking cough  9. PREGNANCY: Is there any chance you are pregnant? When was your last menstrual period?     No  FYI: Seen at [facility] 2 days and started on Augmentin , but not feeling better  Protocols used: Sinus Pain or Congestion-A-AH, Sinus Infection on Antibiotic Follow-up Call-A-AH

## 2024-07-05 ENCOUNTER — Ambulatory Visit (INDEPENDENT_AMBULATORY_CARE_PROVIDER_SITE_OTHER): Admitting: Family Medicine

## 2024-07-05 VITALS — BP 136/62 | HR 70 | Temp 98.5°F | Ht 60.0 in | Wt 186.2 lb

## 2024-07-05 DIAGNOSIS — J0100 Acute maxillary sinusitis, unspecified: Secondary | ICD-10-CM | POA: Diagnosis not present

## 2024-07-05 DIAGNOSIS — J069 Acute upper respiratory infection, unspecified: Secondary | ICD-10-CM | POA: Diagnosis not present

## 2024-07-05 MED ORDER — BENZONATATE 100 MG PO CAPS: 100.0000 mg | ORAL_CAPSULE | Freq: Two times a day (BID) | ORAL | 0 refills | Status: DC | PRN

## 2024-07-05 NOTE — Patient Instructions (Addendum)
 You can use over-the-counter Mucinex  HBP or Coricidin HBP to help manage your symptoms.  Continue using your inhalers, nasal rinse, etc.  A prescription for Tessalon  was sent to your pharmacy for cough.  Continue taking the current antibiotics.  For worsening symptoms notify clinic.

## 2024-07-05 NOTE — Progress Notes (Signed)
 Established Patient Office Visit   Subjective  Patient ID: [name], female    DOB: April 13, 1951  Age: 73 y.o. MRN: [id number]  Chief Complaint  Patient presents with   Acute Visit    Patient came in today for a sinus infection that started a week ago, patient was seen in the UC and given Amoxicillin -clav, patient is still having congestion ear pressure, runny nose, cough    Patient is a 73 year old female followed by Dr. Johnny and seen for acute concern.  Patient endorses cough and cold symptoms x 1 week.  Seen at UC 3 days ago.  Given Augmentin  875-125 twice daily x 10 days.  Pt endorses continued dry cough, rhinorrhea, ears feeling stopped up.  Denies ear pain, HA, fever, chills, nausea, vomiting.  Patient did have a few loose stools on starting ABX and is since resolved.  Having less facial pain/pressure since starting antibiotic.  Concerned symptoms have not fully resolved as going on vacation next week.    Patient Active Problem List   Diagnosis Date Noted   Anxiety with depression 10/20/2022   Vertigo 06/16/2022   Hearing loss 06/16/2022   Tinnitus 06/16/2022   Morbid obesity (HCC) 06/04/2022   UTI (urinary tract infection) 06/03/2022   Rectal bleeding 06/03/2022   Hemorrhoids 06/03/2022   Normocytic anemia 06/03/2022   Hypokalemia 06/03/2022   Hypomagnesemia 06/03/2022   Hypocalcemia 06/03/2022   Hypophosphatemia 06/03/2022   Hyperglycemia 06/03/2022   Neuropathy 10/20/2021   Cataracts, bilateral 03/21/2018   IBS (irritable bowel syndrome) 03/10/2016   Allergic rhinitis due to pollen 01/12/2011   Lightheadedness 02/08/2008   ARM NUMBNESS 02/08/2008   Headache 02/08/2008   Essential hypertension 05/23/2007   Mild intermittent asthma 05/23/2007   GERD 05/23/2007   Past Medical History:  Diagnosis Date   Allergy     Asthma    sees Dr. Reggy Salt   Colon polyp 2007   Depression    Diverticulosis    GERD (gastroesophageal reflux disease)     Gynecological examination    sees Dr. Nathanel Bunker   Hyperlipemia    Hypertension    Past Surgical History:  Procedure Laterality Date   CARPAL TUNNEL RELEASE Right 11/09/1978   COLONOSCOPY  06/17/2021   per Dr. Eda, adenimatous polyps, repeat in 3 yrs   DILATION AND CURETTAGE OF UTERUS  11/10/2003   KNEE SURGERY     Social History   Tobacco Use   Smoking status: Never   Smokeless tobacco: Never  Vaping Use   Vaping status: Never Used  Substance Use Topics   Alcohol use: Yes    Alcohol/week: 0.0 standard drinks of alcohol    Comment: occ   Drug use: No   Family History  Problem Relation Age of Onset   Colon cancer Mother 61   Asthma Mother    Colon cancer Father 93   Liver cancer Father    Diabetes Sister    Throat cancer Brother    Colon cancer Maternal Aunt        not sure age of onset   Colon cancer Maternal Aunt        not sure age of onset   Pancreatic cancer Neg Hx    Rectal cancer Neg Hx    Stomach cancer Neg Hx    Allergic rhinitis Neg Hx    Angioedema Neg Hx    Eczema Neg Hx    Colon polyps Neg Hx    Esophageal cancer Neg Hx  Allergies  Allergen Reactions   Doxycycline Other (See Comments)    Jittery     ROS Negative unless stated above    Objective:     BP 136/62 (BP Location: Left Arm, Patient Position: Sitting, Cuff Size: Normal)   Pulse 70   Temp 98.5 F (36.9 C) (Oral)   Ht 5' (1.524 m)   Wt 186 lb 3.2 oz (84.5 kg)   SpO2 98%   BMI 36.36 kg/m  BP Readings from Last 3 Encounters:  07/05/24 136/62  03/03/24 134/68  10/26/23 110/68   Wt Readings from Last 3 Encounters:  07/05/24 186 lb 3.2 oz (84.5 kg)  03/03/24 185 lb 8 oz (84.1 kg)  10/26/23 183 lb (83 kg)      Physical Exam Constitutional:      General: She is not in acute distress.    Appearance: Normal appearance.  HENT:     Head: Normocephalic and atraumatic.     Right Ear: Tympanic membrane normal. No middle ear effusion. There is no impacted cerumen.      Left Ear: Tympanic membrane normal.  No middle ear effusion. There is no impacted cerumen.     Nose: Nose normal.     Right Sinus: No maxillary sinus tenderness or frontal sinus tenderness.     Left Sinus: No maxillary sinus tenderness or frontal sinus tenderness.     Mouth/Throat:     Mouth: Mucous membranes are moist.     Pharynx: Postnasal drip present. No pharyngeal swelling or posterior oropharyngeal erythema.  Cardiovascular:     Rate and Rhythm: Normal rate and regular rhythm.     Heart sounds: Normal heart sounds. No murmur heard.    No gallop.  Pulmonary:     Effort: Pulmonary effort is normal. No respiratory distress.     Breath sounds: Normal breath sounds. No wheezing, rhonchi or rales.  Skin:    General: Skin is warm and dry.  Neurological:     Mental Status: She is alert and oriented to person, place, and time.        03/03/2024    3:37 PM 09/09/2023    2:24 PM 10/20/2022    8:11 AM  Depression screen PHQ 2/9  Decreased Interest 0 0 0  Down, Depressed, Hopeless 0 1 0  PHQ - 2 Score 0 1 0  Altered sleeping 1 0 2  Tired, decreased energy 0 0 0  Change in appetite 0 0 0  Feeling bad or failure about yourself  0 0 0  Trouble concentrating 0 0 0  Moving slowly or fidgety/restless 0 0 0  Suicidal thoughts 0 0 0  PHQ-9 Score 1 1 2   Difficult doing work/chores Not difficult at all Not difficult at all Not difficult at all      03/12/2021   10:18 AM  GAD 7 : Generalized Anxiety Score  Nervous, Anxious, on Edge 1  Control/stop worrying 2  Worry too much - different things 2  Trouble relaxing 1  Restless 0  Easily annoyed or irritable 2  Afraid - awful might happen 2  Total GAD 7 Score 10  Anxiety Difficulty Somewhat difficult     No results found for any visits on 07/05/24.    Assessment & Plan:   Acute maxillary sinusitis, recurrence not specified  Viral URI with cough -     Benzonatate ; Take 1 capsule (100 mg total) by mouth 2 (two) times daily  as needed for cough.  Dispense: 20 capsule; Refill:  0  Seen for follow-up on URI/sinusitis.  Advised to continue course of ABX recently started some improvement in symptoms.  Advised likely duration of symptoms.  Advised to continue supportive care with OTC Coricidin HBP or Mucinex  HBP.  Also consider saline nasal rinse.  Tessalon  sent to pharmacy for cough.  Given precautions.  Return if symptoms worsen or fail to improve.   Clotilda JONELLE Single, MD

## 2024-07-25 DIAGNOSIS — J3089 Other allergic rhinitis: Secondary | ICD-10-CM | POA: Diagnosis not present

## 2024-07-28 ENCOUNTER — Other Ambulatory Visit: Payer: Self-pay | Admitting: Family Medicine

## 2024-07-28 DIAGNOSIS — I10 Essential (primary) hypertension: Secondary | ICD-10-CM

## 2024-07-28 DIAGNOSIS — K219 Gastro-esophageal reflux disease without esophagitis: Secondary | ICD-10-CM

## 2024-07-28 DIAGNOSIS — E785 Hyperlipidemia, unspecified: Secondary | ICD-10-CM

## 2024-07-28 NOTE — Telephone Encounter (Signed)
 Copied from CRM [id number]. Topic: Clinical - Medication Refill >> Jul 28, 2024 11:58 AM Shereese L wrote: Medication: atorvastatin  (LIPITOR) 10 MG tablet omeprazole  (PRILOSEC) 20 MG capsule terbinafine  (LAMISIL ) 250 MG tablet  potassium chloride  SA (KLOR-CON  M) 20 MEQ tablet lisinopril -hydrochlorothiazide  (ZESTORETIC ) 20-12.5 MG tablet budesonide -formoterol  (SYMBICORT ) 80-4.5 MCG/ACT inhaler   Has the patient contacted their pharmacy? Yes (Agent: If no, request that the patient contact the pharmacy for the refill. If patient does not wish to contact the pharmacy document the reason why and proceed with request.) (Agent: If yes, when and what did the pharmacy advise?)  This is the patient's preferred pharmacy:    [facility] - [place], [place] - [address] [place] [address] Phone: [phone] Fax: [phone]    Is this the correct pharmacy for this prescription? Yes If no, delete pharmacy and type the correct one.   Has the prescription been filled recently? Yes  Is the patient out of the medication? No  Has the patient been seen for an appointment in the last year OR does the patient have an upcoming appointment? Yes  Can we respond through MyChart? Yes  Agent: Please be advised that Rx refills may take up to 3 business days. We ask that you follow-up with your pharmacy.

## 2024-07-28 NOTE — Telephone Encounter (Signed)
 Copied from CRM [id number]. Topic: Clinical - Medication Refill >> Jul 28, 2024 12:02 PM Shereese L wrote: Medication:  gabapentin  (NEURONTIN ) 100 MG capsule   Has the patient contacted their pharmacy? Yes (Agent: If no, request that the patient contact the pharmacy for the refill. If patient does not wish to contact the pharmacy document the reason why and proceed with request.) (Agent: If yes, when and what did the pharmacy advise?)  This is the patient's preferred pharmacy:   [id number] - [place], [place] - [address] [place] [address] Phone: [phone] Fax: [phone]   Is this the correct pharmacy for this prescription? Yes If no, delete pharmacy and type the correct one.   Has the prescription been filled recently? Yes  Is the patient out of the medication? Yes  Has the patient been seen for an appointment in the last year OR does the patient have an upcoming appointment? Yes  Can we respond through MyChart? Yes  Agent: Please be advised that Rx refills may take up to 3 business days. We ask that you follow-up with your pharmacy.

## 2024-07-31 DIAGNOSIS — J3081 Allergic rhinitis due to animal (cat) (dog) hair and dander: Secondary | ICD-10-CM | POA: Diagnosis not present

## 2024-07-31 DIAGNOSIS — J3089 Other allergic rhinitis: Secondary | ICD-10-CM | POA: Diagnosis not present

## 2024-07-31 DIAGNOSIS — J301 Allergic rhinitis due to pollen: Secondary | ICD-10-CM | POA: Diagnosis not present

## 2024-07-31 MED ORDER — LISINOPRIL-HYDROCHLOROTHIAZIDE 20-12.5 MG PO TABS: 1.0000 | ORAL_TABLET | Freq: Every day | ORAL | 3 refills | Status: DC

## 2024-07-31 MED ORDER — BUDESONIDE-FORMOTEROL FUMARATE 80-4.5 MCG/ACT IN AERO: INHALATION_SPRAY | RESPIRATORY_TRACT | 3 refills | Status: AC

## 2024-07-31 MED ORDER — ATORVASTATIN CALCIUM 10 MG PO TABS: 10.0000 mg | ORAL_TABLET | Freq: Every day | ORAL | 3 refills | Status: AC

## 2024-07-31 MED ORDER — GABAPENTIN 100 MG PO CAPS: 100.0000 mg | ORAL_CAPSULE | Freq: Every day | ORAL | 1 refills | Status: AC

## 2024-07-31 MED ORDER — TERBINAFINE HCL 250 MG PO TABS: 250.0000 mg | ORAL_TABLET | Freq: Every day | ORAL | 3 refills | Status: DC

## 2024-07-31 MED ORDER — POTASSIUM CHLORIDE CRYS ER 20 MEQ PO TBCR: 20.0000 meq | EXTENDED_RELEASE_TABLET | Freq: Every day | ORAL | 3 refills | Status: AC

## 2024-07-31 MED ORDER — OMEPRAZOLE 20 MG PO CPDR
20.0000 mg | DELAYED_RELEASE_CAPSULE | Freq: Every day | ORAL | 3 refills | Status: AC
Start: 2024-07-31 — End: ?

## 2024-08-03 DIAGNOSIS — J3089 Other allergic rhinitis: Secondary | ICD-10-CM | POA: Diagnosis not present

## 2024-08-07 DIAGNOSIS — J3081 Allergic rhinitis due to animal (cat) (dog) hair and dander: Secondary | ICD-10-CM | POA: Diagnosis not present

## 2024-08-07 DIAGNOSIS — J301 Allergic rhinitis due to pollen: Secondary | ICD-10-CM | POA: Diagnosis not present

## 2024-08-07 DIAGNOSIS — J3089 Other allergic rhinitis: Secondary | ICD-10-CM | POA: Diagnosis not present

## 2024-08-10 ENCOUNTER — Encounter: Admitting: Family Medicine

## 2024-08-14 DIAGNOSIS — J3089 Other allergic rhinitis: Secondary | ICD-10-CM | POA: Diagnosis not present

## 2024-08-14 DIAGNOSIS — J3081 Allergic rhinitis due to animal (cat) (dog) hair and dander: Secondary | ICD-10-CM | POA: Diagnosis not present

## 2024-08-14 DIAGNOSIS — J301 Allergic rhinitis due to pollen: Secondary | ICD-10-CM | POA: Diagnosis not present

## 2024-08-21 ENCOUNTER — Ambulatory Visit: Admitting: Family Medicine

## 2024-08-21 DIAGNOSIS — J301 Allergic rhinitis due to pollen: Secondary | ICD-10-CM | POA: Diagnosis not present

## 2024-08-21 DIAGNOSIS — J3089 Other allergic rhinitis: Secondary | ICD-10-CM | POA: Diagnosis not present

## 2024-08-21 DIAGNOSIS — J3081 Allergic rhinitis due to animal (cat) (dog) hair and dander: Secondary | ICD-10-CM | POA: Diagnosis not present

## 2024-08-28 DIAGNOSIS — J3081 Allergic rhinitis due to animal (cat) (dog) hair and dander: Secondary | ICD-10-CM | POA: Diagnosis not present

## 2024-08-28 DIAGNOSIS — J301 Allergic rhinitis due to pollen: Secondary | ICD-10-CM | POA: Diagnosis not present

## 2024-08-28 DIAGNOSIS — J3089 Other allergic rhinitis: Secondary | ICD-10-CM | POA: Diagnosis not present

## 2024-08-30 DIAGNOSIS — M25561 Pain in right knee: Secondary | ICD-10-CM | POA: Diagnosis not present

## 2024-09-04 DIAGNOSIS — J301 Allergic rhinitis due to pollen: Secondary | ICD-10-CM | POA: Diagnosis not present

## 2024-09-04 DIAGNOSIS — J3089 Other allergic rhinitis: Secondary | ICD-10-CM | POA: Diagnosis not present

## 2024-09-04 DIAGNOSIS — J3081 Allergic rhinitis due to animal (cat) (dog) hair and dander: Secondary | ICD-10-CM | POA: Diagnosis not present

## 2024-09-11 DIAGNOSIS — J301 Allergic rhinitis due to pollen: Secondary | ICD-10-CM | POA: Diagnosis not present

## 2024-09-11 DIAGNOSIS — J3081 Allergic rhinitis due to animal (cat) (dog) hair and dander: Secondary | ICD-10-CM | POA: Diagnosis not present

## 2024-09-11 DIAGNOSIS — J3089 Other allergic rhinitis: Secondary | ICD-10-CM | POA: Diagnosis not present

## 2024-09-19 DIAGNOSIS — J301 Allergic rhinitis due to pollen: Secondary | ICD-10-CM | POA: Diagnosis not present

## 2024-09-19 DIAGNOSIS — Z5181 Encounter for therapeutic drug level monitoring: Secondary | ICD-10-CM | POA: Diagnosis not present

## 2024-09-19 DIAGNOSIS — J3081 Allergic rhinitis due to animal (cat) (dog) hair and dander: Secondary | ICD-10-CM | POA: Diagnosis not present

## 2024-09-19 DIAGNOSIS — J4540 Moderate persistent asthma, uncomplicated: Secondary | ICD-10-CM | POA: Diagnosis not present

## 2024-09-19 DIAGNOSIS — J3089 Other allergic rhinitis: Secondary | ICD-10-CM | POA: Diagnosis not present

## 2024-09-22 ENCOUNTER — Ambulatory Visit

## 2024-09-25 DIAGNOSIS — J3089 Other allergic rhinitis: Secondary | ICD-10-CM | POA: Diagnosis not present

## 2024-09-25 DIAGNOSIS — J301 Allergic rhinitis due to pollen: Secondary | ICD-10-CM | POA: Diagnosis not present

## 2024-09-25 DIAGNOSIS — J3081 Allergic rhinitis due to animal (cat) (dog) hair and dander: Secondary | ICD-10-CM | POA: Diagnosis not present

## 2024-10-09 DIAGNOSIS — J3081 Allergic rhinitis due to animal (cat) (dog) hair and dander: Secondary | ICD-10-CM | POA: Diagnosis not present

## 2024-10-09 DIAGNOSIS — J301 Allergic rhinitis due to pollen: Secondary | ICD-10-CM | POA: Diagnosis not present

## 2024-10-09 DIAGNOSIS — J3089 Other allergic rhinitis: Secondary | ICD-10-CM | POA: Diagnosis not present

## 2024-10-17 DIAGNOSIS — J3089 Other allergic rhinitis: Secondary | ICD-10-CM | POA: Diagnosis not present

## 2024-10-26 ENCOUNTER — Ambulatory Visit: Payer: Self-pay | Admitting: Family Medicine

## 2024-10-26 ENCOUNTER — Encounter: Payer: Self-pay | Admitting: Family Medicine

## 2024-10-26 ENCOUNTER — Ambulatory Visit (INDEPENDENT_AMBULATORY_CARE_PROVIDER_SITE_OTHER): Admitting: Family Medicine

## 2024-10-26 VITALS — BP 124/70 | HR 68 | Temp 98.0°F | Ht 60.0 in | Wt 179.0 lb

## 2024-10-26 DIAGNOSIS — R739 Hyperglycemia, unspecified: Secondary | ICD-10-CM | POA: Diagnosis not present

## 2024-10-26 DIAGNOSIS — K219 Gastro-esophageal reflux disease without esophagitis: Secondary | ICD-10-CM | POA: Diagnosis not present

## 2024-10-26 DIAGNOSIS — G629 Polyneuropathy, unspecified: Secondary | ICD-10-CM

## 2024-10-26 DIAGNOSIS — I10 Essential (primary) hypertension: Secondary | ICD-10-CM | POA: Diagnosis not present

## 2024-10-26 DIAGNOSIS — Z860100 Personal history of colon polyps, unspecified: Secondary | ICD-10-CM

## 2024-10-26 DIAGNOSIS — T887XXA Unspecified adverse effect of drug or medicament, initial encounter: Secondary | ICD-10-CM | POA: Diagnosis not present

## 2024-10-26 DIAGNOSIS — E538 Deficiency of other specified B group vitamins: Secondary | ICD-10-CM

## 2024-10-26 LAB — CBC WITH DIFFERENTIAL/PLATELET
Basophils Absolute: 0.1 K/uL (ref 0.0–0.1)
Basophils Relative: 1.1 % (ref 0.0–3.0)
Eosinophils Absolute: 0.1 K/uL (ref 0.0–0.7)
Eosinophils Relative: 2 % (ref 0.0–5.0)
HCT: 35.9 % — ABNORMAL LOW (ref 36.0–46.0)
Hemoglobin: 12.2 g/dL (ref 12.0–15.0)
Lymphocytes Relative: 23.5 % (ref 12.0–46.0)
Lymphs Abs: 1.6 K/uL (ref 0.7–4.0)
MCHC: 33.8 g/dL (ref 30.0–36.0)
MCV: 94.3 fl (ref 78.0–100.0)
Monocytes Absolute: 0.4 K/uL (ref 0.1–1.0)
Monocytes Relative: 6.3 % (ref 3.0–12.0)
Neutro Abs: 4.7 K/uL (ref 1.4–7.7)
Neutrophils Relative %: 67.1 % (ref 43.0–77.0)
Platelets: 229 K/uL (ref 150.0–400.0)
RBC: 3.81 Mil/uL — ABNORMAL LOW (ref 3.87–5.11)
RDW: 14.2 % (ref 11.5–15.5)
WBC: 7 K/uL (ref 4.0–10.5)

## 2024-10-26 LAB — TSH: TSH: 2.11 u[IU]/mL (ref 0.35–5.50)

## 2024-10-26 LAB — LIPID PANEL
Cholesterol: 159 mg/dL (ref 28–200)
HDL: 49.3 mg/dL (ref >39.00)
LDL Cholesterol: 77 mg/dL (ref 10–99)
NonHDL: 109.34
Total CHOL/HDL Ratio: 3
Triglycerides: 161 mg/dL — ABNORMAL HIGH (ref 10.0–149.0)
VLDL: 32.2 mg/dL (ref 0.0–40.0)

## 2024-10-26 LAB — HEPATIC FUNCTION PANEL
ALT: 7 U/L (ref 3–35)
AST: 9 U/L (ref 5–37)
Albumin: 4.3 g/dL (ref 3.5–5.2)
Alkaline Phosphatase: 61 U/L (ref 39–117)
Bilirubin, Direct: 0 mg/dL — ABNORMAL LOW (ref 0.1–0.3)
Total Bilirubin: 0.3 mg/dL (ref 0.2–1.2)
Total Protein: 6.6 g/dL (ref 6.0–8.3)

## 2024-10-26 LAB — BASIC METABOLIC PANEL WITH GFR
BUN: 12 mg/dL (ref 6–23)
CO2: 27 meq/L (ref 19–32)
Calcium: 9.4 mg/dL (ref 8.4–10.5)
Chloride: 106 meq/L (ref 96–112)
Creatinine, Ser: 0.81 mg/dL (ref 0.40–1.20)
GFR: 72.9 mL/min (ref >60.00)
Glucose, Bld: 117 mg/dL — ABNORMAL HIGH (ref 70–99)
Potassium: 3.8 meq/L (ref 3.5–5.1)
Sodium: 142 meq/L (ref 135–145)

## 2024-10-26 LAB — VITAMIN B12: Vitamin B-12: 951 pg/mL — ABNORMAL HIGH (ref 211–911)

## 2024-10-26 LAB — HEMOGLOBIN A1C: Hgb A1c MFr Bld: 5.5 % (ref 4.6–6.5)

## 2024-10-26 MED ORDER — AMLODIPINE BESYLATE 5 MG PO TABS: 5.0000 mg | ORAL_TABLET | Freq: Every day | ORAL | 3 refills | Status: AC

## 2024-10-26 NOTE — Progress Notes (Signed)
 Subjective:    Patient ID: [name], female    DOB: [birth year], 73 y.o.   MRN: [id number]  HPI Here to follow up on issues. She has two concerns. First she has been having occasional brief spells of lightheadedness. No dizzines. Her BP at home averages 110-120/60-70. She also describes a frequent dry tickling cough in her throat. Her GERD is stable. Her neuropathy is stable.    Review of Systems  Constitutional: Negative.   HENT: Negative.    Eyes: Negative.   Respiratory:  Positive for cough.   Cardiovascular: Negative.   Gastrointestinal: Negative.   Genitourinary:  Negative for decreased urine volume, difficulty urinating, dyspareunia, dysuria, enuresis, flank pain, frequency, hematuria, pelvic pain and urgency.  Musculoskeletal: Negative.   Skin: Negative.   Neurological:  Positive for light-headedness. Negative for headaches.  Psychiatric/Behavioral: Negative.         Objective:   Physical Exam Constitutional:      General: She is not in acute distress.    Appearance: She is well-developed. She is obese.  HENT:     Head: Normocephalic and atraumatic.     Right Ear: External ear normal.     Left Ear: External ear normal.     Nose: Nose normal.     Mouth/Throat:     Pharynx: No oropharyngeal exudate.  Eyes:     General: No scleral icterus.    Conjunctiva/sclera: Conjunctivae normal.     Pupils: Pupils are equal, round, and reactive to light.  Neck:     Thyroid : No thyromegaly.     Vascular: No JVD.  Cardiovascular:     Rate and Rhythm: Normal rate and regular rhythm.     Pulses: Normal pulses.     Heart sounds: Normal heart sounds. No murmur heard.    No friction rub. No gallop.  Pulmonary:     Effort: Pulmonary effort is normal. No respiratory distress.     Breath sounds: Normal breath sounds. No wheezing or rales.  Chest:     Chest wall: No tenderness.  Abdominal:     General: Bowel sounds are normal. There is no distension.     Palpations:  Abdomen is soft. There is no mass.     Tenderness: There is no abdominal tenderness. There is no guarding or rebound.  Musculoskeletal:        General: No tenderness. Normal range of motion.     Cervical back: Normal range of motion and neck supple.  Lymphadenopathy:     Cervical: No cervical adenopathy.  Skin:    General: Skin is warm and dry.     Findings: No erythema or rash.  Neurological:     General: No focal deficit present.     Mental Status: She is alert and oriented to person, place, and time.     Cranial Nerves: No cranial nerve deficit.     Motor: No abnormal muscle tone.     Coordination: Coordination normal.     Deep Tendon Reflexes: Reflexes are normal and symmetric. Reflexes normal.  Psychiatric:        Mood and Affect: Mood normal.        Behavior: Behavior normal.        Thought Content: Thought content normal.        Judgment: Judgment normal.           Assessment & Plan:  Her HTN is likely overtreated and she has been having some orthostasis. In addition her cough is  likely a side effect of the Lisinopril . Therefore we will stop the Lisinopril  HCT, and she will start on Amlodipine  5 mg daily. The cough should fade away over the next few weeks. Her neuropathy and GERD are stable. She is past due for a colonoscopy, so we will set this up. She is also last due for a mammogram. Get labs to check lipids, A1c, B12, etc. I personally spent a total of 34 minutes in the care of the patient today including getting/reviewing separately obtained history, performing a medically appropriate exam/evaluation, and placing orders.  Garnette Olmsted, MD

## 2024-11-09 ENCOUNTER — Other Ambulatory Visit: Payer: Self-pay | Admitting: Family Medicine

## 2024-11-20 ENCOUNTER — Ambulatory Visit: Admitting: *Deleted

## 2024-11-20 ENCOUNTER — Telehealth: Payer: Self-pay | Admitting: *Deleted

## 2024-11-20 VITALS — Ht 60.0 in | Wt 184.0 lb

## 2024-11-20 DIAGNOSIS — Z860100 Personal history of colon polyps, unspecified: Secondary | ICD-10-CM

## 2024-11-20 DIAGNOSIS — Z800 Family history of malignant neoplasm of digestive organs: Secondary | ICD-10-CM

## 2024-11-20 MED ORDER — NA SULFATE-K SULFATE-MG SULF 17.5-3.13-1.6 GM/177ML PO SOLN: 1.0000 | Freq: Once | ORAL | 0 refills | Status: AC

## 2024-11-20 NOTE — Telephone Encounter (Signed)
 Spoke with pt advised of Dr Johnny recommendations pt advised to take BP at least twice daily and to keep record. Advised to call the office if symptoms change. Pt voiced understanding

## 2024-11-20 NOTE — Telephone Encounter (Signed)
(  1) what are her latest BP readings?, and (2) did the cough go away?

## 2024-11-20 NOTE — Telephone Encounter (Signed)
 I am glad the cough went away. This was definitely a side effect of the Lisinopril . I think the new medication (Amlodipine ) may be at too high a dose for her. Tell her to take 1/2 tablet (5 mg) daily for 2 weeks and let's see how she does

## 2024-11-20 NOTE — Progress Notes (Signed)
 Pt's name and DOB verified at the beginning of the pre-visit with 2 identifiers  Pt denies any difficulty with ambulating,sitting, laying down or rolling side to side  Pt has no issues moving head neck or swallowing  No egg or soy allergy  known to patient   No issues known to pt with past sedation  No FH of Malignant Hyperthermia  Pt is not on home 02   Pt is not on blood thinners   Pt denies issues with constipation   Pt has frequent issues with constipation RN instructed pt to use Miralax per bottles instructions a week before prep days. Pt states they will  Pt is not on dialysis  Pt denise any abnormal heart rhythms   Pt denies any upcoming cardiac testing   Chart not reviewed by CRNA prior to PV  Visit by phone   Pt states weight is 184 lb    Pt given  both [facility] main # and MD on call # prior to instructions.  Informed pt to come in at the time discussed and is shown on PV instructions.  Pt instructed to use Single[employer]com or GoodRx for a price reduction on prep  Instructed pt where to find PV instructions in My Chart while patient is bringing it up Instructed pt on all aspects of written instructions including med holds clothing to wear and foods to eat and not eat as well as after procedure legal restrictions and to call MD on call if needed.. Pt states understanding. Instructed pt to review instructions again prior to procedure and call main # given if has any questions or any issues. Pt states they will.

## 2024-11-20 NOTE — Telephone Encounter (Signed)
 Her BP is perfect, so I do not the the medication is causing her dizziness. Let's give is a little more time to see if it settles down. If not, come see us 

## 2024-11-20 NOTE — Telephone Encounter (Signed)
 Copied from CRM [id number]. Topic: Clinical - Medical Advice >> Nov 20, 2024  9:23 AM [name] wrote: Reason for CRM: Patient's BP medication was lowered from 12.5mg  to 5mg  and since has been experiencing headaches. Please call pt back to best advise. BP 121/50 C/b: [phone]

## 2024-11-27 ENCOUNTER — Encounter: Payer: Self-pay | Admitting: Pediatrics

## 2024-12-04 NOTE — Progress Notes (Unsigned)
 [facility] History and Physical   Primary Care Physician:  Johnny Garnette LABOR, MD   Reason for Procedure:  History of adenomatous colon polyps, family history of colorectal cancer in multiple person second-degree relatives  Plan:    Colonoscopy   The patient was provided an opportunity to ask questions and all were answered. The patient agreed with the plan.   HPI: [name] is a 74 y.o. female undergoing colonoscopy for colon polyp surveillance.  Patient has a history of adenomatous colon polyps:  2022-3 tubular adenomas 2015-1 tubular adenoma Previous colonoscopies were normal without polyps  Family history of colorectal cancer in both mother age 44 and father age 10 Maternal aunts with colorectal cancer  Patient denies current symptoms of rectal bleeding or change in bowel habits   Past Medical History:  Diagnosis Date   Allergy     Anxiety    Asthma    sees Dr. Reggy Salt   Colon polyp 11/09/2005   Depression    Diverticulosis    GERD (gastroesophageal reflux disease)    Gynecological examination    sees Dr. Nathanel Bunker   Hemorrhoids    HOH (hard of hearing)    Hyperlipemia    Hypertension    IBS (irritable bowel syndrome)     Past Surgical History:  Procedure Laterality Date   CARPAL TUNNEL RELEASE Right 11/09/1978   COLONOSCOPY  06/17/2021   per Dr. Eda, adenimatous polyps, repeat in 3 yrs   DILATION AND CURETTAGE OF UTERUS  11/10/2003   KNEE SURGERY      Prior to Admission medications  Medication Sig Start Date End Date Taking? Authorizing Provider  albuterol  (PROAIR  HFA) 108 (90 Base) MCG/ACT inhaler INHALE 2 PUFFS INTO THE LUNGS EVERY 6 HOURS AS NEEDED FOR WHEEZING. 03/09/16   Salt Reggy D, MD  amLODipine  (NORVASC ) 5 MG tablet Take 1 tablet (5 mg total) by mouth daily. 10/26/24   Johnny Garnette LABOR, MD  aspirin EC 81 MG tablet daily.    [provider]  atorvastatin  (LIPITOR) 10 MG tablet Take 1 tablet (10 mg  total) by mouth daily. 07/31/24   Johnny Garnette LABOR, MD  budesonide -formoterol  (SYMBICORT ) 80-4.5 MCG/ACT inhaler 2 puffs then rinse mouth, twice daily maintenance inhaler 07/31/24   Johnny Garnette LABOR, MD  EPINEPHrine  0.3 mg/0.3 mL IJ SOAJ injection Use as directed for severe allergic reaction 12/03/16   Jeneal Danita Macintosh, MD  fluticasone  (FLONASE ) 50 MCG/ACT nasal spray USE 1 SPRAY IN EACH NOSTRIL TWICE DAILY Patient taking differently: as needed. USE 1 SPRAY IN EACH NOSTRIL TWICE DAILY 12/07/22   Johnny Garnette LABOR, MD  gabapentin  (NEURONTIN ) 100 MG capsule Take 1 capsule (100 mg total) by mouth at bedtime. 07/31/24   Johnny Garnette LABOR, MD  ketoconazole  (NIZORAL ) 2 % cream Apply 1 Application topically 2 (two) times daily as needed for irritation. 07/08/23   Johnny Garnette LABOR, MD  omeprazole  (PRILOSEC) 20 MG capsule Take 1 capsule (20 mg total) by mouth daily. 07/31/24   Johnny Garnette LABOR, MD  potassium chloride  SA (KLOR-CON  M) 20 MEQ tablet Take 1 tablet (20 mEq total) by mouth daily. 07/31/24   Johnny Garnette LABOR, MD  vitamin B-12 (CYANOCOBALAMIN ) 100 MCG tablet Take 100 mcg by mouth daily.    [provider]    Current Outpatient Medications  Medication Sig Dispense Refill   amLODipine  (NORVASC ) 5 MG tablet Take 1 tablet (5 mg total) by mouth daily. 90 tablet 3   aspirin EC 81 MG tablet  daily.     atorvastatin  (LIPITOR) 10 MG tablet Take 1 tablet (10 mg total) by mouth daily. 90 tablet 3   budesonide -formoterol  (SYMBICORT ) 80-4.5 MCG/ACT inhaler 2 puffs then rinse mouth, twice daily maintenance inhaler 3 each 3   gabapentin  (NEURONTIN ) 100 MG capsule Take 1 capsule (100 mg total) by mouth at bedtime. 90 capsule 1   omeprazole  (PRILOSEC) 20 MG capsule Take 1 capsule (20 mg total) by mouth daily. 90 capsule 3   potassium chloride  SA (KLOR-CON  M) 20 MEQ tablet Take 1 tablet (20 mEq total) by mouth daily. 90 tablet 3   pyridOXINE (VITAMIN B6) 25 MG tablet Take 25 mg by mouth daily.     albuterol  (PROAIR   HFA) 108 (90 Base) MCG/ACT inhaler INHALE 2 PUFFS INTO THE LUNGS EVERY 6 HOURS AS NEEDED FOR WHEEZING. 3 Inhaler 3   EPINEPHrine  0.3 mg/0.3 mL IJ SOAJ injection Use as directed for severe allergic reaction 2 Device 1   fluticasone  (FLONASE ) 50 MCG/ACT nasal spray USE 1 SPRAY IN EACH NOSTRIL TWICE DAILY (Patient taking differently: as needed. USE 1 SPRAY IN EACH NOSTRIL TWICE DAILY) 48 g 3   ketoconazole  (NIZORAL ) 2 % cream Apply 1 Application topically 2 (two) times daily as needed for irritation. 30 g 5   vitamin B-12 (CYANOCOBALAMIN ) 100 MCG tablet Take 100 mcg by mouth daily. (Patient not taking: Reported on 12/07/2024)     Current Facility-Administered Medications  Medication Dose Route Frequency Provider Last Rate Last Admin   0.9 %  sodium chloride  infusion  500 mL Intravenous Once Mar[place]ne Ridge, Inocente HERO, MD        Allergies as of 12/07/2024 - Review Complete 12/07/2024  Allergen Reaction Noted   Doxycycline Other (See Comments)    Lisinopril  Cough 11/20/2024    Family History  Problem Relation Age of Onset   Colon cancer Mother 62   Asthma Mother    Colon cancer Father 28   Liver cancer Father    Diabetes Sister    Throat cancer Brother    Colon cancer Maternal Aunt        not sure age of onset   Colon cancer Maternal Aunt        not sure age of onset   Pancreatic cancer Neg Hx    Rectal cancer Neg Hx    Stomach cancer Neg Hx    Allergic rhinitis Neg Hx    Angioedema Neg Hx    Eczema Neg Hx    Colon polyps Neg Hx    Esophageal cancer Neg Hx     Social History   Socioeconomic History   Marital status: Married    Spouse name: Not on file   Number of children: 2   Years of education: Not on file   Highest education level: 12th grade  Occupational History    Employer: [employer]  Tobacco Use   Smoking status: Never   Smokeless tobacco: Never  Vaping Use   Vaping status: Never Used  Substance and Sexual Activity   Alcohol use: Yes    Alcohol/week: 0.0 standard  drinks of alcohol    Comment: occ   Drug use: No   Sexual activity: Not on file  Other Topics Concern   Not on file  Social History Narrative   Daily caffeine    Social Drivers of Health   Tobacco Use: Low Risk (12/07/2024)   Patient History    Smoking Tobacco Use: Never    Smokeless Tobacco Use: Never  Passive Exposure: Not on file  Financial Resource Strain: Low Risk (07/05/2024)   Overall Financial Resource Strain (CARDIA)    Difficulty of Paying Living Expenses: Not hard at all  Food Insecurity: No Food Insecurity (07/05/2024)   Epic    Worried About Programme Researcher, Broadcasting/film/video in the Last Year: Never true    Ran Out of Food in the Last Year: Never true  Transportation Needs: No Transportation Needs (07/05/2024)   Epic    Lack of Transportation (Medical): No    Lack of Transportation (Non-Medical): No  Physical Activity: Sufficiently Active (07/05/2024)   Exercise Vital Sign    Days of Exercise per Week: 5 days    Minutes of Exercise per Session: 40 min  Stress: No Stress Concern Present (07/05/2024)   [employer] of Occupational Health - Occupational Stress Questionnaire    Feeling of Stress: Only a little  Social Connections: Moderately Integrated (07/05/2024)   Social Connection and Isolation Panel    Frequency of Communication with Friends and Family: More than three times a week    Frequency of Social Gatherings with Friends and Family: More than three times a week    Attends Religious Services: Patient declined    Active Member of Clubs or Organizations: Yes    Attends Banker Meetings: 1 to 4 times per year    Marital Status: Married  Catering Manager Violence: Not on file  Depression (PHQ2-9): Low Risk (03/03/2024)   Depression (PHQ2-9)    PHQ-2 Score: 1  Alcohol Screen: Low Risk (07/05/2024)   Alcohol Screen    Last Alcohol Screening Score (AUDIT): 3  Housing: Unknown (07/05/2024)   Epic    Unable to Pay for Housing in the Last Year: No    Number of  Times Moved in the Last Year: Not on file    Homeless in the Last Year: No  Utilities: Not At Risk (09/09/2023)   [facility] Utilities    Threatened with loss of utilities: No  Health Literacy: Not on file    Review of Systems:  All other review of systems negative except as mentioned in the HPI.  Physical Exam: Vital signs BP (!) 160/83   Pulse 76   Temp (!) 97.5 F (36.4 C) (Skin)   Ht 5' (1.524 m)   Wt 184 lb (83.5 kg)   SpO2 96%   BMI 35.94 kg/m   General:   Alert,  Well-developed, well-nourished, pleasant and cooperative in NAD Airway:  Mallampati 2 Lungs:  Clear throughout to auscultation.   Heart:  Regular rate and rhythm; no murmurs, clicks, rubs,  or gallops. Abdomen:  Soft, nontender and nondistended. Normal bowel sounds.   Neuro/Psych:  Normal mood and affect. A and O x 3  Inocente Hausen, MD [facility]

## 2024-12-07 ENCOUNTER — Ambulatory Visit: Admitting: Pediatrics

## 2024-12-07 ENCOUNTER — Encounter: Payer: Self-pay | Admitting: Pediatrics

## 2024-12-07 VITALS — BP 129/62 | HR 91 | Temp 97.5°F | Resp 13 | Ht 60.0 in | Wt 184.0 lb

## 2024-12-07 DIAGNOSIS — K562 Volvulus: Secondary | ICD-10-CM

## 2024-12-07 DIAGNOSIS — K5730 Diverticulosis of large intestine without perforation or abscess without bleeding: Secondary | ICD-10-CM | POA: Diagnosis not present

## 2024-12-07 DIAGNOSIS — K648 Other hemorrhoids: Secondary | ICD-10-CM

## 2024-12-07 DIAGNOSIS — Z800 Family history of malignant neoplasm of digestive organs: Secondary | ICD-10-CM

## 2024-12-07 DIAGNOSIS — D120 Benign neoplasm of cecum: Secondary | ICD-10-CM

## 2024-12-07 DIAGNOSIS — D123 Benign neoplasm of transverse colon: Secondary | ICD-10-CM

## 2024-12-07 DIAGNOSIS — Z860100 Personal history of colon polyps, unspecified: Secondary | ICD-10-CM

## 2024-12-07 DIAGNOSIS — Q439 Congenital malformation of intestine, unspecified: Secondary | ICD-10-CM | POA: Diagnosis not present

## 2024-12-07 DIAGNOSIS — D122 Benign neoplasm of ascending colon: Secondary | ICD-10-CM | POA: Diagnosis not present

## 2024-12-07 DIAGNOSIS — K635 Polyp of colon: Secondary | ICD-10-CM | POA: Diagnosis not present

## 2024-12-07 DIAGNOSIS — Z1211 Encounter for screening for malignant neoplasm of colon: Secondary | ICD-10-CM | POA: Diagnosis not present

## 2024-12-07 DIAGNOSIS — Z860101 Personal history of adenomatous and serrated colon polyps: Secondary | ICD-10-CM | POA: Diagnosis not present

## 2024-12-07 MED ORDER — SODIUM CHLORIDE 0.9 % IV SOLN: 500.0000 mL | Freq: Once | INTRAVENOUS | Status: DC

## 2024-12-07 NOTE — Progress Notes (Signed)
 Pt's states no medical or surgical changes since previsit or office visit.

## 2024-12-07 NOTE — Op Note (Signed)
 [facility] Patient Name: [name] Procedure Date: 12/07/2024 1:29 PM MRN: [id number] Endoscopist: Inocente Hausen , MD, [id number] Age: 74 Referring MD:  Date of Birth: 03-19-1951 Gender: Female Account #: [id number] Procedure:                Colonoscopy Indications:              High risk colon cancer surveillance: Personal                            history of multiple (3 or more) adenomas, Last                            colonoscopy: August 2022, Family history of colon                            cancer in a first-degree relative before age 44                            years, Family history of colon cancer in multiple                            second-degree relatives Medicines:                Monitored Anesthesia Care Procedure:                Pre-Anesthesia Assessment:                           - Prior to the procedure, a History and Physical                            was performed, and patient medications and                            allergies were reviewed. The patient's tolerance of                            previous anesthesia was also reviewed. The risks                            and benefits of the procedure and the sedation                            options and risks were discussed with the patient.                            All questions were answered, and informed consent                            was obtained. Prior Anticoagulants: The patient has                            taken no anticoagulant or antiplatelet agents. ASA  Grade Assessment: II - A patient with mild systemic                            disease. After reviewing the risks and benefits,                            the patient was deemed in satisfactory condition to                            undergo the procedure.                           After obtaining informed consent, the colonoscope                            was passed under direct vision.  Throughout the                            procedure, the patient's blood pressure, pulse, and                            oxygen saturations were monitored continuously. The                            CF [id number] #[id number] was introduced through the anus                            and advanced to the cecum, identified by                            appendiceal orifice and ileocecal valve. The                            colonoscopy was technically difficult and complex                            due to restricted mobility of the colon,                            significant looping and a tortuous colon.                            Successful completion of the procedure was aided by                            using manual pressure and straightening and                            shortening the scope to obtain bowel loop                            reduction. The adult colonoscope was changed to a  pediatric colonoscope due to restricted mobility of                            sigmoid colon. An abdominal binder was used. The                            patient tolerated the procedure well. The quality                            of the bowel preparation was good. The ileocecal                            valve, appendiceal orifice, and rectum were                            photographed. Scope In: 1:55:01 PM Scope Out: 2:16:12 PM Scope Withdrawal Time: 0 hours 11 minutes 43 seconds  Total Procedure Duration: 0 hours 21 minutes 11 seconds  Findings:                 Hemorrhoids were found on perianal exam.                           The digital rectal exam was normal. Pertinent                            negatives include normal sphincter tone and no                            palpable rectal lesions.                           Multiple small-mouthed diverticula were found in                            the sigmoid colon, descending colon and transverse                             colon.                           A 5 mm polyp was found in the transverse colon. The                            polyp was sessile. The polyp was removed with a                            cold snare. Resection and retrieval were complete.                           Two sessile polyps were found in the ascending                            colon and cecum. The polyps were 3 to 4 mm in size.  These polyps were removed with a cold biopsy                            forceps. Resection and retrieval were complete.                           Internal hemorrhoids were found during retroflexion. Complications:            No immediate complications. Estimated blood loss:                            Minimal. Estimated Blood Loss:     Estimated blood loss: Minimal. Impression:               - Hemorrhoids found on perianal exam.                           - Diverticulosis in the sigmoid colon, in the                            descending colon and in the transverse colon.                           - One 5 mm polyp in the transverse colon, removed                            with a cold snare. Resected and retrieved.                           - Two 3 to 4 mm polyps in the ascending colon and                            in the cecum, removed with a cold biopsy forceps.                            Resected and retrieved.                           - Internal hemorrhoids. Recommendation:           - Discharge patient to home (ambulatory).                           - Await pathology results.                           - Repeat colonoscopy for surveillance based on                            pathology results.                           - The findings and recommendations were discussed                            with the patient's family.                           -  Patient has a contact number available for                            emergencies. The signs and symptoms of potential                             delayed complications were discussed with the                            patient. Return to normal activities tomorrow.                            Written discharge instructions were provided to the                            patient. Inocente Hausen, MD 12/07/2024 2:23:32 PM This report has been signed electronically.

## 2024-12-07 NOTE — Progress Notes (Signed)
 Sedate, gd SR, tolerated procedure well, VSS, report to RN

## 2024-12-07 NOTE — Patient Instructions (Signed)
Handouts given on polyps, hemorrhoids and diverticulosis.  YOU HAD AN ENDOSCOPIC PROCEDURE TODAY AT THE [facility]:   Refer to the procedure report that was given to you for any specific questions about what was found during the examination.  If the procedure report does not answer your questions, please call your gastroenterologist to clarify.  If you requested that your care partner not be given the details of your procedure findings, then the procedure report has been included in a sealed envelope for you to review at your convenience later.  YOU SHOULD EXPECT: Some feelings of bloating in the abdomen. Passage of more gas than usual.  Walking can help get rid of the air that was put into your GI tract during the procedure and reduce the bloating. If you had a lower endoscopy (such as a colonoscopy or flexible sigmoidoscopy) you may notice spotting of blood in your stool or on the toilet paper. If you underwent a bowel prep for your procedure, you may not have a normal bowel movement for a few days.  Please Note:  You might notice some irritation and congestion in your nose or some drainage.  This is from the oxygen used during your procedure.  There is no need for concern and it should clear up in a day or so.  SYMPTOMS TO REPORT IMMEDIATELY:  Following lower endoscopy (colonoscopy or flexible sigmoidoscopy):  Excessive amounts of blood in the stool  Significant tenderness or worsening of abdominal pains  Swelling of the abdomen that is new, acute  Fever of 100F or higher  For urgent or emergent issues, a gastroenterologist can be reached at any hour by calling [phone]. Do not use MyChart messaging for urgent concerns.    DIET:  We do recommend a small meal at first, but then you may proceed to your regular diet.  Drink plenty of fluids but you should avoid alcoholic beverages for 24 hours.  ACTIVITY:  You should plan to take it easy for the rest of today and you should  NOT DRIVE or use heavy machinery until tomorrow (because of the sedation medicines used during the test).    FOLLOW UP: Our staff will call the number listed on your records the next business day following your procedure.  We will call around 7:15- 8:00 am to check on you and address any questions or concerns that you may have regarding the information given to you following your procedure. If we do not reach you, we will leave a message.     If any biopsies were taken you will be contacted by phone or by letter within the next 1-3 weeks.  Please call us at [phone] if you have not heard about the biopsies in 3 weeks.    SIGNATURES/CONFIDENTIALITY: You and/or your care partner have signed paperwork which will be entered into your electronic medical record.  These signatures attest to the fact that that the information above on your After Visit Summary has been reviewed and is understood.  Full responsibility of the confidentiality of this discharge information lies with you and/or your care-partner. 

## 2024-12-07 NOTE — Progress Notes (Signed)
 Called to room to assist during endoscopic procedure.  Patient ID and intended procedure confirmed with present staff. Received instructions for my participation in the procedure from the performing physician.

## 2024-12-08 ENCOUNTER — Telehealth: Payer: Self-pay

## 2024-12-08 NOTE — Telephone Encounter (Signed)
 Follow up call to pt, lm for pt to call if having any difficulty with normal activities or eating and drinking.  Also to call if any other questions or concerns.

## 2024-12-14 ENCOUNTER — Ambulatory Visit: Payer: Self-pay | Admitting: Pediatrics

## 2024-12-14 LAB — SURGICAL PATHOLOGY
